# Patient Record
Sex: Female | Born: 1995 | Race: Black or African American | Hispanic: No | Marital: Single | Smoking: Never smoker
Health system: Southern US, Community
[De-identification: ages and names within clinical notes are randomized; demographics above are authoritative.]

## PROBLEM LIST (undated history)

## (undated) ENCOUNTER — Inpatient Hospital Stay (HOSPITAL_COMMUNITY): Payer: Self-pay

## (undated) DIAGNOSIS — A749 Chlamydial infection, unspecified: Secondary | ICD-10-CM

## (undated) DIAGNOSIS — Z789 Other specified health status: Secondary | ICD-10-CM

## (undated) HISTORY — DX: Chlamydial infection, unspecified: A74.9

## (undated) HISTORY — DX: Other specified health status: Z78.9

## (undated) HISTORY — PX: NO PAST SURGERIES: SHX2092

## (undated) HISTORY — PX: INDUCED ABORTION: SHX677

---
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
## 2015-01-01 ENCOUNTER — Telehealth: Payer: Self-pay | Admitting: *Deleted

## 2015-01-01 NOTE — Telephone Encounter (Signed)
Patient has question about nasal pump. 4:58 Call to patient- she has a cold and wants to use nasal spray- active ingredient- Oxymetazoline. Per Dr Jodi Mourning- he called the pharmacy and same as Afrin. Ok to use for 72 hours or less. Patient informed.

## 2015-01-12 ENCOUNTER — Encounter (HOSPITAL_COMMUNITY): Payer: Self-pay | Admitting: *Deleted

## 2015-01-12 ENCOUNTER — Inpatient Hospital Stay (HOSPITAL_COMMUNITY)
Admission: AD | Admit: 2015-01-12 | Discharge: 2015-01-12 | Disposition: A | Discharge status: Home / Self Care | Payer: Medicaid Other | Source: Ambulatory Visit | Attending: Obstetrics | Admitting: Obstetrics

## 2015-01-12 ENCOUNTER — Inpatient Hospital Stay (HOSPITAL_COMMUNITY): Payer: Medicaid Other

## 2015-01-12 DIAGNOSIS — Z3A26 26 weeks gestation of pregnancy: Secondary | ICD-10-CM | POA: Insufficient documentation

## 2015-01-12 DIAGNOSIS — W010XXA Fall on same level from slipping, tripping and stumbling without subsequent striking against object, initial encounter: Secondary | ICD-10-CM | POA: Insufficient documentation

## 2015-01-12 DIAGNOSIS — O9A212 Injury, poisoning and certain other consequences of external causes complicating pregnancy, second trimester: Secondary | ICD-10-CM | POA: Diagnosis not present

## 2015-01-12 DIAGNOSIS — W1809XA Striking against other object with subsequent fall, initial encounter: Secondary | ICD-10-CM | POA: Diagnosis not present

## 2015-01-12 DIAGNOSIS — Y929 Unspecified place or not applicable: Secondary | ICD-10-CM | POA: Insufficient documentation

## 2015-01-12 DIAGNOSIS — S3991XA Unspecified injury of abdomen, initial encounter: Secondary | ICD-10-CM | POA: Diagnosis not present

## 2015-01-12 DIAGNOSIS — O26892 Other specified pregnancy related conditions, second trimester: Secondary | ICD-10-CM | POA: Diagnosis present

## 2015-01-12 NOTE — Progress Notes (Signed)
Marlou Porch CNM in to see pt and discuss d/c plan. EFM removed per CNM

## 2015-01-12 NOTE — MAU Provider Note (Signed)
Chief Complaint:  Fall   First Provider Initiated Contact with Patient 01/12/15 0157     HPI: Erin French is a 19 y.o. G2P0010 at [redacted]w[redacted]d who presents to maternity admissions reporting tripping and falling on her abdomen and 12:30 AM while unpacking her car. States she landed directly on her abdomen on the grass. Was concerned because she didn't feel the baby move for the next hour. Positive fetal movement since arriving in MAU. No associated vaginal bleeding or contractions. Reports tenderness of right knee and fingers of right hand since fall. Normal range of motion. Able to ambulate normally.  O+  Past Medical History: Past Medical History  Diagnosis Date  . Chlamydia 4/12  . Medical history non-contributory     Past obstetric history: OB History  Gravida Para Term Preterm AB SAB TAB Ectopic Multiple Living  2    1     0    # Outcome Date GA Lbr Len/2nd Weight Sex Delivery Anes PTL Lv  2 Current           1 AB               Past Surgical History: Past Surgical History  Procedure Laterality Date  . No past surgeries    . Induced abortion       Family History: Family History  Problem Relation Age of Onset  . Hypertension Father   . Hypertension Maternal Grandmother   . Schizophrenia Mother     Social History: Social History  Substance Use Topics  . Smoking status: Never Smoker   . Smokeless tobacco: Never Used  . Alcohol Use: No    Allergies: No Known Allergies  Meds:  Prescriptions prior to admission  Medication Sig Dispense Refill Last Dose  . butalbital-acetaminophen-caffeine (FIORICET) 50-325-40 MG per tablet Take 1-2 tablets by mouth every 6 (six) hours as needed for headache. 45 tablet 5 Past Month at Unknown time  . Prenat w/o A-FeCbn-DSS-FA-DHA (CITRANATAL HARMONY) 30-1-260 MG CAPS Take 1 tablet by mouth at bedtime. 30 capsule 12 01/11/2015 at Unknown time  . fluconazole (DIFLUCAN) 100 MG tablet Take 1 tablet (100 mg total) by mouth once. Repeat dose  in 48-72 hour. 3 tablet 0   . metroNIDAZOLE (FLAGYL) 500 MG tablet Take 1 tablet (500 mg total) by mouth 2 (two) times daily. 14 tablet 0   . ondansetron (ZOFRAN) 4 MG tablet Take 1 tablet (4 mg total) by mouth daily as needed. 30 tablet 1   . Prenatal Vit-Fe Fumarate-FA (PRENATAL VITAMINS PLUS PO) Take by mouth.   Taking  . terconazole (TERAZOL 7) 0.4 % vaginal cream Place 1 applicator vaginally at bedtime. 45 g 0     I have reviewed patient's Past Medical Hx, Surgical Hx, Family Hx, Social Hx, medications and allergies.   ROS:  Review of Systems  Gastrointestinal: Negative for abdominal pain.  Genitourinary: Negative for vaginal discharge.       Negative for leaking of fluid. Positive fetal movement  Musculoskeletal:       Right knee and right hand pain  Neurological: Negative for dizziness.    Physical Exam   Patient Vitals for the past 24 hrs:  BP Temp Pulse Resp Height Weight  01/12/15 0123 137/79 mmHg 98.3 F (36.8 C) 106 18 5\' 5"  (1.651 m) 142 lb 12.8 oz (64.774 kg)   Constitutional: Well-developed, well-nourished female in no acute distress.  Cardiovascular: normal rate Respiratory: normal effort GI: Abd soft, non-tender, gravid appropriate for gestational age. No  bruising or abrasions. MS: Right knee and fingers of right hand mildly tender. No edema, normal ROM Neurologic: Alert and oriented x 4.  GU: Deferred    FHT:  Baseline 150 , moderate variability, accelerations present, no decelerations Contractions: None   Labs: No results found for this or any previous visit (from the past 24 hour(s)).  Imaging:  No evidence of abruption. AFI normal.   MAU Course: OB Limited to evaluate placenta previa.  Discussed fall on abd, [place], FHR tracing w/ Dr. Ruthann Cancer. D/C pt w/ abruption precautions.   MDM: 19 year-old female at 26.[redacted] weeks gestation S/P fall w/ out evidence of abruption. FHR reassuring for gestational age.   Assessment: 1. Traumatic injury during  pregnancy in second trimester     Plan: Discharge home in stable condition.  Abruption precautions. Preterm labor precautions and fetal kick counts Comfort measures.      Follow-up Information    Follow up with [facility] On 01/24/2015.   Why:  Routine prenatal visit   Contact information:   [address]  [place] [phone]      Follow up with [facility].   Why:  As needed in emergencies (vaginal bleeding, contractions)   Contact information:   [id number] [place] [phone]        Medication List    STOP taking these medications        fluconazole 100 MG tablet  Commonly known as:  DIFLUCAN     metroNIDAZOLE 500 MG tablet  Commonly known as:  FLAGYL     ondansetron 4 MG tablet  Commonly known as:  ZOFRAN     terconazole 0.4 % vaginal cream  Commonly known as:  TERAZOL 7      TAKE these medications        butalbital-acetaminophen-caffeine 50-325-40 MG tablet  Commonly known as:  FIORICET  Take 1-2 tablets by mouth every 6 (six) hours as needed for headache.     CITRANATAL HARMONY 30-1-260 MG Caps  Take 1 tablet by mouth at bedtime.     PRENATAL VITAMINS PLUS PO  Take by mouth.        [place], CNM 01/12/2015 3:12 AM

## 2015-01-12 NOTE — Discharge Instructions (Signed)
What Do I Need to Know About Injuries During Pregnancy? °Injuries can happen during pregnancy. Minor falls and accidents usually do not harm you or your baby. However, any injury should be reported to your doctor. °WHAT CAN I DO TO PROTECT MYSELF FROM INJURIES? °· Remove rugs and loose objects on the floor. °· Wear comfortable shoes that have a good grip. Do not wear high-heeled shoes. °· Always wear your seat belt. The lap belt should be below your belly. Always practice safe driving. °· Do not ride on a motorcycle. °· Do not participate in high-impact activities or sports. °· Avoid: °¨ Walking on wet or slippery floors. °¨ Fires. °¨ Starting fires. °¨ Lifting heavy pots of boiling or hot liquids. °¨ Fixing electrical problems. °· Only take medicine as told by your doctor. °· Know your blood type and the blood type of the baby's father. °· Call your local emergency services (911 in the [place] if you are a victim of domestic violence or assault. For help and support, contact the National Domestic Violence Hotline. °WHEN SHOULD I GET HELP RIGHT AWAY? °· You fall on your belly or have any high-impact accident or injury. °· You have been a victim of domestic violence or any kind of violence. °· You have been in a car accident. °· You have bleeding from your vagina. °· Fluid is leaking from your vagina. °· You start to have belly cramping (contractions) or pain. °· You feel weak or pass out (faint). °· You start to throw up (vomit) after an injury. °· You have been burned. °· You have a stiff neck or neck pain. °· You get a headache or have vision problems after an injury. °· You do not feel the baby move or the baby is not moving as much as normal. °  °This information is not intended to replace advice given to you by your health care provider. Make sure you discuss any questions you have with your health care provider. °  °Document Released: 03/14/2010 Document Revised: 03/02/2014 Document Reviewed:  11/16/2012 °Elsevier Interactive Patient Education ©2016 [employer] ° °

## 2015-01-12 NOTE — MAU Note (Signed)
About 0030 I tripped and fell on my stomach. I fell in the grass but onto my stomach. HAve not felt FM since the fall. Denies LOF or bleeding

## 2015-01-12 NOTE — Progress Notes (Signed)
V. Smith CNM in to discuss test results and d/c plan. Written and verbal d/c instructions given and understanding voiced. 

## 2015-01-12 NOTE — Progress Notes (Signed)
V.SMith CNM in to see pt

## 2015-01-22 ENCOUNTER — Other Ambulatory Visit: Payer: Self-pay | Admitting: Certified Nurse Midwife

## 2015-01-24 ENCOUNTER — Ambulatory Visit (INDEPENDENT_AMBULATORY_CARE_PROVIDER_SITE_OTHER): Payer: Medicaid Other | Admitting: Certified Nurse Midwife

## 2015-01-24 ENCOUNTER — Other Ambulatory Visit: Payer: Medicaid Other

## 2015-01-24 VITALS — BP 122/76 | HR 94 | Temp 98.1°F | Wt 147.0 lb

## 2015-01-24 DIAGNOSIS — Z3403 Encounter for supervision of normal first pregnancy, third trimester: Secondary | ICD-10-CM

## 2015-01-24 LAB — POCT URINALYSIS DIPSTICK
Bilirubin, UA: NEGATIVE
GLUCOSE UA: NEGATIVE
Ketones, UA: NEGATIVE
Leukocytes, UA: NEGATIVE
NITRITE UA: NEGATIVE
Protein, UA: NEGATIVE
RBC UA: 50
SPEC GRAV UA: 1.01
UROBILINOGEN UA: NEGATIVE
pH, UA: 7

## 2015-01-24 LAB — CBC
HCT: 29.2 % — ABNORMAL LOW (ref 36.0–46.0)
HEMOGLOBIN: 9.6 g/dL — AB (ref 12.0–15.0)
MCH: 28.3 pg (ref 26.0–34.0)
MCHC: 32.9 g/dL (ref 30.0–36.0)
MCV: 86.1 fL (ref 78.0–100.0)
MPV: 8.8 fL (ref 8.6–12.4)
Platelets: 295 10*3/uL (ref 150–400)
RBC: 3.39 MIL/uL — AB (ref 3.87–5.11)
RDW: 13.7 % (ref 11.5–15.5)
WBC: 5.1 10*3/uL (ref 4.0–10.5)

## 2015-01-24 LAB — RPR

## 2015-01-24 NOTE — Progress Notes (Signed)
Subjective:    Erin French is a 19 y.o. female being seen today for her obstetrical visit. She is at [redacted]w[redacted]d gestation. Patient reports no complaints. Fetal movement: normal.  Problem List Items Addressed This Visit    None    Visit Diagnoses    Encounter for supervision of normal first pregnancy in third trimester    -  Primary    Relevant Orders    POCT urinalysis dipstick (Completed)    Glucose Tolerance, 2 Hours w/1 Hour    CBC    HIV antibody    RPR      There are no active problems to display for this patient.  Objective:    BP 122/76 mmHg  Pulse 94  Temp(Src) 98.1 F (36.7 C)  Wt 147 lb (66.679 kg)  LMP 07/04/2014 FHT:  150 BPM  Uterine Size: size equals dates  Presentation: cephalic     Assessment:    Pregnancy @ [redacted]w[redacted]d weeks   Doing well.  Plan:     labs reviewed, problem list updated Consent signed. GBS planning TDAP offered  Rhogam given for RH negative Pediatrician: discussed. Infant feeding: plans to breastfeed. Maternity leave: discussed. Cigarette smoking: never smoked. Orders Placed This Encounter  Procedures  . Glucose Tolerance, 2 Hours w/1 Hour  . CBC  . HIV antibody  . RPR  . POCT urinalysis dipstick   No orders of the defined types were placed in this encounter.   Follow up in 2 Weeks.

## 2015-01-25 ENCOUNTER — Other Ambulatory Visit: Payer: Self-pay | Admitting: Certified Nurse Midwife

## 2015-01-25 DIAGNOSIS — O99013 Anemia complicating pregnancy, third trimester: Secondary | ICD-10-CM

## 2015-01-25 LAB — GLUCOSE TOLERANCE, 2 HOURS W/ 1HR
GLUCOSE, 2 HOUR: 82 mg/dL (ref 70–139)
Glucose, 1 hour: 93 mg/dL (ref 70–170)
Glucose, Fasting: 72 mg/dL (ref 65–99)

## 2015-01-25 LAB — HIV ANTIBODY (ROUTINE TESTING W REFLEX): HIV: NONREACTIVE

## 2015-01-25 MED ORDER — FERRALET 90 90-1 MG PO TABS: 1.0000 | ORAL_TABLET | Freq: Every day | ORAL | Status: DC

## 2015-01-27 ENCOUNTER — Emergency Department (HOSPITAL_COMMUNITY)
Admission: EM | Admit: 2015-01-27 | Discharge: 2015-01-27 | Disposition: A | Discharge status: Home / Self Care | Payer: No Typology Code available for payment source | Attending: Emergency Medicine | Admitting: Emergency Medicine

## 2015-01-27 ENCOUNTER — Encounter (HOSPITAL_COMMUNITY): Payer: Self-pay | Admitting: Emergency Medicine

## 2015-01-27 DIAGNOSIS — Y9389 Activity, other specified: Secondary | ICD-10-CM | POA: Diagnosis not present

## 2015-01-27 DIAGNOSIS — Y92410 Unspecified street and highway as the place of occurrence of the external cause: Secondary | ICD-10-CM | POA: Diagnosis not present

## 2015-01-27 DIAGNOSIS — S3991XA Unspecified injury of abdomen, initial encounter: Secondary | ICD-10-CM | POA: Diagnosis not present

## 2015-01-27 DIAGNOSIS — S3992XA Unspecified injury of lower back, initial encounter: Secondary | ICD-10-CM | POA: Insufficient documentation

## 2015-01-27 DIAGNOSIS — Y998 Other external cause status: Secondary | ICD-10-CM | POA: Diagnosis not present

## 2015-01-27 DIAGNOSIS — R1031 Right lower quadrant pain: Secondary | ICD-10-CM

## 2015-01-27 DIAGNOSIS — Z79899 Other long term (current) drug therapy: Secondary | ICD-10-CM | POA: Diagnosis not present

## 2015-01-27 DIAGNOSIS — M545 Low back pain, unspecified: Secondary | ICD-10-CM

## 2015-01-27 DIAGNOSIS — Z8619 Personal history of other infectious and parasitic diseases: Secondary | ICD-10-CM | POA: Diagnosis not present

## 2015-01-27 LAB — CBC WITH DIFFERENTIAL/PLATELET
BASOS ABS: 0 10*3/uL (ref 0.0–0.1)
Basophils Relative: 0 %
EOS ABS: 0 10*3/uL (ref 0.0–0.7)
EOS PCT: 0 %
HCT: 29.3 % — ABNORMAL LOW (ref 36.0–46.0)
HEMOGLOBIN: 9.5 g/dL — AB (ref 12.0–15.0)
LYMPHS PCT: 32 %
Lymphs Abs: 1.7 10*3/uL (ref 0.7–4.0)
MCH: 27.8 pg (ref 26.0–34.0)
MCHC: 32.4 g/dL (ref 30.0–36.0)
MCV: 85.7 fL (ref 78.0–100.0)
Monocytes Absolute: 0.3 10*3/uL (ref 0.1–1.0)
Monocytes Relative: 6 %
NEUTROS PCT: 62 %
Neutro Abs: 3.2 10*3/uL (ref 1.7–7.7)
PLATELETS: 298 10*3/uL (ref 150–400)
RBC: 3.42 MIL/uL — AB (ref 3.87–5.11)
RDW: 12.4 % (ref 11.5–15.5)
WBC: 5.2 10*3/uL (ref 4.0–10.5)

## 2015-01-27 LAB — COMPREHENSIVE METABOLIC PANEL
ALK PHOS: 94 U/L (ref 38–126)
ALT: 16 U/L (ref 14–54)
AST: 25 U/L (ref 15–41)
Albumin: 3.1 g/dL — ABNORMAL LOW (ref 3.5–5.0)
Anion gap: 7 (ref 5–15)
BUN: 7 mg/dL (ref 6–20)
CHLORIDE: 105 mmol/L (ref 101–111)
CO2: 23 mmol/L (ref 22–32)
CREATININE: 0.72 mg/dL (ref 0.44–1.00)
Calcium: 9.5 mg/dL (ref 8.9–10.3)
GFR calc Af Amer: >60 mL/min (ref >60)
GFR calc non Af Amer: >60 mL/min (ref >60)
GLUCOSE: 92 mg/dL (ref 65–99)
Potassium: 3.6 mmol/L (ref 3.5–5.1)
SODIUM: 135 mmol/L (ref 135–145)
Total Bilirubin: 0.3 mg/dL (ref 0.3–1.2)
Total Protein: 7 g/dL (ref 6.5–8.1)

## 2015-01-27 LAB — ABO/RH: ABO/RH(D): O POS

## 2015-01-27 LAB — LIPASE, BLOOD: Lipase: 25 U/L (ref 11–51)

## 2015-01-27 MED ORDER — LACTATED RINGERS IV BOLUS (SEPSIS)
1000.0000 mL | Freq: Once | INTRAVENOUS | Status: AC
  Administered 2015-01-27: 1000 mL via INTRAVENOUS

## 2015-01-27 MED ORDER — TERBUTALINE SULFATE 1 MG/ML IJ SOLN
0.2500 mg | Freq: Once | INTRAMUSCULAR | Status: AC
Start: 2015-01-27 — End: 2015-01-27
  Administered 2015-01-27: 0.25 mg via SUBCUTANEOUS
  Filled 2015-01-27: qty 1

## 2015-01-27 NOTE — Progress Notes (Signed)
NST reactive with no contractions noted; patient cleared obstetrically; ED MD notified
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
## 2015-01-27 NOTE — ED Notes (Signed)
Sister brought over from peds by [place], Therapist, sports.

## 2015-01-27 NOTE — ED Notes (Signed)
OB RR called on the way

## 2015-01-27 NOTE — ED Notes (Signed)
Rapid OB notified and enroute
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
## 2015-01-28 ENCOUNTER — Encounter (HOSPITAL_COMMUNITY): Payer: Self-pay | Admitting: *Deleted

## 2015-01-28 ENCOUNTER — Inpatient Hospital Stay (HOSPITAL_COMMUNITY): Payer: Medicaid Other

## 2015-01-28 ENCOUNTER — Ambulatory Visit (INDEPENDENT_AMBULATORY_CARE_PROVIDER_SITE_OTHER): Payer: Medicaid Other | Admitting: Certified Nurse Midwife

## 2015-01-28 ENCOUNTER — Inpatient Hospital Stay (HOSPITAL_COMMUNITY)
Admission: AD | Admit: 2015-01-28 | Discharge: 2015-01-28 | Disposition: A | Discharge status: Home / Self Care | Payer: Medicaid Other | Source: Ambulatory Visit | Attending: Obstetrics | Admitting: Obstetrics

## 2015-01-28 VITALS — BP 131/76 | HR 106 | Temp 98.5°F | Wt 149.0 lb

## 2015-01-28 DIAGNOSIS — Z3403 Encounter for supervision of normal first pregnancy, third trimester: Secondary | ICD-10-CM

## 2015-01-28 DIAGNOSIS — O9A213 Injury, poisoning and certain other consequences of external causes complicating pregnancy, third trimester: Secondary | ICD-10-CM | POA: Diagnosis not present

## 2015-01-28 DIAGNOSIS — Z3A28 28 weeks gestation of pregnancy: Secondary | ICD-10-CM | POA: Diagnosis not present

## 2015-01-28 DIAGNOSIS — R109 Unspecified abdominal pain: Secondary | ICD-10-CM | POA: Insufficient documentation

## 2015-01-28 DIAGNOSIS — O26892 Other specified pregnancy related conditions, second trimester: Secondary | ICD-10-CM | POA: Insufficient documentation

## 2015-01-28 DIAGNOSIS — T1490 Injury, unspecified: Secondary | ICD-10-CM

## 2015-01-28 DIAGNOSIS — O26899 Other specified pregnancy related conditions, unspecified trimester: Secondary | ICD-10-CM

## 2015-01-28 DIAGNOSIS — O9989 Other specified diseases and conditions complicating pregnancy, childbirth and the puerperium: Secondary | ICD-10-CM

## 2015-01-28 LAB — KLEIHAUER-BETKE STAIN
# VIALS RHIG: 1
Fetal Cells %: 0 %
Quantitation Fetal Hemoglobin: 0 mL

## 2015-01-28 MED ORDER — VITAFOL FE+ 90-1-200 & 50 MG PO CPPK: 1.0000 | ORAL_CAPSULE | Freq: Every day | ORAL | Status: DC
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
## 2015-01-28 NOTE — MAU Note (Signed)
Urine in lab 
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
## 2015-02-07 ENCOUNTER — Ambulatory Visit (INDEPENDENT_AMBULATORY_CARE_PROVIDER_SITE_OTHER): Payer: Medicaid Other | Admitting: Certified Nurse Midwife

## 2015-02-07 VITALS — BP 116/78 | HR 97 | Temp 98.6°F | Wt 148.0 lb

## 2015-02-07 DIAGNOSIS — J0110 Acute frontal sinusitis, unspecified: Secondary | ICD-10-CM

## 2015-02-07 DIAGNOSIS — J069 Acute upper respiratory infection, unspecified: Secondary | ICD-10-CM

## 2015-02-07 DIAGNOSIS — Z3403 Encounter for supervision of normal first pregnancy, third trimester: Secondary | ICD-10-CM

## 2015-02-07 LAB — POCT URINALYSIS DIPSTICK
Bilirubin, UA: NEGATIVE
GLUCOSE UA: NEGATIVE
Ketones, UA: NEGATIVE
Leukocytes, UA: NEGATIVE
NITRITE UA: NEGATIVE
PH UA: 7
Protein, UA: NEGATIVE
RBC UA: NEGATIVE
UROBILINOGEN UA: NEGATIVE

## 2015-02-07 MED ORDER — HYDROCOD POLST-CPM POLST ER 10-8 MG/5ML PO SUER: 5.0000 mL | Freq: Two times a day (BID) | ORAL | Status: DC

## 2015-02-07 MED ORDER — PSEUDOEPHED-APAP-GUAIFENESIN 30-325-200 MG PO TABS: 2.0000 | ORAL_TABLET | Freq: Two times a day (BID) | ORAL | Status: DC

## 2015-02-07 MED ORDER — AZITHROMYCIN 250 MG PO TABS: ORAL_TABLET | ORAL | Status: DC

## 2015-02-07 NOTE — Progress Notes (Signed)
Subjective:    Erin French is a 19 y.o. female being seen today for her obstetrical visit. She is at [redacted]w[redacted]d gestation. Patient reports no bleeding, no contractions, no cramping, no leaking and URI symptoms: +chills, denies fever, cough and congestion for 3 weeks, + sinus pressure, + coughing with yellow sputum production.  Seems that the URI is getting worse, increased coughing at night, + post nasal drip. Fetal movement: normal.  Problem List Items Addressed This Visit    None    Visit Diagnoses    Encounter for supervision of normal first pregnancy in third trimester    -  Primary    Relevant Orders    POCT urinalysis dipstick (Completed)    URI (upper respiratory infection)        Relevant Medications    Pseudoephed-APAP-Guaifenesin 30-325-200 MG TABS    azithromycin (ZITHROMAX Z-PAK) 250 MG tablet    chlorpheniramine-HYDROcodone (TUSSIONEX PENNKINETIC ER) 10-8 MG/5ML SUER    Acute frontal sinusitis, recurrence not specified        Relevant Medications    Pseudoephed-APAP-Guaifenesin 30-325-200 MG TABS    azithromycin (ZITHROMAX Z-PAK) 250 MG tablet    chlorpheniramine-HYDROcodone (TUSSIONEX PENNKINETIC ER) 10-8 MG/5ML SUER      There are no active problems to display for this patient.  Objective:    BP 116/78 mmHg  Pulse 97  Temp(Src) 98.6 F (37 C)  Wt 148 lb (67.132 kg)  LMP 07/04/2014 FHT:  145-150 BPM  Uterine Size: size equals dates  Presentation: cephalic     Assessment:    Pregnancy @ [redacted]w[redacted]d weeks   URI  Acute Sinusitis  Plan:     labs reviewed, problem list updated Consent signed. GBS planning TDAP offered  Rhogam given for RH negative Pediatrician: discussed. Infant feeding: plans to breastfeed. Maternity leave: discussed. Cigarette smoking: never smoked. Orders Placed This Encounter  Procedures  . POCT urinalysis dipstick   Meds ordered this encounter  Medications  . Pseudoephed-APAP-Guaifenesin 30-325-200 MG TABS    Sig: Take 2 tablets by  mouth every 12 (twelve) hours.    Dispense:  56 each    Refill:  0  . azithromycin (ZITHROMAX Z-PAK) 250 MG tablet    Sig: Take 2 tablets once a day for five days.    Dispense:  10 each    Refill:  0  . chlorpheniramine-HYDROcodone (TUSSIONEX PENNKINETIC ER) 10-8 MG/5ML SUER    Sig: Take 5 mLs by mouth 2 (two) times daily.    Dispense:  140 mL    Refill:  0   Follow up in 2 Weeks.

## 2015-02-07 NOTE — Progress Notes (Signed)
Pt is doing well.

## 2015-02-21 ENCOUNTER — Ambulatory Visit (INDEPENDENT_AMBULATORY_CARE_PROVIDER_SITE_OTHER): Payer: Medicaid Other | Admitting: Certified Nurse Midwife

## 2015-02-21 VITALS — BP 120/79 | HR 107 | Temp 98.4°F | Wt 152.0 lb

## 2015-02-21 DIAGNOSIS — Z3403 Encounter for supervision of normal first pregnancy, third trimester: Secondary | ICD-10-CM

## 2015-02-21 LAB — POCT URINALYSIS DIPSTICK
Bilirubin, UA: NEGATIVE
Blood, UA: 250
Glucose, UA: NEGATIVE
KETONES UA: NEGATIVE
NITRITE UA: NEGATIVE
PH UA: 7
PROTEIN UA: NEGATIVE
Spec Grav, UA: 1.01
Urobilinogen, UA: NEGATIVE

## 2015-02-21 NOTE — Progress Notes (Signed)
Subjective:    Erin French is a 19 y.o. female being seen today for her obstetrical visit. She is at [redacted]w[redacted]d gestation. Patient reports no complaints. Fetal movement: normal.  Problem List Items Addressed This Visit    None    Visit Diagnoses    Encounter for supervision of normal first pregnancy in third trimester    -  Primary    Relevant Orders    POCT urinalysis dipstick      There are no active problems to display for this patient.  Objective:    BP 120/79 mmHg  Pulse 107  Temp(Src) 98.4 F (36.9 C)  Wt 152 lb (68.947 kg)  LMP 07/04/2014 FHT:  135 BPM  Uterine Size: size equals dates  Presentation: cephalic     Assessment:    Pregnancy @ [redacted]w[redacted]d weeks   doing well  Plan:     labs reviewed, problem list updated Consent signed. GBS planning TDAP offered  Rhogam given for RH negative Pediatrician: discussed. Infant feeding: plans to breastfeed. Maternity leave: discussed. Cigarette smoking: never smoked. Orders Placed This Encounter  Procedures  . POCT urinalysis dipstick   No orders of the defined types were placed in this encounter.   Follow up in 2 Weeks.

## 2015-02-24 NOTE — L&D Delivery Note (Signed)
Delivery Note At 8:45 PM a viable female was delivered via Vaginal, Spontaneous Delivery (Presentation: ; Left Occiput Anterior).  APGAR: 8-9, ; weight: 3110 grams .   Placenta status: Intact , .  Cord: 3 vessels with the following complications: None.  Cord pH: none  Anesthesia: None  Episiotomy: None Lacerations: None Suture Repair: none Est. Blood Loss (mL):  350  Mom to postpartum.  Baby to Couplet care / Skin to Skin.  HARPER,CHARLES A 04/12/2015, 9:24 PM

## 2015-03-07 ENCOUNTER — Ambulatory Visit (INDEPENDENT_AMBULATORY_CARE_PROVIDER_SITE_OTHER): Payer: Medicaid Other | Admitting: Certified Nurse Midwife

## 2015-03-07 VITALS — BP 112/77 | HR 99 | Temp 98.6°F | Wt 155.0 lb

## 2015-03-07 DIAGNOSIS — Z3403 Encounter for supervision of normal first pregnancy, third trimester: Secondary | ICD-10-CM

## 2015-03-07 LAB — POCT URINALYSIS DIPSTICK
Bilirubin, UA: NEGATIVE
GLUCOSE UA: NEGATIVE
KETONES UA: NEGATIVE
Nitrite, UA: NEGATIVE
PROTEIN UA: NEGATIVE
SPEC GRAV UA: 1.015
Urobilinogen, UA: NEGATIVE
pH, UA: 7

## 2015-03-07 NOTE — Progress Notes (Signed)
Subjective:    Erin French is a 20 y.o. female being seen today for her obstetrical visit. She is at [redacted]w[redacted]d gestation. Patient reports no complaints. Fetal movement: normal.  Problem List Items Addressed This Visit    None    Visit Diagnoses    Encounter for supervision of normal first pregnancy in third trimester    -  Primary    Relevant Orders    POCT urinalysis dipstick (Completed)      There are no active problems to display for this patient.  Objective:    BP 112/77 mmHg  Pulse 99  Temp(Src) 98.6 F (37 C)  Wt 155 lb (70.308 kg)  LMP 07/04/2014 FHT:  145 BPM  Uterine Size: size equals dates  Presentation: cephalic     Assessment:    Pregnancy @ [redacted]w[redacted]d weeks   Doing well.   Plan:     labs reviewed, problem list updated Consent signed. GBS planning TDAP offered  Rhogam given for RH negative Pediatrician: discussed. Infant feeding: plans to breastfeed. Maternity leave: discussed. Cigarette smoking: never smoked. Orders Placed This Encounter  Procedures  . POCT urinalysis dipstick   No orders of the defined types were placed in this encounter.   Follow up in 2 Weeks.

## 2015-03-22 ENCOUNTER — Ambulatory Visit (INDEPENDENT_AMBULATORY_CARE_PROVIDER_SITE_OTHER): Payer: Medicaid Other | Admitting: Certified Nurse Midwife

## 2015-03-22 VITALS — BP 131/82 | HR 89 | Temp 98.4°F | Wt 159.0 lb

## 2015-03-22 DIAGNOSIS — Z3403 Encounter for supervision of normal first pregnancy, third trimester: Secondary | ICD-10-CM

## 2015-03-22 LAB — CBC WITH DIFFERENTIAL/PLATELET
Basophils Absolute: 0 10*3/uL (ref 0.0–0.1)
Basophils Relative: 0 % (ref 0–1)
EOS ABS: 0 10*3/uL (ref 0.0–0.7)
Eosinophils Relative: 0 % (ref 0–5)
HEMATOCRIT: 31.7 % — AB (ref 36.0–46.0)
HEMOGLOBIN: 10 g/dL — AB (ref 12.0–15.0)
LYMPHS ABS: 1.3 10*3/uL (ref 0.7–4.0)
LYMPHS PCT: 28 % (ref 12–46)
MCH: 27.4 pg (ref 26.0–34.0)
MCHC: 31.5 g/dL (ref 30.0–36.0)
MCV: 86.8 fL (ref 78.0–100.0)
MONOS PCT: 11 % (ref 3–12)
MPV: 9.3 fL (ref 8.6–12.4)
Monocytes Absolute: 0.5 10*3/uL (ref 0.1–1.0)
NEUTROS PCT: 61 % (ref 43–77)
Neutro Abs: 2.9 10*3/uL (ref 1.7–7.7)
Platelets: 324 10*3/uL (ref 150–400)
RBC: 3.65 MIL/uL — ABNORMAL LOW (ref 3.87–5.11)
RDW: 13.6 % (ref 11.5–15.5)
WBC: 4.8 10*3/uL (ref 4.0–10.5)

## 2015-03-22 LAB — POCT URINALYSIS DIPSTICK
BILIRUBIN UA: NEGATIVE
GLUCOSE UA: NEGATIVE
Ketones, UA: NEGATIVE
Nitrite, UA: NEGATIVE
Urobilinogen, UA: NEGATIVE
pH, UA: 7

## 2015-03-22 NOTE — Progress Notes (Signed)
Patient is doing well

## 2015-03-23 LAB — CULTURE, OB URINE
Colony Count: NO GROWTH
Organism ID, Bacteria: NO GROWTH

## 2015-03-24 NOTE — Progress Notes (Signed)
Subjective:    Erin French is a 20 y.o. female being seen today for her obstetrical visit. She is at [redacted]w[redacted]d gestation. Patient reports no complaints. Fetal movement: normal.  Problem List Items Addressed This Visit    None    Visit Diagnoses    Encounter for supervision of normal first pregnancy in third trimester    -  Primary    Relevant Orders    POCT urinalysis dipstick (Completed)    CBC with Differential/Platelet (Completed)    Culture, OB Urine (Completed)      There are no active problems to display for this patient.  Objective:    BP 131/82 mmHg  Pulse 89  Temp(Src) 98.4 F (36.9 C)  Wt 159 lb (72.122 kg)  LMP 07/04/2014 FHT:  150 BPM  Uterine Size: size equals dates  Presentation: cephalic     Assessment:    Pregnancy @ [redacted]w[redacted]d weeks   hematuria  H/O anemia   Plan:     labs reviewed, problem list updated Consent signed. GBS planning TDAP offered  Rhogam given for RH negative Pediatrician: discussed. Infant feeding: plans to breastfeed. Maternity leave: discussed. Cigarette smoking: never smoked. Orders Placed This Encounter  Procedures  . Culture, OB Urine  . CBC with Differential/Platelet  . POCT urinalysis dipstick   No orders of the defined types were placed in this encounter.   Follow up in 1 Week.

## 2015-03-29 ENCOUNTER — Encounter: Payer: Medicaid Other | Admitting: Certified Nurse Midwife

## 2015-04-03 ENCOUNTER — Ambulatory Visit (INDEPENDENT_AMBULATORY_CARE_PROVIDER_SITE_OTHER): Payer: Medicaid Other | Admitting: Certified Nurse Midwife

## 2015-04-03 VITALS — BP 139/87 | HR 103 | Wt 161.0 lb

## 2015-04-03 DIAGNOSIS — Z3403 Encounter for supervision of normal first pregnancy, third trimester: Secondary | ICD-10-CM

## 2015-04-03 LAB — CBC
HEMATOCRIT: 32.1 % — AB (ref 36.0–46.0)
HEMOGLOBIN: 10.2 g/dL — AB (ref 12.0–15.0)
MCH: 27.9 pg (ref 26.0–34.0)
MCHC: 31.8 g/dL (ref 30.0–36.0)
MCV: 87.7 fL (ref 78.0–100.0)
MPV: 9.3 fL (ref 8.6–12.4)
Platelets: 328 10*3/uL (ref 150–400)
RBC: 3.66 MIL/uL — ABNORMAL LOW (ref 3.87–5.11)
RDW: 13.9 % (ref 11.5–15.5)
WBC: 4.8 10*3/uL (ref 4.0–10.5)

## 2015-04-03 LAB — POCT URINALYSIS DIPSTICK
BILIRUBIN UA: NEGATIVE
Blood, UA: 250
Glucose, UA: NEGATIVE
KETONES UA: NEGATIVE
Nitrite, UA: NEGATIVE
Urobilinogen, UA: NEGATIVE
pH, UA: 7

## 2015-04-03 LAB — LACTATE DEHYDROGENASE: LDH: 119 U/L (ref 94–250)

## 2015-04-03 LAB — AST: AST: 21 U/L (ref 12–32)

## 2015-04-03 LAB — ALT: ALT: 11 U/L (ref 5–32)

## 2015-04-03 LAB — CREATININE, SERUM: Creat: 0.83 mg/dL (ref 0.50–1.00)

## 2015-04-03 NOTE — Progress Notes (Signed)
Pt is having some lower pelvic cramping.

## 2015-04-03 NOTE — Addendum Note (Signed)
Addended by: Lewie Loron D on: 04/03/2015 11:34 AM   Modules accepted: Orders

## 2015-04-03 NOTE — Progress Notes (Signed)
Subjective:    Erin French is a 20 y.o. female being seen today for her obstetrical visit. She is at [redacted]w[redacted]d gestation. Patient reports backache, no bleeding, no cramping, no leaking and occasional contractions.  Denies HA or upper gastric pain. Fetal movement: normal.  Problem List Items Addressed This Visit    None    Visit Diagnoses    Encounter for supervision of normal first pregnancy in third trimester    -  Primary    Relevant Orders    Strep B DNA probe    POCT urinalysis dipstick (Completed)    Lactate dehydrogenase    Creatinine, serum    CBC    ALT    AST    Pathologist smear review    Protein / creatinine ratio, urine      There are no active problems to display for this patient.   Objective:    BP 139/87 mmHg  Pulse 103  Wt 161 lb (73.029 kg)  LMP 07/04/2014 FHT: 155 BPM  Uterine Size: size equals dates  Presentations: cephalic  Pelvic Exam:              Dilation: 1cm       Effacement: 50%             Station:  -2    Consistency: soft            Position: posterior     Assessment:    Pregnancy @ [redacted]w[redacted]d weeks   Elevated blood pressures at term.   Plan:    [place] labs today.  Plans for delivery: Vaginal anticipated; labs reviewed; problem list updated Counseling: Consent signed. Infant feeding: plans to breastfeed. Cigarette smoking: never smoked. L&D discussion: symptoms of labor, discussed when to call, discussed what number to call, anesthetic/analgesic options reviewed and delivering clinician:  plans no preference. Postpartum supports and preparation: circumcision discussed and contraception plans discussed.  Follow up in 1 Week.

## 2015-04-03 NOTE — Patient Instructions (Signed)
Group B streptococcus (GBS) is a type of bacteria often found in healthy women. GBS is not the same as the bacteria that causes strep throat. You may have GBS in your vagina, rectum, or bladder. GBS does not spread through sexual contact, but it can be passed to a baby during childbirth. This can be dangerous for your baby. It is not dangerous to you and usually does not cause any symptoms. Your health care provider may test you for GBS when your pregnancy is between 35 and 37 weeks. GBS is dangerous only during birth, so there is no need to test for it earlier. It is possible to have GBS during pregnancy and never pass it to your baby. If your test results are positive for GBS, your health care provider may recommend giving you antibiotic medicine during delivery to make sure your baby stays healthy. RISK FACTORS You are more likely to pass GBS to your baby if:   Your water breaks (ruptured membrane) or you go into labor before 37 weeks.  Your water breaks 18 hours before you deliver.  You passed GBS during a previous pregnancy.  You have a urinary tract infection caused by GBS any time during pregnancy.  You have a fever during labor. SYMPTOMS Most women who have GBS do not have any symptoms. If you have a urinary tract infection caused by GBS, you might have frequent or painful urination and fever. Babies who get GBS usually show symptoms within 7 days of birth. Symptoms may include:   Breathing problems.  Heart and blood pressure problems.  Digestive and kidney problems. DIAGNOSIS Routine screening for GBS is recommended for all pregnant women. A health care provider takes a sample of the fluid in your vagina and rectum with a swab. It is then sent to a lab to be checked for GBS. A sample of your urine may also be checked for the bacteria.  TREATMENT If you test positive for GBS, you may need treatment with an antibiotic medicine during labor. As soon as you go into labor, or as soon as  your membranes rupture, you will get the antibiotic medicine through an IV access. You will continue to get the medicine until after you give birth. You do not need antibiotic medicine if you are having a cesarean delivery.If your baby shows signs or symptoms of GBS after birth, your baby can also be treated with an antibiotic medicine. HOME CARE INSTRUCTIONS   Take all antibiotic medicine as prescribed by your health care provider. Only take medicine as directed.   Continue with prenatal visits and care.   Keep all follow-up appointments.  SEEK MEDICAL CARE IF:   You have pain when you urinate.   You have to urinate frequently.   You have a fever.  SEEK IMMEDIATE MEDICAL CARE IF:   Your membranes rupture.  You go into labor.   This information is not intended to replace advice given to you by your health care provider. Make sure you discuss any questions you have with your health care provider.   Document Released: 05/19/2007 Document Revised: 02/14/2013 Document Reviewed: 12/02/2012 Elsevier Interactive Patient Education 2016 [employer]  

## 2015-04-03 NOTE — Addendum Note (Signed)
Addended by: Lewie Loron D on: 04/03/2015 01:01 PM   Modules accepted: Orders

## 2015-04-04 LAB — PROTEIN / CREATININE RATIO, URINE
Creatinine, Urine: 52 mg/dL (ref 20–320)
Protein Creatinine Ratio: 577 mg/g creat — ABNORMAL HIGH (ref 21–161)
Total Protein, Urine: 30 mg/dL — ABNORMAL HIGH (ref 5–24)

## 2015-04-04 LAB — STREP B DNA PROBE: GBSP: NOT DETECTED

## 2015-04-04 LAB — PATHOLOGIST SMEAR REVIEW

## 2015-04-05 ENCOUNTER — Other Ambulatory Visit: Payer: Self-pay | Admitting: Certified Nurse Midwife

## 2015-04-05 DIAGNOSIS — O1213 Gestational proteinuria, third trimester: Secondary | ICD-10-CM

## 2015-04-05 LAB — CULTURE, OB URINE
Colony Count: NO GROWTH
Organism ID, Bacteria: NO GROWTH

## 2015-04-05 MED ORDER — LABETALOL HCL 200 MG PO TABS: 200.0000 mg | ORAL_TABLET | Freq: Two times a day (BID) | ORAL | Status: DC

## 2015-04-10 ENCOUNTER — Ambulatory Visit (INDEPENDENT_AMBULATORY_CARE_PROVIDER_SITE_OTHER): Payer: Medicaid Other | Admitting: Certified Nurse Midwife

## 2015-04-10 VITALS — BP 127/89 | HR 98 | Temp 99.0°F | Wt 160.0 lb

## 2015-04-10 DIAGNOSIS — Z3403 Encounter for supervision of normal first pregnancy, third trimester: Secondary | ICD-10-CM

## 2015-04-10 LAB — POCT URINALYSIS DIPSTICK
BILIRUBIN UA: NEGATIVE
GLUCOSE UA: NEGATIVE
Ketones, UA: NEGATIVE
NITRITE UA: NEGATIVE
PH UA: 7
Protein, UA: NEGATIVE
Spec Grav, UA: <=1.005
Urobilinogen, UA: NEGATIVE

## 2015-04-10 NOTE — Progress Notes (Signed)
Subjective:    Erin French is a 20 y.o. female being seen today for her obstetrical visit. She is at [redacted]w[redacted]d gestation. Patient reports no bleeding, no leaking and occasional contractions. Fetal movement: normal.  Problem List Items Addressed This Visit    None    Visit Diagnoses    Encounter for supervision of normal first pregnancy in third trimester    -  Primary    Relevant Orders    POCT urinalysis dipstick (Completed)      There are no active problems to display for this patient.   Objective:    BP 127/89 mmHg  Pulse 98  Temp(Src) 99 F (37.2 C)  Wt 160 lb (72.576 kg)  LMP 07/04/2014 FHT: 145 BPM  Uterine Size: size equals dates  Presentations: cephalic  Pelvic Exam:              Dilation: 3cm       Effacement: 50%             Station:  -2    Consistency: soft            Position: middle     Assessment:    Pregnancy @ [redacted]w[redacted]d weeks   hx of proteinuria  Doing well  Plan:   Plans for delivery: Vaginal anticipated; labs reviewed; problem list updated Counseling: Consent signed. Infant feeding: plans to breastfeed. Cigarette smoking: never smoked. L&D discussion: symptoms of labor, discussed when to call, discussed what number to call, anesthetic/analgesic options reviewed and delivering clinician:  plans no preference. Postpartum supports and preparation: circumcision discussed and contraception plans discussed.  Follow up IOL scheduled for 04/16/15.

## 2015-04-11 ENCOUNTER — Telehealth (HOSPITAL_COMMUNITY): Payer: Self-pay | Admitting: *Deleted

## 2015-04-11 NOTE — Telephone Encounter (Signed)
Preadmission screen  

## 2015-04-12 ENCOUNTER — Inpatient Hospital Stay (HOSPITAL_COMMUNITY)
Admission: AD | Admit: 2015-04-12 | Discharge: 2015-04-14 | DRG: 775 | Disposition: A | Discharge status: Home / Self Care | Payer: Medicaid Other | Source: Ambulatory Visit | Attending: Obstetrics | Admitting: Obstetrics

## 2015-04-12 ENCOUNTER — Encounter (HOSPITAL_COMMUNITY): Payer: Self-pay

## 2015-04-12 DIAGNOSIS — D649 Anemia, unspecified: Secondary | ICD-10-CM | POA: Diagnosis not present

## 2015-04-12 DIAGNOSIS — Z3A39 39 weeks gestation of pregnancy: Secondary | ICD-10-CM | POA: Diagnosis not present

## 2015-04-12 DIAGNOSIS — Z8249 Family history of ischemic heart disease and other diseases of the circulatory system: Secondary | ICD-10-CM | POA: Diagnosis not present

## 2015-04-12 DIAGNOSIS — O9081 Anemia of the puerperium: Secondary | ICD-10-CM | POA: Diagnosis not present

## 2015-04-12 LAB — TYPE AND SCREEN
ABO/RH(D): O POS
ANTIBODY SCREEN: NEGATIVE

## 2015-04-12 LAB — CBC
HCT: 34.4 % — ABNORMAL LOW (ref 36.0–46.0)
Hemoglobin: 11.3 g/dL — ABNORMAL LOW (ref 12.0–15.0)
MCH: 28.7 pg (ref 26.0–34.0)
MCHC: 32.8 g/dL (ref 30.0–36.0)
MCV: 87.3 fL (ref 78.0–100.0)
PLATELETS: 378 10*3/uL (ref 150–400)
RBC: 3.94 MIL/uL (ref 3.87–5.11)
RDW: 13.5 % (ref 11.5–15.5)
WBC: 12.7 10*3/uL — AB (ref 4.0–10.5)

## 2015-04-12 LAB — ABO/RH: ABO/RH(D): O POS

## 2015-04-12 MED ORDER — OXYCODONE-ACETAMINOPHEN 5-325 MG PO TABS
1.0000 | ORAL_TABLET | ORAL | Status: DC | PRN
Start: 2015-04-12 — End: 2015-04-14

## 2015-04-12 MED ORDER — FLEET ENEMA 7-19 GM/118ML RE ENEM: 1.0000 | ENEMA | RECTAL | Status: DC | PRN

## 2015-04-12 MED ORDER — LIDOCAINE HCL (PF) 1 % IJ SOLN
30.0000 mL | INTRAMUSCULAR | Status: DC | PRN
  Filled 2015-04-12: qty 30

## 2015-04-12 MED ORDER — ONDANSETRON HCL 4 MG/2ML IJ SOLN
4.0000 mg | INTRAMUSCULAR | Status: DC | PRN
Start: 2015-04-12 — End: 2015-04-14

## 2015-04-12 MED ORDER — LACTATED RINGERS IV SOLN
2.5000 [IU]/h | INTRAVENOUS | Status: DC
  Administered 2015-04-12: 2.5 [IU]/h via INTRAVENOUS

## 2015-04-12 MED ORDER — IBUPROFEN 600 MG PO TABS
600.0000 mg | ORAL_TABLET | Freq: Four times a day (QID) | ORAL | Status: DC
  Administered 2015-04-12 – 2015-04-14 (×7): 600 mg via ORAL
  Filled 2015-04-12 (×7): qty 1

## 2015-04-12 MED ORDER — ONDANSETRON HCL 4 MG PO TABS: 4.0000 mg | ORAL_TABLET | ORAL | Status: DC | PRN

## 2015-04-12 MED ORDER — ACETAMINOPHEN 325 MG PO TABS: 650.0000 mg | ORAL_TABLET | ORAL | Status: DC | PRN

## 2015-04-12 MED ORDER — OXYCODONE-ACETAMINOPHEN 5-325 MG PO TABS: 2.0000 | ORAL_TABLET | ORAL | Status: DC | PRN

## 2015-04-12 MED ORDER — ONDANSETRON HCL 4 MG/2ML IJ SOLN: 4.0000 mg | Freq: Four times a day (QID) | INTRAMUSCULAR | Status: DC | PRN

## 2015-04-12 MED ORDER — OXYTOCIN BOLUS FROM INFUSION
500.0000 mL | INTRAVENOUS | Status: DC
  Administered 2015-04-12: 500 mL via INTRAVENOUS

## 2015-04-12 MED ORDER — OXYTOCIN 10 UNIT/ML IJ SOLN
INTRAVENOUS | Status: AC
  Administered 2015-04-12: 2.5 [IU]/h via INTRAVENOUS
  Filled 2015-04-12: qty 4

## 2015-04-12 MED ORDER — LANOLIN HYDROUS EX OINT: TOPICAL_OINTMENT | CUTANEOUS | Status: DC | PRN

## 2015-04-12 MED ORDER — CITRIC ACID-SODIUM CITRATE 334-500 MG/5ML PO SOLN: 30.0000 mL | ORAL | Status: DC | PRN

## 2015-04-12 MED ORDER — OXYCODONE-ACETAMINOPHEN 5-325 MG PO TABS: 1.0000 | ORAL_TABLET | ORAL | Status: DC | PRN

## 2015-04-12 MED ORDER — LIDOCAINE HCL (PF) 1 % IJ SOLN
INTRAMUSCULAR | Status: AC
  Filled 2015-04-12: qty 30

## 2015-04-12 MED ORDER — INFLUENZA VAC SPLIT QUAD 0.5 ML IM SUSY
0.5000 mL | PREFILLED_SYRINGE | INTRAMUSCULAR | Status: DC
  Filled 2015-04-12: qty 0.5

## 2015-04-12 MED ORDER — SIMETHICONE 80 MG PO CHEW: 80.0000 mg | CHEWABLE_TABLET | ORAL | Status: DC | PRN

## 2015-04-12 MED ORDER — ZOLPIDEM TARTRATE 5 MG PO TABS: 5.0000 mg | ORAL_TABLET | Freq: Every evening | ORAL | Status: DC | PRN

## 2015-04-12 MED ORDER — TETANUS-DIPHTH-ACELL PERTUSSIS 5-2.5-18.5 LF-MCG/0.5 IM SUSP
0.5000 mL | Freq: Once | INTRAMUSCULAR | Status: AC
Start: 2015-04-13 — End: 2015-04-13
  Administered 2015-04-13: 0.5 mL via INTRAMUSCULAR
  Filled 2015-04-12: qty 0.5

## 2015-04-12 MED ORDER — SENNOSIDES-DOCUSATE SODIUM 8.6-50 MG PO TABS
2.0000 | ORAL_TABLET | ORAL | Status: DC
  Administered 2015-04-12 – 2015-04-13 (×2): 2 via ORAL
  Filled 2015-04-12 (×2): qty 2

## 2015-04-12 MED ORDER — PRENATAL MULTIVITAMIN CH
1.0000 | ORAL_TABLET | Freq: Every day | ORAL | Status: DC
  Administered 2015-04-13 – 2015-04-14 (×2): 1 via ORAL
  Filled 2015-04-12 (×2): qty 1

## 2015-04-12 MED ORDER — LACTATED RINGERS IV SOLN
INTRAVENOUS | Status: DC
  Administered 2015-04-12: 20:00:00 via INTRAVENOUS

## 2015-04-12 MED ORDER — WITCH HAZEL-GLYCERIN EX PADS: 1.0000 "application " | MEDICATED_PAD | CUTANEOUS | Status: DC | PRN

## 2015-04-12 MED ORDER — BENZOCAINE-MENTHOL 20-0.5 % EX AERO
1.0000 "application " | INHALATION_SPRAY | CUTANEOUS | Status: DC | PRN
  Administered 2015-04-13 (×2): 1 via TOPICAL
  Filled 2015-04-12 (×2): qty 56

## 2015-04-12 MED ORDER — OXYTOCIN 10 UNIT/ML IJ SOLN: 2.5000 [IU]/h | INTRAMUSCULAR | Status: DC | PRN

## 2015-04-12 MED ORDER — DIBUCAINE 1 % RE OINT: 1.0000 "application " | TOPICAL_OINTMENT | RECTAL | Status: DC | PRN

## 2015-04-12 MED ORDER — LACTATED RINGERS IV SOLN: 500.0000 mL | INTRAVENOUS | Status: DC | PRN

## 2015-04-12 MED ORDER — DIPHENHYDRAMINE HCL 25 MG PO CAPS: 25.0000 mg | ORAL_CAPSULE | Freq: Four times a day (QID) | ORAL | Status: DC | PRN

## 2015-04-12 NOTE — Progress Notes (Signed)
Erin French is a 20 y.o. G2P0010 at [redacted]w[redacted]d by LMP admitted for active labor  Subjective:   Objective: BP 158/87 mmHg  Pulse 102  LMP 07/04/2014      FHT:  FHR: 140 bpm, variability: moderate,  accelerations:  Present,  decelerations:  Absent UC:   regular, every 2-3 minutes SVE:   Dilation: 10 Effacement (%): 100 Station: +1 Exam by:: Lily Lovings, RN  Labs: Lab Results  Component Value Date   WBC 4.8 04/03/2015   HGB 10.2* 04/03/2015   HCT 32.1* 04/03/2015   MCV 87.7 04/03/2015   PLT 328 04/03/2015    Assessment / Plan: Spontaneous labor, progressing normally  Labor: Progressing normally Preeclampsia:  n/a Fetal Wellbeing:  Category I Pain Control:  Labor support without medications I/D:  n/a Anticipated MOD:  NSVD  Juaquin Ludington A 04/12/2015, 8:26 PM

## 2015-04-12 NOTE — H&P (Signed)
Erin French is a 20 y.o. female presenting for UC's. Maternal Medical History:  Reason for admission: Contractions.   Contractions: Frequency: regular.    Fetal activity: Perceived fetal activity is normal.   Last perceived fetal movement was within the past hour.    Prenatal complications: no prenatal complications Prenatal Complications - Diabetes: none.    OB History    Gravida Para Term Preterm AB TAB SAB Ectopic Multiple Living   2    1     0     Past Medical History  Diagnosis Date  . Chlamydia 4/12  . Medical history non-contributory    Past Surgical History  Procedure Laterality Date  . No past surgeries    . Induced abortion     Family History: family history includes Hypertension in her father and maternal grandmother; Schizophrenia in her mother. Social History:  reports that she has never smoked. She has never used smokeless tobacco. She reports that she does not drink alcohol or use illicit drugs.   Prenatal Transfer Tool  Maternal Diabetes: No Genetic Screening: Normal Maternal Ultrasounds/Referrals: Normal Fetal Ultrasounds or other Referrals:  None Maternal Substance Abuse:  No Significant Maternal Medications:  None Significant Maternal Lab Results:  None Other Comments:  None  Review of Systems  All other systems reviewed and are negative.   Dilation: 10 Effacement (%): 100 Station: +1 Exam by:: L. Volanda Napoleon, RN Blood pressure 158/87, pulse 102, last menstrual period 07/04/2014. Maternal Exam:  Uterine Assessment: Contraction frequency is regular.   Abdomen: Patient reports no abdominal tenderness. Fetal presentation: vertex  Cervix: Cervix evaluated by digital exam.     Physical Exam  Nursing note and vitals reviewed. Constitutional: She appears well-developed and well-nourished.  HENT:  Head: Normocephalic and atraumatic.  Eyes: Conjunctivae are normal. Pupils are equal, round, and reactive to light.  Neck: Normal range of motion.  Neck supple.  Cardiovascular: Normal rate and regular rhythm.   Respiratory: Effort normal and breath sounds normal.  GI: Soft.  Skin: Skin is warm and dry.  Psychiatric: She has a normal mood and affect. Her behavior is normal. Judgment and thought content normal.    Prenatal labs: ABO, Rh: --/--/O POS (12/04 2024) Antibody: NEG (09/07 1107) Rubella: 23.40 (09/07 1107) RPR: NON REAC (12/01 1120)  HBsAg: NEGATIVE (09/07 1107)  HIV: NONREACTIVE (12/01 1120)  GBS: NOT DETECTED (02/08 1206)   Assessment/Plan: 39.3 weeks.  Active labor.  Admit,   HARPER,CHARLES A 04/12/2015, 8:23 PM

## 2015-04-13 LAB — CBC
HEMATOCRIT: 29 % — AB (ref 36.0–46.0)
Hemoglobin: 9.4 g/dL — ABNORMAL LOW (ref 12.0–15.0)
MCH: 27.8 pg (ref 26.0–34.0)
MCHC: 32.4 g/dL (ref 30.0–36.0)
MCV: 85.8 fL (ref 78.0–100.0)
PLATELETS: 306 10*3/uL (ref 150–400)
RBC: 3.38 MIL/uL — ABNORMAL LOW (ref 3.87–5.11)
RDW: 13.4 % (ref 11.5–15.5)
WBC: 11.8 10*3/uL — AB (ref 4.0–10.5)

## 2015-04-13 LAB — RPR: RPR: NONREACTIVE

## 2015-04-13 NOTE — Progress Notes (Signed)
Post Partum Day 1 Subjective: no complaints  Objective: Blood pressure 136/87, pulse 89, temperature 98.4 F (36.9 C), temperature source Oral, resp. rate 18, height 5\' 5"  (1.651 m), weight 161 lb (73.029 kg), last menstrual period 07/04/2014, SpO2 98 %, unknown if currently breastfeeding.  Physical Exam:  General: alert and no distress Lochia: appropriate Uterine Fundus: firm Incision: none DVT Evaluation: No evidence of DVT seen on physical exam.   Recent Labs  04/12/15 2010 04/13/15 0556  HGB 11.3* 9.4*  HCT 34.4* 29.0*    Assessment/Plan: Plan for discharge tomorrow   LOS: 1 day   Erin French A 04/13/2015, 7:41 AM

## 2015-04-13 NOTE — Lactation Note (Signed)
This note was copied from a baby's chart. Lactation Consultation Note  Patient Name: Erin French [id number] Date: 04/13/2015 Reason for consult: Initial assessment Infant is 20 hours old seen by [facility] for initial assessment. Baby was born at [redacted]w[redacted]d & weighed 6+13.2# at birth. This is moms first baby. Mom was holding baby skin-to-skin when [facility] entered. Mom stated that BF was difficult in the beginning but that it went well over night. Mom stated baby had not fed since 5:45am; showed mom how to do hand expression (drops were seen right away) & encouraged mom to try latching baby but he would not wake up to BF. Provided mom with spoons & discussed how she can hand express into spoon & offer to baby if he is having problems latching. Discussed feeding cues and encouraged mom to keep him skin-to-skin and try waking him to continue to try if it goes long than 3.5hrs. Provided mom with hand pump- showed her how to use it & discussed cleaning. Provided LC booklet, BF resources, & feeding log; discussed O/P services, LC number, & support groups. Mom has [facility] & plans to return to work/ school later so encouraged mom to talk with [facility] about DEBP for later. Discussed BF pages in Baby & Me booklet; reviewed BF positions, latch tips, sore nipple & engorgement prevention and care, and milk storage guidelines. Mom reports no other questions or concerns at this time. Encouraged mom to ask for LC at a future feeding to assess/ help with latch as needed.  Maternal Data Has patient been taught Hand Expression?: Yes (drops seen right away) Does the patient have breastfeeding experience prior to this delivery?: No  Feeding Feeding Type: Breast Fed  LATCH Score/Interventions Latch: Too sleepy or reluctant, no latch achieved, no sucking elicited. Intervention(s): Skin to skin;Teach feeding cues  Audible Swallowing: None Intervention(s): Skin to skin;Hand expression  Type of Nipple: Everted at rest and after  stimulation  Comfort (Breast/Nipple): Soft / non-tender     Hold (Positioning): Assistance needed to correctly position infant at breast and maintain latch. Intervention(s): Breastfeeding basics reviewed;Position options;Skin to skin  LATCH Score: 5  Lactation Tools Discussed/Used WIC Program: Yes Pump Review: Setup, frequency, and cleaning;Milk Storage   Consult Status Consult Status: Follow-up Date: 04/14/15 Follow-up type: In-patient    Yvonna Alanis 04/13/2015, 12:08 PM

## 2015-04-14 MED ORDER — IBUPROFEN 600 MG PO TABS: 600.0000 mg | ORAL_TABLET | Freq: Four times a day (QID) | ORAL | Status: DC | PRN

## 2015-04-14 MED ORDER — FUSION PLUS PO CAPS: 1.0000 | ORAL_CAPSULE | Freq: Every day | ORAL | Status: DC

## 2015-04-14 MED ORDER — OXYCODONE-ACETAMINOPHEN 5-325 MG PO TABS: 1.0000 | ORAL_TABLET | ORAL | Status: DC | PRN

## 2015-04-14 NOTE — Discharge Summary (Signed)
Obstetric Discharge Summary Reason for Admission: onset of labor Prenatal Procedures: ultrasound Intrapartum Procedures: spontaneous vaginal delivery Postpartum Procedures: none Complications-Operative and Postpartum: none HEMOGLOBIN  Date Value Ref Range Status  04/13/2015 9.4* 12.0 - 15.0 g/dL Final   HCT  Date Value Ref Range Status  04/13/2015 29.0* 36.0 - 46.0 % Final    Physical Exam:  General: alert and no distress Lochia: appropriate Uterine Fundus: firm Incision: none DVT Evaluation: No evidence of DVT seen on physical exam.  Discharge Diagnoses: Term Pregnancy-delivered  Discharge Information: Date: 04/14/2015 Activity: pelvic rest Diet: routine Medications: PNV, Ibuprofen, Colace, Iron and Percocet Condition: stable Instructions: refer to practice specific booklet Discharge to: home Follow-up Information    Follow up with [name], MD In 2 weeks.   Specialty:  Obstetrics and Gynecology   Contact information:   [address] [place] [address] [phone]       Newborn Data: Live born female  Birth Weight: 6 lb 13.7 oz (3110 g) APGAR: 8, 9  Home with mother.  Erin French A 04/14/2015, 5:39 AM

## 2015-04-14 NOTE — Lactation Note (Signed)
This note was copied from a baby's chart. Lactation Consultation Note  Mother's breasts are filling and tender.  Nipples very sore.  L side more than right.  L has crack and is bleeding. Latched baby on R nipple.  Sucks and swallows observed but mother very sore and baby needs more depth. Applied #24NS and baby relatched and mother felt less pinching during feeding.  Baby breastfed for approx 30 min on R side. Mother feels L side is too sore to latch and too sore to pump at this time. Helped mother hand express good flow of colostrum from R side.  Spoon feed approx 5 ml of colostrum to baby with teach back. Provided mother with 2nd hand pump and suggest she post pump 4-5 times a day after latching and give volume back to baby or hand express. Reviewed milk storage and suggest slow flow nipple once volume  Recommend mother wear gels and then alternate with shells for soreness. Reviewed engorgement care and monitoring voids/stools.   Patient Name: Erin French [id number] Date: 04/14/2015 Reason for consult: Follow-up assessment   Maternal Data    Feeding Feeding Type: Breast Fed Length of feed: 30 min  LATCH Score/Interventions Latch: Grasps breast easily, tongue down, lips flanged, rhythmical sucking.  Audible Swallowing: Spontaneous and intermittent  Type of Nipple: Everted at rest and after stimulation  Comfort (Breast/Nipple): Engorged, cracked, bleeding, large blisters, severe discomfort  Problem noted: Filling;Cracked, bleeding, blisters, bruises Interventions (Filling): Frequent nursing;Hand pump Interventions  (Cracked/bleeding/bruising/blister): Expressed breast milk to nipple;Hand pump  Hold (Positioning): Assistance needed to correctly position infant at breast and maintain latch.  LATCH Score: 7  Lactation Tools Discussed/Used     Consult Status Consult Status: Complete    Carlye Grippe 04/14/2015, 12:42 PM

## 2015-04-14 NOTE — Progress Notes (Signed)
Post Partum Day 2 Subjective: no complaints  Objective: Blood pressure 138/87, pulse 92, temperature 97.6 F (36.4 C), temperature source Axillary, resp. rate 18, height 5\' 5"  (1.651 m), weight 161 lb (73.029 kg), last menstrual period 07/04/2014, SpO2 100 %, unknown if currently breastfeeding.  Physical Exam:  General: alert and no distress Lochia: appropriate Uterine Fundus: firm Incision: none DVT Evaluation: No evidence of DVT seen on physical exam.   Recent Labs  04/12/15 2010 04/13/15 0556  HGB 11.3* 9.4*  HCT 34.4* 29.0*    Assessment/Plan: Doing well.  Anemia.  Clinically stable.  Iron Rx. Discharge home   LOS: 2 days   HARPER,CHARLES A 04/14/2015, 5:31 AM

## 2015-04-16 ENCOUNTER — Other Ambulatory Visit: Payer: Self-pay | Admitting: Certified Nurse Midwife

## 2015-04-16 ENCOUNTER — Inpatient Hospital Stay (HOSPITAL_COMMUNITY): Admission: RE | Admit: 2015-04-16 | Payer: Medicaid Other | Source: Ambulatory Visit

## 2015-05-01 ENCOUNTER — Ambulatory Visit (INDEPENDENT_AMBULATORY_CARE_PROVIDER_SITE_OTHER): Payer: Medicaid Other | Admitting: Certified Nurse Midwife

## 2015-05-01 ENCOUNTER — Encounter: Payer: Self-pay | Admitting: Certified Nurse Midwife

## 2015-05-02 ENCOUNTER — Encounter: Payer: Self-pay | Admitting: Certified Nurse Midwife

## 2015-05-02 NOTE — Progress Notes (Signed)
Patient ID: Erin French, female   DOB: October 21, 1995, 20 y.o.   MRN: [id number]  Subjective:     Erin French is a 20 y.o. female who presents for a postpartum visit. She is 2 weeks postpartum following a spontaneous vaginal delivery. I have fully reviewed the prenatal and intrapartum course. The delivery was at 81 gestational weeks. Outcome: spontaneous vaginal delivery. Anesthesia: none. Postpartum course has been normal. Baby's course has been normal. Baby is feeding by breast. Bleeding thin lochia, brown and red. Bowel function is normal. Bladder function is normal. Patient is not sexually active. Contraception method is abstinence. Postpartum depression screening: negative.  Tobacco, alcohol and substance abuse history reviewed.  Adult immunizations reviewed including TDAP, rubella and varicella.  The following portions of the patient's history were reviewed and updated as appropriate: allergies, current medications, past family history, past medical history, past social history, past surgical history and problem list.  Review of Systems Pertinent items noted in HPI and remainder of comprehensive ROS otherwise negative.   Objective:    BP 141/82 mmHg  Pulse 103  Temp(Src) 98.2 F (36.8 C)  Wt 138 lb (62.596 kg)  Breastfeeding? Yes  General:  alert, cooperative and no distress   Breasts:  inspection negative, no nipple discharge or bleeding, no masses or nodularity palpable  Lungs: clear to auscultation bilaterally  Heart:  regular rate and rhythm, S1, S2 normal, no murmur, click, rub or gallop  Abdomen: soft, non-tender; bowel sounds normal; no masses,  no organomegaly   Vulva:  not evaluated  Vagina: not evaluated  Cervix:  not evaluated  Corpus: not examined  Adnexa:  not evaluated  Rectal Exam: Not performed.          50% of 15 min visit spent on counseling and coordination of care.  Assessment:     normal 2 week postpartum exam. Pap smear not done at today's visit.    Contraception counseling  Plan:    1. Contraception: abstinence 2. Discussed contraception options: ?LARK vs OCPs.  3. Follow up in: 4 weeks or as needed.  2hr GTT for h/o GDM/screening for DM q 3 yrs per ADA recommendations Preconception counseling provided Healthy lifestyle practices reviewed

## 2015-05-07 ENCOUNTER — Telehealth: Payer: Self-pay | Admitting: *Deleted

## 2015-05-07 NOTE — Telephone Encounter (Signed)
Patient is interested in the Nexplanon for contraception. Patient needs to come into office to sign paperwork so we can order her nexplanon. Attempted to contact the patient and left message for patient to call the office.

## 2015-05-10 NOTE — Telephone Encounter (Signed)
Patient advised of procedure for Nexplanon ordering per her insurance. Patient to come by office to fill out paperwork. Paperwork to be faxed as soon as patient fills it out. Patient to be scheduled for appointment as soon as the device is received. Patient verbalized understanding of plan.

## 2016-02-18 ENCOUNTER — Ambulatory Visit: Payer: Medicaid Other | Admitting: Certified Nurse Midwife

## 2016-02-18 NOTE — Progress Notes (Signed)
Exam not done.

## 2016-04-04 ENCOUNTER — Encounter (HOSPITAL_COMMUNITY): Payer: Self-pay | Admitting: *Deleted

## 2016-04-04 ENCOUNTER — Inpatient Hospital Stay (HOSPITAL_COMMUNITY)
Admission: AD | Admit: 2016-04-04 | Discharge: 2016-04-04 | Disposition: A | Discharge status: Home / Self Care | Payer: Medicaid Other | Source: Ambulatory Visit | Attending: Obstetrics and Gynecology | Admitting: Obstetrics and Gynecology

## 2016-04-04 DIAGNOSIS — M5431 Sciatica, right side: Secondary | ICD-10-CM | POA: Insufficient documentation

## 2016-04-04 DIAGNOSIS — Z79899 Other long term (current) drug therapy: Secondary | ICD-10-CM | POA: Insufficient documentation

## 2016-04-04 LAB — URINALYSIS, ROUTINE W REFLEX MICROSCOPIC
Bilirubin Urine: NEGATIVE
GLUCOSE, UA: NEGATIVE mg/dL
KETONES UR: NEGATIVE mg/dL
Nitrite: NEGATIVE
PROTEIN: 30 mg/dL — AB
Specific Gravity, Urine: 1.017 (ref 1.005–1.030)
pH: 5 (ref 5.0–8.0)

## 2016-04-04 LAB — POCT PREGNANCY, URINE: Preg Test, Ur: NEGATIVE

## 2016-04-04 MED ORDER — IBUPROFEN 600 MG PO TABS
600.0000 mg | ORAL_TABLET | Freq: Once | ORAL | Status: AC
  Administered 2016-04-04: 600 mg via ORAL
  Filled 2016-04-04: qty 1

## 2016-04-04 MED ORDER — CYCLOBENZAPRINE HCL 5 MG PO TABS: 5.0000 mg | ORAL_TABLET | Freq: Three times a day (TID) | ORAL | 0 refills | Status: DC | PRN

## 2016-04-04 NOTE — MAU Note (Signed)
Having pain R buttocks that goes down front of thigh down to top of R foot. 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

## 2016-04-04 NOTE — Progress Notes (Signed)
Marie Williams CNM in earlier to discuss d/c plan. Written and verbal d/c instructions given and understanding voiced. 

## 2016-04-04 NOTE — Discharge Instructions (Signed)
Sciatica Introduction Sciatica is pain, numbness, weakness, or tingling along your sciatic nerve. The sciatic nerve starts in the lower back and goes down the back of each leg. Sciatica happens when this nerve is pinched or has pressure put on it. Sciatica usually goes away on its own or with treatment. Sometimes, sciatica may keep coming back (recur). Follow these instructions at home: Medicines  Take over-the-counter and prescription medicines only as told by your doctor.  Do not drive or use heavy machinery while taking prescription pain medicine. Managing pain  If directed, put ice on the affected area.  Put ice in a plastic bag.  Place a towel between your skin and the bag.  Leave the ice on for 20 minutes, 2-3 times a day.  After icing, apply heat to the affected area before you exercise or as often as told by your doctor. Use the heat source that your doctor tells you to use, such as a moist heat pack or a heating pad.  Place a towel between your skin and the heat source.  Leave the heat on for 20-30 minutes.  Remove the heat if your skin turns bright red. This is especially important if you are unable to feel pain, heat, or cold. You may have a greater risk of getting burned. Activity  Return to your normal activities as told by your doctor. Ask your doctor what activities are safe for you.  Avoid activities that make your sciatica worse.  Take short rests during the day. Rest in a lying or standing position. This is usually better than sitting to rest.  When you rest for a long time, do some physical activity or stretching between periods of rest.  Avoid sitting for a long time without moving. Get up and move around at least one time each hour.  Exercise and stretch regularly, as told by your doctor.  Do not lift anything that is heavier than 10 lb (4.5 kg) while you have symptoms of sciatica.  Avoid lifting heavy things even when you do not have symptoms.  Avoid  lifting heavy things over and over.  When you lift objects, always lift in a way that is safe for your body. To do this, you should:  Bend your knees.  Keep the object close to your body.  Avoid twisting. General instructions  Use good posture.  Avoid leaning forward when you are sitting.  Avoid hunching over when you are standing.  Stay at a healthy weight.  Wear comfortable shoes that support your feet. Avoid wearing high heels.  Avoid sleeping on a mattress that is too soft or too hard. You might have less pain if you sleep on a mattress that is firm enough to support your back.  Keep all follow-up visits as told by your doctor. This is important. Contact a doctor if:  You have pain that:  Wakes you up when you are sleeping.  Gets worse when you lie down.  Is worse than the pain you have had in the past.  Lasts longer than 4 weeks.  You lose weight for without trying. Get help right away if:  You cannot control when you pee (urinate) or poop (have a bowel movement).  You have weakness in any of these areas and it gets worse.  Lower back.  Lower belly (pelvis).  Butt (buttocks).  Legs.  You have redness or swelling of your back.  You have a burning feeling when you pee. This information is not intended to  replace advice given to you by your health care provider. Make sure you discuss any questions you have with your health care provider. Document Released: 11/19/2007 Document Revised: 07/18/2015 Document Reviewed: 10/19/2014  2017 Elsevier

## 2016-04-04 NOTE — MAU Provider Note (Signed)
Chief Complaint:  Back Pain   First Provider Initiated Contact with Patient 04/04/16 0242      HPI: Erin French is a 21 y.o. [id number] who presents to maternity admissions reporting pain which starts at right sciatic joint and runs down her leg.  Worse while standing at work.  Has never had it before.  No Gynecological complaints. She reports vaginal bleeding, vaginal itching/burning, urinary symptoms, h/a, dizziness, n/v, or fever/chills.    Back Pain  This is a new problem. The current episode started today. The problem occurs intermittently. The problem has been waxing and waning since onset. The pain is present in the sacro-iliac. The quality of the pain is described as shooting. The pain radiates to the right thigh. The pain is severe. The pain is the same all the time. The symptoms are aggravated by standing and twisting. Stiffness is present all day. Associated symptoms include leg pain. Pertinent negatives include no chest pain, dysuria, fever, headaches, numbness, paresis, pelvic pain, perianal numbness, tingling or weakness. She has tried nothing for the symptoms.   RN note: Having pain R buttocks that goes down front of thigh down to top of R foot. 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   Past Medical History: Past Medical History:  Diagnosis Date  . Chlamydia 4/12  . Medical history non-contributory     Past obstetric history: OB History  Gravida Para Term Preterm AB Living  2 1 1   1 1   SAB TAB Ectopic Multiple Live Births        0 1    # Outcome Date GA Lbr Len/2nd Weight Sex Delivery Anes PTL Lv  2 Term 04/12/15 [redacted]w[redacted]d / 00:41 6 lb 13.7 oz (3.11 kg) M Vag-Spont None  LIV  1 AB               Past Surgical History: Past Surgical History:  Procedure Laterality Date  . INDUCED ABORTION    . NO PAST SURGERIES      Family History: Family History  Problem Relation Age of Onset  . Hypertension Father   . Schizophrenia Mother   . Hypertension  Maternal Grandmother     Social History: Social History  Substance Use Topics  . Smoking status: Never Smoker  . Smokeless tobacco: Never Used  . Alcohol use No    Allergies: No Known Allergies  Meds:  Prescriptions Prior to Admission  Medication Sig Dispense Refill Last Dose  . Iron-FA-B Cmp-C-Biot-Probiotic (FUSION PLUS) CAPS Take 1 capsule by mouth daily before breakfast. 30 capsule 5 04/03/2016 at Unknown time  . Prenat-FePoly-Metf-FA-DHA-DSS (VITAFOL FE+) 90-1-200 & 50 MG CPPK Take 1 tablet by mouth daily. 60 each 6 04/03/2016 at Unknown time  . ibuprofen (ADVIL,MOTRIN) 600 MG tablet Take 1 tablet (600 mg total) by mouth every 6 (six) hours as needed for mild pain or moderate pain. 30 tablet 5 Taking    I have reviewed patient's Past Medical Hx, Surgical Hx, Family Hx, Social Hx, medications and allergies.  ROS:  Review of Systems  Constitutional: Negative for fever.  Cardiovascular: Negative for chest pain.  Genitourinary: Negative for dysuria and pelvic pain.  Musculoskeletal: Positive for back pain.  Neurological: Negative for tingling, weakness, numbness and headaches.   Other systems negative     Physical Exam  Patient Vitals for the past 24 hrs:  BP Temp Pulse Resp Height Weight  04/04/16 0213 143/87 98.8 F (37.1 C) 84 18 5\' 4"  (1.626 m) 126 lb (57.2  kg)   Constitutional: Well-developed, well-nourished female in no acute distress.  Cardiovascular: normal rate and rhythm, no ectopy audible, S1 & S2 heard, no murmur Respiratory: normal effort, no distress. Lungs CTAB with no wheezes or crackles GI: Abd soft, non-tender.  Nondistended.  No rebound, No guarding.  Bowel Sounds audible  MS: Extremities nontender, no edema, normal ROM   Somewhat tender over Right SI joint        Moves legs equally and with good strength.  No decrease in sensation Neurologic: Alert and oriented x 4.   Grossly nonfocal. GU: Neg CVAT. Skin:  Warm and Dry Psych:  Affect  appropriate.  PELVIC EXAM: Not indicated   Labs: --/--/O POS, O POS (02/17 2010) Results for orders placed or performed during the hospital encounter of 04/04/16 (from the past 24 hour(s))  Urinalysis, Routine w reflex microscopic     Status: Abnormal   Collection Time: 04/04/16  2:25 AM  Result Value Ref Range   Color, Urine YELLOW YELLOW   APPearance HAZY (A) CLEAR   Specific Gravity, Urine 1.017 1.005 - 1.030   pH 5.0 5.0 - 8.0   Glucose, UA NEGATIVE NEGATIVE mg/dL   Hgb urine dipstick SMALL (A) NEGATIVE   Bilirubin Urine NEGATIVE NEGATIVE   Ketones, ur NEGATIVE NEGATIVE mg/dL   Protein, ur 30 (A) NEGATIVE mg/dL   Nitrite NEGATIVE NEGATIVE   Leukocytes, UA SMALL (A) NEGATIVE   RBC / HPF 6-30 0 - 5 RBC/hpf   WBC, UA TOO NUMEROUS TO COUNT 0 - 5 WBC/hpf   Bacteria, UA RARE (A) NONE SEEN   Squamous Epithelial / LPF 0-5 (A) NONE SEEN   Mucous PRESENT    Hyaline Casts, UA PRESENT   Pregnancy, urine POC     Status: None   Collection Time: 04/04/16  3:07 AM  Result Value Ref Range   Preg Test, Ur NEGATIVE NEGATIVE    Imaging:  No results found.  MAU Course/MDM: I have ordered labs as follows: UA Imaging ordered: none Results reviewed.  Discussed sciatica. Since there is some spasm, will utilize small dose of Flexeril along with ibuprofen.  May need Physical Therapy if persists.  Pt stable at time of discharge.  Assessment: Sciatica of right side - Plan: Discharge patient   Plan: Discharge home Recommend keep legs and back flexible. Use ibuprofen and add flexeril only for spasm. Driving precautions reviewed Rx sent for Flexeril  for spasm   Encouraged to return here or to other Urgent Care/ED if she develops worsening of symptoms, increase in pain, fever, or other concerning symptoms.   Hansel Feinstein CNM, MSN Certified Nurse-Midwife 04/04/2016 2:42 AM

## 2016-08-27 ENCOUNTER — Ambulatory Visit: Payer: Self-pay | Admitting: Obstetrics

## 2017-05-04 ENCOUNTER — Ambulatory Visit: Payer: Self-pay | Admitting: Certified Nurse Midwife

## 2018-04-11 ENCOUNTER — Ambulatory Visit (INDEPENDENT_AMBULATORY_CARE_PROVIDER_SITE_OTHER): Payer: Self-pay

## 2018-04-11 ENCOUNTER — Encounter: Payer: Self-pay | Admitting: Obstetrics

## 2018-04-11 VITALS — BP 119/73 | HR 93 | Wt 126.8 lb

## 2018-04-11 DIAGNOSIS — Z3201 Encounter for pregnancy test, result positive: Secondary | ICD-10-CM

## 2018-04-11 DIAGNOSIS — N912 Amenorrhea, unspecified: Secondary | ICD-10-CM

## 2018-04-11 LAB — POCT URINE PREGNANCY: Preg Test, Ur: POSITIVE — AB

## 2018-04-11 NOTE — Progress Notes (Signed)
Pt is here for UPT to confirm pregnancy. UPT positive, pt states LMP was around Christmas. Pt instructed to make appointment for on or around 05/04/18 for New OB visit. Pt verbalized understanding. Prenatal vitamin and safe OTC med list given.

## 2018-05-11 ENCOUNTER — Encounter: Payer: Self-pay | Admitting: Family Medicine

## 2018-06-07 ENCOUNTER — Other Ambulatory Visit: Payer: Self-pay

## 2018-06-07 ENCOUNTER — Ambulatory Visit (INDEPENDENT_AMBULATORY_CARE_PROVIDER_SITE_OTHER): Payer: Medicaid Other | Admitting: Advanced Practice Midwife

## 2018-06-07 ENCOUNTER — Other Ambulatory Visit (HOSPITAL_COMMUNITY)
Admission: RE | Admit: 2018-06-07 | Discharge: 2018-06-07 | Disposition: A | Discharge status: Home / Self Care | Payer: Medicaid Other | Source: Ambulatory Visit | Attending: Advanced Practice Midwife | Admitting: Advanced Practice Midwife

## 2018-06-07 ENCOUNTER — Encounter: Payer: Self-pay | Admitting: Advanced Practice Midwife

## 2018-06-07 VITALS — BP 115/76 | HR 96 | Wt 136.5 lb

## 2018-06-07 DIAGNOSIS — Z3482 Encounter for supervision of other normal pregnancy, second trimester: Secondary | ICD-10-CM

## 2018-06-07 DIAGNOSIS — Z3492 Encounter for supervision of normal pregnancy, unspecified, second trimester: Secondary | ICD-10-CM

## 2018-06-07 DIAGNOSIS — Z3490 Encounter for supervision of normal pregnancy, unspecified, unspecified trimester: Secondary | ICD-10-CM

## 2018-06-07 DIAGNOSIS — Z3A15 15 weeks gestation of pregnancy: Secondary | ICD-10-CM

## 2018-06-07 DIAGNOSIS — Z3A19 19 weeks gestation of pregnancy: Secondary | ICD-10-CM

## 2018-06-07 NOTE — Progress Notes (Signed)
Subjective:   Erin French is a 23 y.o. G4P1021 at [redacted]w[redacted]d by LMP being seen today for her first obstetrical visit.  Her obstetrical history is significant for NSVD at term and has Encounter for supervision of normal pregnancy, unspecified, unspecified trimester on their problem list.. Patient does intend to breast feed. Pregnancy history fully reviewed.  Patient reports no complaints.  HISTORY: OB History  Gravida Para Term Preterm AB Living  4 1 1  0 2 1  SAB TAB Ectopic Multiple Live Births  0 0 0 0 1    # Outcome Date GA Lbr Len/2nd Weight Sex Delivery Anes PTL Lv  4 Current           3 Term 04/12/15 [redacted]w[redacted]d / 00:41 3110 g M Vag-Spont None  LIV     Name: Pita,BOY Debi     Apgar1: 8  Apgar5: 9  2 AB           1 AB            Past Medical History:  Diagnosis Date  . Chlamydia 4/12  . Medical history non-contributory    Past Surgical History:  Procedure Laterality Date  . INDUCED ABORTION    . NO PAST SURGERIES     Family History  Problem Relation Age of Onset  . Hypertension Father   . Schizophrenia Mother   . Hypertension Maternal Grandmother    Social History   Tobacco Use  . Smoking status: Never Smoker  . Smokeless tobacco: Never Used  Substance Use Topics  . Alcohol use: Not Currently    Alcohol/week: 0.0 standard drinks    Comment: Last drink December 2019  . Drug use: No   No Known Allergies Current Outpatient Medications on File Prior to Visit  Medication Sig Dispense Refill  . Prenatal MV-Min-FA-Omega-3 (PRENATAL GUMMIES/DHA & FA) 0.4-32.5 MG CHEW Chew by mouth.     No current facility-administered medications on file prior to visit.      Exam   Vitals:   06/07/18 1427  BP: 115/76  Pulse: 96  Weight: 61.9 kg   Fetal Heart Rate (bpm): 160  Uterus:     Pelvic Exam: Perineum: no hemorrhoids, normal perineum   Vulva: normal external genitalia, no lesions   Vagina:  normal mucosa, normal discharge   Cervix: no lesions and normal,  pap smear done.    Adnexa: normal adnexa and no mass, fullness, tenderness   Bony Pelvis: average  System: General: well-developed, well-nourished female in no acute distress   Breast:  normal appearance, no masses or tenderness   Skin: normal coloration and turgor, no rashes   Neurologic: oriented, normal, negative, normal mood   Extremities: normal strength, tone, and muscle mass, ROM of all joints is normal   HEENT PERRLA, extraocular movement intact and sclera clear, anicteric   Mouth/Teeth mucous membranes moist, pharynx normal without lesions and dental hygiene good   Neck supple and no masses   Cardiovascular: regular rate and rhythm   Respiratory:  no respiratory distress, normal breath sounds   Abdomen: soft, non-tender; bowel sounds normal; no masses,  no organomegaly     Assessment:   Pregnancy: [id number] Patient Active Problem List   Diagnosis Date Noted  . Encounter for supervision of normal pregnancy, unspecified, unspecified trimester 06/07/2018     Plan:  1. Encounter for supervision of normal pregnancy, antepartum, unspecified gravidity --Anticipatory guidance about next visits/weeks of pregnancy given. --Reviewed safety, visitor policy, reassurance about COVID-19 for  pregnancy at this time. Discussed possible changes to visits, including televisits, that may occur due to COVID-19.  The office remains open if pt needs to be seen and MAU is open 24 hours/day for OB emergencies.   Initial labs drawn. Continue prenatal vitamins. Discussed and offered genetic screening options, including Quad screen/AFP, NIPS testing, and option to decline testing. Benefits/risks/alternatives reviewed. Pt aware that anatomy US is form of genetic screening with lower accuracy in detecting trisomies than blood work.  Pt chooses genetic screening today. NIPS: ordered. Ultrasound discussed; fetal anatomic survey: ordered. Problem list reviewed and updated. The nature of [place] with multiple MDs and other Advanced Practice Providers was explained to patient; also emphasized that residents, students are part of our team. Routine obstetric precautions reviewed. No follow-ups on file.   Fatima Blank, CNM 06/07/18 3:48 PM

## 2018-06-07 NOTE — Progress Notes (Signed)
Pt presents for New OB. Pt has no concerns today.

## 2018-06-09 LAB — AFP TETRA
DIA Mom Value: 0.61
DIA Value (EIA): 110.48 pg/mL
DSR (By Age)    1 IN: 1103
DSR (Second Trimester) 1 IN: 10000
Gestational Age: 15.9 WEEKS
MSAFP Mom: 1.05
MSAFP: 40.3 ng/mL
MSHCG Mom: 2.19
MSHCG: 102783 m[IU]/mL
Maternal Age At EDD: 23 yr
Osb Risk: 10000
T18 (By Age): 1:4296 {titer}
Test Results:: NEGATIVE
Weight: 136 [lb_av]
uE3 Mom: 1.67
uE3 Value: 1.5 ng/mL

## 2018-06-09 LAB — OBSTETRIC PANEL, INCLUDING HIV
Antibody Screen: NEGATIVE
Basophils Absolute: 0 10*3/uL (ref 0.0–0.2)
Basos: 0 %
EOS (ABSOLUTE): 0 10*3/uL (ref 0.0–0.4)
Eos: 0 %
HIV Screen 4th Generation wRfx: NONREACTIVE
Hematocrit: 33.9 % — ABNORMAL LOW (ref 34.0–46.6)
Hemoglobin: 11 g/dL — ABNORMAL LOW (ref 11.1–15.9)
Hepatitis B Surface Ag: NEGATIVE
Immature Grans (Abs): 0 10*3/uL (ref 0.0–0.1)
Immature Granulocytes: 0 %
Lymphocytes Absolute: 1.2 10*3/uL (ref 0.7–3.1)
Lymphs: 27 %
MCH: 27.5 pg (ref 26.6–33.0)
MCHC: 32.4 g/dL (ref 31.5–35.7)
MCV: 85 fL (ref 79–97)
Monocytes Absolute: 0.4 10*3/uL (ref 0.1–0.9)
Monocytes: 9 %
Neutrophils Absolute: 2.9 10*3/uL (ref 1.4–7.0)
Neutrophils: 64 %
Platelets: 351 10*3/uL (ref 150–450)
RBC: 4 x10E6/uL (ref 3.77–5.28)
RDW: 12.7 % (ref 11.7–15.4)
RPR Ser Ql: NONREACTIVE
Rh Factor: POSITIVE
Rubella Antibodies, IGG: 16.5 index (ref >0.99)
WBC: 4.6 10*3/uL (ref 3.4–10.8)

## 2018-06-09 LAB — URINE CULTURE, OB REFLEX

## 2018-06-09 LAB — CULTURE, OB URINE

## 2018-06-11 LAB — CERVICOVAGINAL ANCILLARY ONLY
Bacterial vaginitis: POSITIVE — AB
Candida vaginitis: POSITIVE — AB
Chlamydia: NEGATIVE
Neisseria Gonorrhea: NEGATIVE
Trichomonas: NEGATIVE

## 2018-06-14 ENCOUNTER — Encounter: Payer: Self-pay | Admitting: Advanced Practice Midwife

## 2018-06-15 LAB — CYTOLOGY - PAP
Diagnosis: UNDETERMINED — AB
HPV: DETECTED — AB

## 2018-06-20 ENCOUNTER — Telehealth: Payer: Self-pay | Admitting: *Deleted

## 2018-06-20 NOTE — Telephone Encounter (Signed)
Pt called to office for genetic results. Results given including gender.

## 2018-06-21 ENCOUNTER — Encounter: Payer: Self-pay | Admitting: Advanced Practice Midwife

## 2018-06-21 ENCOUNTER — Other Ambulatory Visit: Payer: Self-pay | Admitting: *Deleted

## 2018-06-21 DIAGNOSIS — Z3480 Encounter for supervision of other normal pregnancy, unspecified trimester: Secondary | ICD-10-CM

## 2018-06-21 DIAGNOSIS — N760 Acute vaginitis: Secondary | ICD-10-CM

## 2018-06-21 DIAGNOSIS — B9689 Other specified bacterial agents as the cause of diseases classified elsewhere: Secondary | ICD-10-CM

## 2018-06-21 DIAGNOSIS — B379 Candidiasis, unspecified: Secondary | ICD-10-CM

## 2018-06-21 MED ORDER — TERCONAZOLE 0.4 % VA CREA: 1.0000 | TOPICAL_CREAM | Freq: Every day | VAGINAL | 0 refills | Status: DC

## 2018-06-21 MED ORDER — METRONIDAZOLE 500 MG PO TABS: 500.0000 mg | ORAL_TABLET | Freq: Two times a day (BID) | ORAL | 0 refills | Status: DC

## 2018-06-21 NOTE — Progress Notes (Signed)
Yeast and BV tx sent per order on lab results.

## 2018-06-25 ENCOUNTER — Encounter: Payer: Self-pay | Admitting: Advanced Practice Midwife

## 2018-06-25 DIAGNOSIS — R87610 Atypical squamous cells of undetermined significance on cytologic smear of cervix (ASC-US): Secondary | ICD-10-CM | POA: Insufficient documentation

## 2018-06-25 DIAGNOSIS — R87810 Cervical high risk human papillomavirus (HPV) DNA test positive: Secondary | ICD-10-CM

## 2018-06-27 ENCOUNTER — Telehealth: Payer: Self-pay

## 2018-06-27 DIAGNOSIS — Z3480 Encounter for supervision of other normal pregnancy, unspecified trimester: Secondary | ICD-10-CM

## 2018-06-27 NOTE — Telephone Encounter (Signed)
Called patient to verify access to BP cuff. Patient states she received pack from [facility] but no email. Resent link to patient now. Reviewed with patient how to properly take BP. Reviewed next appointments including location of MFM and virtual visit. Instructed patient to download WebEx for next ROB visit.   Wende Mott, CNM 06/27/18 9:32 AM

## 2018-06-28 ENCOUNTER — Telehealth: Payer: Self-pay | Admitting: Advanced Practice Midwife

## 2018-06-28 NOTE — Telephone Encounter (Signed)
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 [facility] has been unable to reach pt to offer genetic counseling.  Called pt and left message for pt to return call to the office.  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  But, [facility] does offer counseling and partner testing if she wants to call them.

## 2018-06-30 ENCOUNTER — Other Ambulatory Visit: Payer: Self-pay | Admitting: Advanced Practice Midwife

## 2018-06-30 ENCOUNTER — Ambulatory Visit (HOSPITAL_COMMUNITY)
Admission: RE | Admit: 2018-06-30 | Discharge: 2018-06-30 | Disposition: A | Discharge status: Home / Self Care | Payer: Medicaid Other | Source: Ambulatory Visit | Attending: Obstetrics and Gynecology | Admitting: Obstetrics and Gynecology

## 2018-06-30 ENCOUNTER — Other Ambulatory Visit: Payer: Self-pay

## 2018-06-30 DIAGNOSIS — Z363 Encounter for antenatal screening for malformations: Secondary | ICD-10-CM | POA: Diagnosis not present

## 2018-06-30 DIAGNOSIS — Z3490 Encounter for supervision of normal pregnancy, unspecified, unspecified trimester: Secondary | ICD-10-CM | POA: Diagnosis not present

## 2018-06-30 DIAGNOSIS — Z3A19 19 weeks gestation of pregnancy: Secondary | ICD-10-CM | POA: Diagnosis not present

## 2018-06-30 DIAGNOSIS — Z148 Genetic carrier of other disease: Secondary | ICD-10-CM

## 2018-07-05 ENCOUNTER — Ambulatory Visit (INDEPENDENT_AMBULATORY_CARE_PROVIDER_SITE_OTHER): Payer: Medicaid Other | Admitting: Obstetrics

## 2018-07-05 ENCOUNTER — Other Ambulatory Visit: Payer: Self-pay

## 2018-07-05 ENCOUNTER — Encounter: Payer: Self-pay | Admitting: Obstetrics

## 2018-07-05 DIAGNOSIS — Z3A19 19 weeks gestation of pregnancy: Secondary | ICD-10-CM

## 2018-07-05 DIAGNOSIS — Z3480 Encounter for supervision of other normal pregnancy, unspecified trimester: Secondary | ICD-10-CM

## 2018-07-05 DIAGNOSIS — Z3482 Encounter for supervision of other normal pregnancy, second trimester: Secondary | ICD-10-CM

## 2018-07-05 NOTE — Progress Notes (Signed)
Pt presents for webex visit. Pt is [redacted]w[redacted]d. Pt will check bp today when she gets home. Pt has no concerns.

## 2018-07-05 NOTE — Progress Notes (Signed)
Pl[] \  TELEHEALTH VIRTUAL OBSTETRICS PRENATAL VISIT ENCOUNTER NOTE  I connected with Erin French on 07/05/18 at  2:00 PM EDT by WebEx at home and verified that I am speaking with the correct person using two identifiers.   I discussed the limitations, risks, security and privacy concerns of performing an evaluation and management service by telephone and the availability of in person appointments. I also discussed with the patient that there may be a patient responsible charge related to this service. The patient expressed understanding and agreed to proceed. Subjective:  Erin French is a 23 y.o. G4P1021 at [redacted]w[redacted]d being seen today for ongoing prenatal care.  She is currently monitored for the following issues for this low-risk pregnancy and has Encounter for supervision of normal pregnancy, unspecified, unspecified trimester and Atypical squamous cell changes of undetermined significance (ASCUS) on cervical cytology with positive high risk human papilloma virus (HPV) on their problem list.  Patient reports no complaints.  Reports fetal movement. Contractions: Not present. Vag. Bleeding: None.  Movement: Present. Denies any contractions, bleeding or leaking of fluid.   The following portions of the patient's history were reviewed and updated as appropriate: allergies, current medications, past family history, past medical history, past social history, past surgical history and problem list.   Objective:  There were no vitals filed for this visit.  Fetal Status:     Movement: Present     General:  Alert, oriented and cooperative. Patient is in no acute distress.  Respiratory: Normal respiratory effort, no problems with respiration noted  Mental Status: Normal mood and affect. Normal behavior. Normal judgment and thought content.  Rest of physical exam deferred due to type of encounter  Assessment and Plan:  Pregnancy: G4P1021 at [redacted]w[redacted]d 1. Supervision of other normal pregnancy, antepartum  Rx: - Babyscripts Schedule Optimization  Preterm labor symptoms and general obstetric precautions including but not limited to vaginal bleeding, contractions, leaking of fluid and fetal movement were reviewed in detail with the patient. I discussed the assessment and treatment plan with the patient. The patient was provided an opportunity to ask questions and all were answered. The patient agreed with the plan and demonstrated an understanding of the instructions. The patient was advised to call back or seek an in-person office evaluation/go to MAU at [facility] for any urgent or concerning symptoms. Please refer to After Visit Summary for other counseling recommendations.   I provided 10 minutes of face-to-face via WebEx time during this encounter.  Return in about 4 weeks (around 08/02/2018) for [facility]  No future appointments.  Baltazar Najjar, MD [facility] [place] [facility] 07-05-2018

## 2018-08-03 ENCOUNTER — Encounter: Payer: Medicaid Other | Admitting: Obstetrics

## 2018-08-05 ENCOUNTER — Ambulatory Visit (INDEPENDENT_AMBULATORY_CARE_PROVIDER_SITE_OTHER): Payer: Medicaid Other | Admitting: Obstetrics

## 2018-08-05 ENCOUNTER — Encounter: Payer: Self-pay | Admitting: Obstetrics

## 2018-08-05 DIAGNOSIS — Z3492 Encounter for supervision of normal pregnancy, unspecified, second trimester: Secondary | ICD-10-CM

## 2018-08-05 DIAGNOSIS — Z3A24 24 weeks gestation of pregnancy: Secondary | ICD-10-CM

## 2018-08-05 DIAGNOSIS — Z3490 Encounter for supervision of normal pregnancy, unspecified, unspecified trimester: Secondary | ICD-10-CM

## 2018-08-05 NOTE — Progress Notes (Signed)
   TELEHEALTH VIRTUAL OBSTETRICS VISIT ENCOUNTER NOTE  I connected with Erin French on 08/05/18 at 10:00 AM EDT by telephone at home and verified that I am speaking with the correct person using two identifiers.   I discussed the limitations, risks, security and privacy concerns of performing an evaluation and management service by telephone and the availability of in person appointments. I also discussed with the patient that there may be a patient responsible charge related to this service. The patient expressed understanding and agreed to proceed.  Subjective:  Erin French is a 23 y.o. G4P1021 at [redacted]w[redacted]d being followed for ongoing prenatal care.  She is currently monitored for the following issues for this low-risk pregnancy and has Encounter for supervision of normal pregnancy, unspecified, unspecified trimester and Atypical squamous cell changes of undetermined significance (ASCUS) on cervical cytology with positive high risk human papilloma virus (HPV) on their problem list.  Patient reports no complaints. Reports fetal movement. Denies any contractions, bleeding or leaking of fluid.   The following portions of the patient's history were reviewed and updated as appropriate: allergies, current medications, past family history, past medical history, past social history, past surgical history and problem list.   Objective:   General:  Alert, oriented and cooperative.   Mental Status: Normal mood and affect perceived. Normal judgment and thought content.  Rest of physical exam deferred due to type of encounter  Assessment and Plan:  Pregnancy: G4P1021 at [redacted]w[redacted]d There are no diagnoses linked to this encounter. Preterm labor symptoms and general obstetric precautions including but not limited to vaginal bleeding, contractions, leaking of fluid and fetal movement were reviewed in detail with the patient.  I discussed the assessment and treatment plan with the patient. The patient was  provided an opportunity to ask questions and all were answered. The patient agreed with the plan and demonstrated an understanding of the instructions. The patient was advised to call back or seek an in-person office evaluation/go to MAU at [facility] for any urgent or concerning symptoms. Please refer to After Visit Summary for other counseling recommendations.   I provided 10 minutes of non-face-to-face time during this encounter.  Return in about 4 weeks (around 09/02/2018) for ROB, 2 hour OGTT.  Future Appointments  Date Time Provider [place]  09/02/2018  8:15 AM [facility] [place] None  09/02/2018  8:30 AM Lajean Manes, CNM [facility] None    Baltazar Najjar, [place] [facility] [place] [facility] 08-05-2018

## 2018-08-29 ENCOUNTER — Telehealth: Payer: Self-pay

## 2018-08-29 NOTE — Telephone Encounter (Signed)
Return call to pt regarding Fatigue and medication for  HA's Pt advised to check B/P while on phone B/P: 127/74 P: 93  Pt made aware increase water intake, eat more frequent small meals w/ protein Increase rest time when she can.   Tylenol for HA's Rx request would be sent to provider. Apply warm compress to area and lay down in dark room.   Pt voiced understanding of precautions to go to MAU ad will await Rx approval/denial for HA's.

## 2018-08-30 ENCOUNTER — Telehealth: Payer: Self-pay

## 2018-08-30 NOTE — Telephone Encounter (Signed)
TC to pt to make aware medication will not be sent for HA's today per Darrol Poke, CNM Pt urged to discuss management at appt this week on Friday.  Pt was not ava and vm full.

## 2018-09-02 ENCOUNTER — Other Ambulatory Visit: Payer: Medicaid Other

## 2018-09-02 ENCOUNTER — Encounter: Payer: Medicaid Other | Admitting: Certified Nurse Midwife

## 2018-09-06 ENCOUNTER — Other Ambulatory Visit: Payer: Self-pay

## 2018-09-06 ENCOUNTER — Ambulatory Visit (INDEPENDENT_AMBULATORY_CARE_PROVIDER_SITE_OTHER): Payer: Medicaid Other | Admitting: Certified Nurse Midwife

## 2018-09-06 ENCOUNTER — Other Ambulatory Visit: Payer: Medicaid Other

## 2018-09-06 VITALS — BP 134/78 | HR 98 | Wt 148.6 lb

## 2018-09-06 DIAGNOSIS — Z3490 Encounter for supervision of normal pregnancy, unspecified, unspecified trimester: Secondary | ICD-10-CM

## 2018-09-06 DIAGNOSIS — Z3493 Encounter for supervision of normal pregnancy, unspecified, third trimester: Secondary | ICD-10-CM

## 2018-09-06 DIAGNOSIS — Z3A28 28 weeks gestation of pregnancy: Secondary | ICD-10-CM

## 2018-09-06 NOTE — Progress Notes (Signed)
Pt is here for ROB and 2 hr GTT. [redacted]w[redacted]d.

## 2018-09-06 NOTE — Progress Notes (Signed)
Subjective:  Erin French is a 23 y.o. G4P1021 at [redacted]w[redacted]d being seen today for ongoing prenatal care.  She is currently monitored for the following issues for this low-risk pregnancy and has Encounter for supervision of normal pregnancy, unspecified, unspecified trimester and Atypical squamous cell changes of undetermined significance (ASCUS) on cervical cytology with positive high risk human papilloma virus (HPV) on their problem list.  Patient reports no complaints.  Contractions: Not present. Vag. Bleeding: None.  Movement: Present. Denies leaking of fluid.   The following portions of the patient's history were reviewed and updated as appropriate: allergies, current medications, past family history, past medical history, past social history, past surgical history and problem list. Problem list updated.  Objective:   Vitals:   09/06/18 0841  BP: 134/78  Pulse: 98  Weight: 67.4 kg    Fetal Status: Fetal Heart Rate (bpm): 148 Fundal Height: 27 cm Movement: Present     General:  Alert, oriented and cooperative. Patient is in no acute distress.  Skin: Skin is warm and dry. No rash noted.   Cardiovascular: Normal heart rate noted  Respiratory: Normal respiratory effort, no problems with respiration noted  Abdomen: Soft, gravid, appropriate for gestational age. Pain/Pressure: Absent     Pelvic: Vag. Bleeding: None     Cervical exam deferred        Extremities: Normal range of motion.  Edema: Trace  Mental Status: Normal mood and affect. Normal behavior. Normal judgment and thought content.   Urinalysis:      Assessment and Plan:  Pregnancy: G4P1021 at [redacted]w[redacted]d  1. Encounter for supervision of normal pregnancy, antepartum, unspecified gravidity - Glucose Tolerance, 2 Hours w/1 Hour - CBC - RPR - HIV Antibody (routine testing w rflx)  Preterm labor symptoms and general obstetric precautions including but not limited to vaginal bleeding, contractions, leaking of fluid and fetal movement  were reviewed in detail with the patient. Please refer to After Visit Summary for other counseling recommendations.  Return in about 4 weeks (around 10/04/2018).- virtual visit   Julianne Handler, CNM

## 2018-09-07 LAB — CBC
Hematocrit: 31.6 % — ABNORMAL LOW (ref 34.0–46.6)
Hemoglobin: 10.2 g/dL — ABNORMAL LOW (ref 11.1–15.9)
MCH: 28 pg (ref 26.6–33.0)
MCHC: 32.3 g/dL (ref 31.5–35.7)
MCV: 87 fL (ref 79–97)
Platelets: 347 10*3/uL (ref 150–450)
RBC: 3.64 x10E6/uL — ABNORMAL LOW (ref 3.77–5.28)
RDW: 12.3 % (ref 11.7–15.4)
WBC: 4.6 10*3/uL (ref 3.4–10.8)

## 2018-09-07 LAB — RPR: RPR Ser Ql: NONREACTIVE

## 2018-09-07 LAB — GLUCOSE TOLERANCE, 2 HOURS W/ 1HR
Glucose, 1 hour: 104 mg/dL (ref 65–179)
Glucose, 2 hour: 68 mg/dL (ref 65–152)
Glucose, Fasting: 81 mg/dL (ref 65–91)

## 2018-09-07 LAB — HIV ANTIBODY (ROUTINE TESTING W REFLEX): HIV Screen 4th Generation wRfx: NONREACTIVE

## 2018-09-27 ENCOUNTER — Encounter (HOSPITAL_COMMUNITY): Payer: Self-pay

## 2018-09-27 ENCOUNTER — Telehealth: Payer: Self-pay

## 2018-09-27 ENCOUNTER — Inpatient Hospital Stay (HOSPITAL_COMMUNITY)
Admission: AD | Admit: 2018-09-27 | Discharge: 2018-09-27 | Disposition: A | Discharge status: Home / Self Care | Payer: Medicaid Other | Attending: Obstetrics and Gynecology | Admitting: Obstetrics and Gynecology

## 2018-09-27 ENCOUNTER — Other Ambulatory Visit: Payer: Self-pay

## 2018-09-27 DIAGNOSIS — O479 False labor, unspecified: Secondary | ICD-10-CM | POA: Diagnosis not present

## 2018-09-27 DIAGNOSIS — O4703 False labor before 37 completed weeks of gestation, third trimester: Secondary | ICD-10-CM | POA: Diagnosis not present

## 2018-09-27 DIAGNOSIS — Z3A31 31 weeks gestation of pregnancy: Secondary | ICD-10-CM | POA: Insufficient documentation

## 2018-09-27 LAB — URINALYSIS, ROUTINE W REFLEX MICROSCOPIC
Bilirubin Urine: NEGATIVE
Glucose, UA: NEGATIVE mg/dL
Ketones, ur: NEGATIVE mg/dL
Nitrite: NEGATIVE
Protein, ur: NEGATIVE mg/dL
Specific Gravity, Urine: 1.01 (ref 1.005–1.030)
pH: 7 (ref 5.0–8.0)

## 2018-09-27 NOTE — MAU Note (Signed)
Pt states that she thinks she is having ctx's. Pt reports they started at 0900. Pt reports ctx being 20 minutes apart.  Reports +FM  Denies vaginal bleeding or LOF.

## 2018-09-27 NOTE — Discharge Instructions (Signed)
Fetal Movement Counts Patient Name: ________________________________________________ Patient Due Date: ____________________ What is a fetal movement count?  A fetal movement count is the number of times that you feel your baby move during a certain amount of time. This may also be called a fetal kick count. A fetal movement count is recommended for every pregnant woman. You may be asked to start counting fetal movements as early as week 28 of your pregnancy. Pay attention to when your baby is most active. You may notice your baby's sleep and wake cycles. You may also notice things that make your baby move more. You should do a fetal movement count:  When your baby is normally most active.  At the same time each day. A good time to count movements is while you are resting, after having something to eat and drink. How do I count fetal movements? 1. Find a quiet, comfortable area. Sit, or lie down on your side. 2. Write down the date, the start time and stop time, and the number of movements that you felt between those two times. Take this information with you to your health care visits. 3. For 2 hours, count kicks, flutters, swishes, rolls, and jabs. You should feel at least 10 movements during 2 hours. 4. You may stop counting after you have felt 10 movements. 5. If you do not feel 10 movements in 2 hours, have something to eat and drink. Then, keep resting and counting for 1 hour. If you feel at least 4 movements during that hour, you may stop counting. Contact a health care provider if:  You feel fewer than 4 movements in 2 hours.  Your baby is not moving like he or she usually does. Date: ____________ Start time: ____________ Stop time: ____________ Movements: ____________ Date: ____________ Start time: ____________ Stop time: ____________ Movements: ____________ Date: ____________ Start time: ____________ Stop time: ____________ Movements: ____________ Date: ____________ Start time:  ____________ Stop time: ____________ Movements: ____________ Date: ____________ Start time: ____________ Stop time: ____________ Movements: ____________ Date: ____________ Start time: ____________ Stop time: ____________ Movements: ____________ Date: ____________ Start time: ____________ Stop time: ____________ Movements: ____________ Date: ____________ Start time: ____________ Stop time: ____________ Movements: ____________ Date: ____________ Start time: ____________ Stop time: ____________ Movements: ____________ This information is not intended to replace advice given to you by your health care provider. Make sure you discuss any questions you have with your health care provider. Document Released: 03/11/2006 Document Revised: 03/01/2018 Document Reviewed: 03/21/2015 Elsevier Patient Education  2020 [employer] Braxton Hicks Contractions Contractions of the uterus can occur throughout pregnancy, but they are not always a sign that you are in labor. You may have practice contractions called Braxton Hicks contractions. These false labor contractions are sometimes confused with true labor. What are Braxton Hicks contractions? Braxton Hicks contractions are tightening movements that occur in the muscles of the uterus before labor. Unlike true labor contractions, these contractions do not result in opening (dilation) and thinning of the cervix. Toward the end of pregnancy (32-34 weeks), Braxton Hicks contractions can happen more often and may become stronger. These contractions are sometimes difficult to tell apart from true labor because they can be very uncomfortable. You should not feel embarrassed if you go to the hospital with false labor. Sometimes, the only way to tell if you are in true labor is for your health care provider to look for changes in the cervix. The health care provider will do a physical exam and may monitor your contractions. If you   are not in true labor, the exam should show  that your cervix is not dilating and your water has not broken. If there are no other health problems associated with your pregnancy, it is completely safe for you to be sent home with false labor. You may continue to have Braxton Hicks contractions until you go into true labor. How to tell the difference between true labor and false labor True labor  Contractions last 30-70 seconds.  Contractions become very regular.  Discomfort is usually felt in the top of the uterus, and it spreads to the lower abdomen and low back.  Contractions do not go away with walking.  Contractions usually become more intense and increase in frequency.  The cervix dilates and gets thinner. False labor  Contractions are usually shorter and not as strong as true labor contractions.  Contractions are usually irregular.  Contractions are often felt in the front of the lower abdomen and in the groin.  Contractions may go away when you walk around or change positions while lying down.  Contractions get weaker and are shorter-lasting as time goes on.  The cervix usually does not dilate or become thin. Follow these instructions at home:   Take over-the-counter and prescription medicines only as told by your health care provider.  Keep up with your usual exercises and follow other instructions from your health care provider.  Eat and drink lightly if you think you are going into labor.  If Braxton Hicks contractions are making you uncomfortable: ? Change your position from lying down or resting to walking, or change from walking to resting. ? Sit and rest in a tub of warm water. ? Drink enough fluid to keep your urine pale yellow. Dehydration may cause these contractions. ? Do slow and deep breathing several times an hour.  Keep all follow-up prenatal visits as told by your health care provider. This is important. Contact a health care provider if:  You have a fever.  You have continuous pain in  your abdomen. Get help right away if:  Your contractions become stronger, more regular, and closer together.  You have fluid leaking or gushing from your vagina.  You pass blood-tinged mucus (bloody show).  You have bleeding from your vagina.  You have low back pain that you never had before.  You feel your baby's head pushing down and causing pelvic pressure.  Your baby is not moving inside you as much as it used to. Summary  Contractions that occur before labor are called Braxton Hicks contractions, false labor, or practice contractions.  Braxton Hicks contractions are usually shorter, weaker, farther apart, and less regular than true labor contractions. True labor contractions usually become progressively stronger and regular, and they become more frequent.  Manage discomfort from Braxton Hicks contractions by changing position, resting in a warm bath, drinking plenty of water, or practicing deep breathing. This information is not intended to replace advice given to you by your health care provider. Make sure you discuss any questions you have with your health care provider. Document Released: 06/25/2016 Document Revised: 01/22/2017 Document Reviewed: 06/25/2016 Elsevier Patient Education  2020 Elsevier Inc[employer]  

## 2018-09-27 NOTE — MAU Provider Note (Signed)
Chief Complaint:  Contractions   First Provider Initiated Contact with Patient 09/27/18 1541     HPI: Erin French is a 23 y.o. G4P1021 at [redacted]w[redacted]d who presents to maternity admissions reporting contractions. Symptoms started this morning. Reports feeling painful tightening every 20 minutes.  Denies leakage of fluid or vaginal bleeding. Good fetal movement.  Location: abdomen Quality: cramping Severity: 5/10 in pain scale Duration: <1 day Timing: every 20 minutes Modifying factors: none Associated signs and symptoms: none   Past Medical History:  Diagnosis Date  . Chlamydia 4/12   OB History  Gravida Para Term Preterm AB Living  4 1 1   2 1   SAB TAB Ectopic Multiple Live Births        0 1    # Outcome Date GA Lbr Len/2nd Weight Sex Delivery Anes PTL Lv  4 Current           3 Term 04/12/15 [redacted]w[redacted]d / 00:41 3110 g M Vag-Spont None  LIV  2 AB           1 AB            Past Surgical History:  Procedure Laterality Date  . INDUCED ABORTION    . NO PAST SURGERIES     Family History  Problem Relation Age of Onset  . Hypertension Father   . Schizophrenia Mother   . Hypertension Maternal Grandmother    Social History   Tobacco Use  . Smoking status: Never Smoker  . Smokeless tobacco: Never Used  Substance Use Topics  . Alcohol use: Not Currently    Alcohol/week: 0.0 standard drinks    Comment: Last drink December 2019  . Drug use: No   No Known Allergies No medications prior to admission.    I have reviewed patient's Past Medical Hx, Surgical Hx, Family Hx, Social Hx, medications and allergies.   ROS:  Review of Systems  Constitutional: Negative.   Gastrointestinal: Positive for abdominal pain.  Genitourinary: Negative.     Physical Exam   Patient Vitals for the past 24 hrs:  BP Temp Pulse Resp SpO2 Weight  09/27/18 1632 129/66 - 97 - - -  09/27/18 1517 120/65 - 97 - - -  09/27/18 1505 129/71 - (!) 101 - - -  09/27/18 1504 129/71 99.3 F (37.4 C) 99 20 99  % -  09/27/18 1502 - - - - - 69.4 kg    Constitutional: Well-developed, well-nourished female in no acute distress.  Cardiovascular: normal rate & rhythm, no murmur Respiratory: normal effort, lung sounds clear throughout GI: Abd soft, non-tender, gravid appropriate for gestational age. Pos BS x 4 MS: Extremities nontender, no edema, normal ROM Neurologic: Alert and oriented x 4.  GU:    Dilation: Closed Effacement (%): Thick Cervical Position: Posterior Exam by:: Jorje Guild NP  NST:  Baseline: 145 bpm, Variability: Good {> 6 bpm), Accelerations: Reactive and Decelerations: Absent   Labs: Results for orders placed or performed during the hospital encounter of 09/27/18 (from the past 24 hour(s))  Urinalysis, Routine w reflex microscopic     Status: Abnormal   Collection Time: 09/27/18  2:54 PM  Result Value Ref Range   Color, Urine YELLOW YELLOW   APPearance HAZY (A) CLEAR   Specific Gravity, Urine 1.010 1.005 - 1.030   pH 7.0 5.0 - 8.0   Glucose, UA NEGATIVE NEGATIVE mg/dL   Hgb urine dipstick MODERATE (A) NEGATIVE   Bilirubin Urine NEGATIVE NEGATIVE   Ketones, ur NEGATIVE  NEGATIVE mg/dL   Protein, ur NEGATIVE NEGATIVE mg/dL   Nitrite NEGATIVE NEGATIVE   Leukocytes,Ua TRACE (A) NEGATIVE   RBC / HPF 11-20 0 - 5 RBC/hpf   WBC, UA 6-10 0 - 5 WBC/hpf   Bacteria, UA MANY (A) NONE SEEN   Squamous Epithelial / LPF 6-10 0 - 5    Imaging:  No results found.  MAU Course: Orders Placed This Encounter  Procedures  . Culture, OB Urine  . Urinalysis, Routine w reflex microscopic  . Discharge patient   No orders of the defined types were placed in this encounter.   MDM: Reactive NST No regular ctx on monitor Cervix closed/thick U/a with some leuks. No urinary complaints. Will send for urine culture.  Assessment: 1. Braxton Hicks contractions   2. [redacted] weeks gestation of pregnancy     Plan: Discharge home in stable condition.  Preterm Labor precautions and fetal kick  counts Follow-up Information    [facility] Follow up.   Specialty: Obstetrics and Gynecology Why: return for worsening symptoms Contact information: [address] [id number] [place] [phone]          Allergies as of 09/27/2018   No Known Allergies     Medication List    TAKE these medications   Prenatal Gummies/DHA & FA 0.4-32.5 MG Chew Chew by mouth.       Jorje Guild, NP 09/27/2018 10:05 PM

## 2018-09-27 NOTE — Telephone Encounter (Signed)
Pt called and reports she has been having contractions since this morning that are painful. She reports good fetal movement, denies bleeding or LOF. Pt reports the contractions have no slowed down. I advise patient to go to MAU for evaluation of preterm labor, pt verbalized understanding and says she will go now.

## 2018-09-28 LAB — CULTURE, OB URINE: Culture: <10000 — AB

## 2018-10-04 ENCOUNTER — Encounter: Payer: Self-pay | Admitting: Nurse Practitioner

## 2018-10-04 ENCOUNTER — Ambulatory Visit (INDEPENDENT_AMBULATORY_CARE_PROVIDER_SITE_OTHER): Payer: Medicaid Other | Admitting: Nurse Practitioner

## 2018-10-04 DIAGNOSIS — Z3490 Encounter for supervision of normal pregnancy, unspecified, unspecified trimester: Secondary | ICD-10-CM

## 2018-10-04 DIAGNOSIS — Z3493 Encounter for supervision of normal pregnancy, unspecified, third trimester: Secondary | ICD-10-CM

## 2018-10-04 DIAGNOSIS — Z3A32 32 weeks gestation of pregnancy: Secondary | ICD-10-CM

## 2018-10-04 NOTE — Progress Notes (Signed)
   [place] VIRTUAL VIDEO VISIT ENCOUNTER NOTE  Provider location: [facility] [place]   I connected with [name] on 10/04/18 at 10:35 AM EDT by WebEx Video Encounter at home and verified that I am speaking with the correct person using two identifiers.   I discussed the limitations, risks, security and privacy concerns of performing an evaluation and management service virtually and the availability of in person appointments. I also discussed with the patient that there may be a patient responsible charge related to this service. The patient expressed understanding and agreed to proceed. Subjective:  Erin French is a 23 y.o. G4P1021 at [redacted]w[redacted]d being seen today for ongoing prenatal care.  She is currently monitored for the following issues for this low-risk pregnancy and has Encounter for supervision of normal pregnancy, unspecified, unspecified trimester and Atypical squamous cell changes of undetermined significance (ASCUS) on cervical cytology with positive high risk human papilloma virus (HPV) on their problem list.  Patient reports no complaints.  Contractions: Irritability. Vag. Bleeding: None.  Movement: Present. Denies any leaking of fluid.   The following portions of the patient's history were reviewed and updated as appropriate: allergies, current medications, past family history, past medical history, past social history, past surgical history and problem list.   Objective:   Vitals:   10/04/18 0916  BP: 121/74  Pulse: 99  Weight: 153 lb (69.4 kg)    Fetal Status:     Movement: Present     General:  Alert, oriented and cooperative. Patient is in no acute distress.  Respiratory: Normal respiratory effort, no problems with respiration noted  Mental Status: Normal mood and affect. Normal behavior. Normal judgment and thought content.  Rest of physical exam deferred due to type of encounter  Imaging: No results found.   Assessment and Plan:  Pregnancy: [id number] at [redacted]w[redacted]d 1. Encounter for supervision of normal pregnancy, antepartum, unspecified gravidity Doing well - reports no swelling and baby is moving appropriately.  Having occasional contractions.  Preterm labor symptoms and general obstetric precautions including but not limited to vaginal bleeding, contractions, leaking of fluid and fetal movement were reviewed in detail with the patient. I discussed the assessment and treatment plan with the patient. The patient was provided an opportunity to ask questions and all were answered. The patient agreed with the plan and demonstrated an understanding of the instructions. The patient was advised to call back or seek an in-person office evaluation/go to MAU at [facility] for any urgent or concerning symptoms. Please refer to After Visit Summary for other counseling recommendations.   I provided 7 minutes of face-to-face time during this encounter.  Return in about 2 weeks (around 10/18/2018) for vidwo visit.  Future Appointments  Date Time Provider [place]  10/04/2018 10:35 AM [place], Rona Ravens, NP [place] None    [place]ia [place], NP [facility] [place]

## 2018-10-04 NOTE — Progress Notes (Signed)
I connected with  [name] on 10/04/18 by a video enabled telemedicine application and verified that I am speaking with the correct person using two identifiers.   WebEx OB.  Reports no problems today.

## 2018-10-04 NOTE — Patient Instructions (Signed)
Braxton Hicks Contractions Contractions of the uterus can occur throughout pregnancy, but they are not always a sign that you are in labor. You may have practice contractions called Braxton Hicks contractions. These false labor contractions are sometimes confused with true labor. What are Braxton Hicks contractions? Braxton Hicks contractions are tightening movements that occur in the muscles of the uterus before labor. Unlike true labor contractions, these contractions do not result in opening (dilation) and thinning of the cervix. Toward the end of pregnancy (32-34 weeks), Braxton Hicks contractions can happen more often and may become stronger. These contractions are sometimes difficult to tell apart from true labor because they can be very uncomfortable. You should not feel embarrassed if you go to the hospital with false labor. Sometimes, the only way to tell if you are in true labor is for your health care provider to look for changes in the cervix. The health care provider will do a physical exam and may monitor your contractions. If you are not in true labor, the exam should show that your cervix is not dilating and your water has not broken. If there are no other health problems associated with your pregnancy, it is completely safe for you to be sent home with false labor. You may continue to have Braxton Hicks contractions until you go into true labor. How to tell the difference between true labor and false labor True labor  Contractions last 30-70 seconds.  Contractions become very regular.  Discomfort is usually felt in the top of the uterus, and it spreads to the lower abdomen and low back.  Contractions do not go away with walking.  Contractions usually become more intense and increase in frequency.  The cervix dilates and gets thinner. False labor  Contractions are usually shorter and not as strong as true labor contractions.  Contractions are usually irregular.  Contractions  are often felt in the front of the lower abdomen and in the groin.  Contractions may go away when you walk around or change positions while lying down.  Contractions get weaker and are shorter-lasting as time goes on.  The cervix usually does not dilate or become thin. Follow these instructions at home:   Take over-the-counter and prescription medicines only as told by your health care provider.  Keep up with your usual exercises and follow other instructions from your health care provider.  Eat and drink lightly if you think you are going into labor.  If Braxton Hicks contractions are making you uncomfortable: ? Change your position from lying down or resting to walking, or change from walking to resting. ? Sit and rest in a tub of warm water. ? Drink enough fluid to keep your urine pale yellow. Dehydration may cause these contractions. ? Do slow and deep breathing several times an hour.  Keep all follow-up prenatal visits as told by your health care provider. This is important. Contact a health care provider if:  You have a fever.  You have continuous pain in your abdomen. Get help right away if:  Your contractions become stronger, more regular, and closer together.  You have fluid leaking or gushing from your vagina.  You pass blood-tinged mucus (bloody show).  You have bleeding from your vagina.  You have low back pain that you never had before.  You feel your baby's head pushing down and causing pelvic pressure.  Your baby is not moving inside you as much as it used to. Summary  Contractions that occur before labor are   called Braxton Hicks contractions, false labor, or practice contractions.  Braxton Hicks contractions are usually shorter, weaker, farther apart, and less regular than true labor contractions. True labor contractions usually become progressively stronger and regular, and they become more frequent.  Manage discomfort from [facility] contractions  by changing position, resting in a warm bath, drinking plenty of water, or practicing deep breathing. This information is not intended to replace advice given to you by your health care provider. Make sure you discuss any questions you have with your health care provider. Document Released: 06/25/2016 Document Revised: 01/22/2017 Document Reviewed: 06/25/2016 Elsevier Patient Education  2020 [employer].

## 2018-10-18 ENCOUNTER — Encounter: Payer: Self-pay | Admitting: Obstetrics

## 2018-10-18 ENCOUNTER — Ambulatory Visit (INDEPENDENT_AMBULATORY_CARE_PROVIDER_SITE_OTHER): Payer: Medicaid Other | Admitting: Obstetrics

## 2018-10-18 DIAGNOSIS — Z3A34 34 weeks gestation of pregnancy: Secondary | ICD-10-CM

## 2018-10-18 DIAGNOSIS — Z3490 Encounter for supervision of normal pregnancy, unspecified, unspecified trimester: Secondary | ICD-10-CM

## 2018-10-18 DIAGNOSIS — Z3493 Encounter for supervision of normal pregnancy, unspecified, third trimester: Secondary | ICD-10-CM

## 2018-10-18 NOTE — Progress Notes (Signed)
   TELEHEALTH OBSTETRICS PRENATAL VIRTUAL VIDEO VISIT ENCOUNTER NOTE  Provider location: [facility] [facility] [place]   I connected with Erin French on 10/18/18 at 10:00 AM EDT by WebEx OB MyChart Video Encounter at home and verified that I am speaking with the correct person using two identifiers.   I discussed the limitations, risks, security and privacy concerns of performing an evaluation and management service virtually and the availability of in person appointments. I also discussed with the patient that there may be a patient responsible charge related to this service. The patient expressed understanding and agreed to proceed. Subjective:  Erin French is a 23 y.o. G4P1021 at [redacted]w[redacted]d being seen today for ongoing prenatal care.  She is currently monitored for the following issues for this low-risk pregnancy and has Encounter for supervision of normal pregnancy, unspecified, unspecified trimester and Atypical squamous cell changes of undetermined significance (ASCUS) on cervical cytology with positive high risk human papilloma virus (HPV) on their problem list.  Patient reports headache.  Contractions: Irritability. Vag. Bleeding: None.  Movement: Present. Denies any leaking of fluid.   The following portions of the patient's history were reviewed and updated as appropriate: allergies, current medications, past family history, past medical history, past social history, past surgical history and problem list.   Objective:  There were no vitals filed for this visit.  Fetal Status:     Movement: Present     General:  Alert, oriented and cooperative. Patient is in no acute distress.  Respiratory: Normal respiratory effort, no problems with respiration noted  Mental Status: Normal mood and affect. Normal behavior. Normal judgment and thought content.  Rest of physical exam deferred due to type of encounter  Imaging: No results found.  Assessment and Plan:  Pregnancy:  G4P1021 at [redacted]w[redacted]d 1. Encounter for supervision of normal pregnancy, antepartum, unspecified gravidity   Preterm labor symptoms and general obstetric precautions including but not limited to vaginal bleeding, contractions, leaking of fluid and fetal movement were reviewed in detail with the patient. I discussed the assessment and treatment plan with the patient. The patient was provided an opportunity to ask questions and all were answered. The patient agreed with the plan and demonstrated an understanding of the instructions. The patient was advised to call back or seek an in-person office evaluation/go to MAU at [facility] for any urgent or concerning symptoms. Please refer to After Visit Summary for other counseling recommendations.   I provided 10 minutes of face-to-face time during this encounter.  Return in about 8 days (around 10/26/2018) for ROB in person.  GBS.  No future appointments.  Baltazar Najjar, MD [facility] [place], [place]

## 2018-10-18 NOTE — Progress Notes (Signed)
I connected with [name] on 10/18/18 at 10:00 AM EDT by telephone and verified that I am speaking with the correct person using two identifiers.   Webex ROB: No complaints today per pt

## 2018-10-24 ENCOUNTER — Telehealth: Payer: Self-pay

## 2018-10-24 ENCOUNTER — Ambulatory Visit: Payer: Medicaid Other

## 2018-10-24 NOTE — Telephone Encounter (Signed)
Returned call, pt states that she is having some bloody urine. Pt denies vaginal bleeding, denies pain or increased cramping/contractions. Pt reports fetal movement. Advised pt to come today for nurse visit, pt advised that she has her son so she cannot. Pt has appt on Wednesday, states that she will wait until then. Advised if symptoms change, get worse, call or be seen at the hospital, pt agreed.

## 2018-10-26 ENCOUNTER — Other Ambulatory Visit (HOSPITAL_COMMUNITY)
Admission: RE | Admit: 2018-10-26 | Discharge: 2018-10-26 | Disposition: A | Discharge status: Home / Self Care | Payer: Medicaid Other | Source: Ambulatory Visit | Attending: Certified Nurse Midwife | Admitting: Certified Nurse Midwife

## 2018-10-26 ENCOUNTER — Other Ambulatory Visit: Payer: Self-pay

## 2018-10-26 ENCOUNTER — Encounter: Payer: Medicaid Other | Admitting: Certified Nurse Midwife

## 2018-10-26 ENCOUNTER — Encounter: Payer: Self-pay | Admitting: Certified Nurse Midwife

## 2018-10-26 ENCOUNTER — Ambulatory Visit (INDEPENDENT_AMBULATORY_CARE_PROVIDER_SITE_OTHER): Payer: Medicaid Other | Admitting: Certified Nurse Midwife

## 2018-10-26 VITALS — BP 123/78 | HR 97 | Wt 157.8 lb

## 2018-10-26 DIAGNOSIS — O320XX0 Maternal care for unstable lie, not applicable or unspecified: Secondary | ICD-10-CM

## 2018-10-26 DIAGNOSIS — Z3490 Encounter for supervision of normal pregnancy, unspecified, unspecified trimester: Secondary | ICD-10-CM | POA: Insufficient documentation

## 2018-10-26 DIAGNOSIS — Z3A36 36 weeks gestation of pregnancy: Secondary | ICD-10-CM

## 2018-10-26 NOTE — Progress Notes (Signed)
   PRENATAL VISIT NOTE  Subjective:  Erin French is a 23 y.o. G4P1021 at [redacted]w[redacted]d being seen today for ongoing prenatal care.  She is currently monitored for the following issues for this low-risk pregnancy and has Encounter for supervision of normal pregnancy, unspecified, unspecified trimester and Atypical squamous cell changes of undetermined significance (ASCUS) on cervical cytology with positive high risk human papilloma virus (HPV) on their problem list.  Patient reports occasional contractions.  Contractions: Irritability. Vag. Bleeding: None.  Movement: Present. Denies leaking of fluid.   The following portions of the patient's history were reviewed and updated as appropriate: allergies, current medications, past family history, past medical history, past social history, past surgical history and problem list.   Objective:   Vitals:   10/26/18 1516  BP: 123/78  Pulse: 97  Weight: 157 lb 12.8 oz (71.6 kg)    Fetal Status: Fetal Heart Rate (bpm): 158 Fundal Height: 31 cm Movement: Present  Presentation: Complete Breech  General:  Alert, oriented and cooperative. Patient is in no acute distress.  Skin: Skin is warm and dry. No rash noted.   Cardiovascular: Normal heart rate noted  Respiratory: Normal respiratory effort, no problems with respiration noted  Abdomen: Soft, gravid, appropriate for gestational age.  Pain/Pressure: Absent     Pelvic: Cervical exam performed Dilation: Closed Effacement (%): 50 Station: -3  Extremities: Normal range of motion.  Edema: Trace  Mental Status: Normal mood and affect. Normal behavior. Normal judgment and thought content.   Assessment and Plan:  Pregnancy: G4P1021 at [redacted]w[redacted]d 1. Encounter for supervision of normal pregnancy, antepartum, unspecified gravidity - patient doing well, no complaints - Cervical examination noted possible fetal malpresentation, bedside US performed and noted breech position  - Consult with Dr Rip Harbour about position,  recommends formal US to assess position, AFI, and placenta location to know if version is possible  - Educated and discussed plan with patient. [place] scheduled for tomorrow and will call patient to discuss possibility of version once [place] is resulted  - Strep Gp B NAA - Cervicovaginal ancillary only( [place])  2. Unstable fetal lie, single or unspecified fetus - [place] scheduled for tomorrow to determine if version is possible  - [place] MFM OB LIMITED; Future  Preterm labor symptoms and general obstetric precautions including but not limited to vaginal bleeding, contractions, leaking of fluid and fetal movement were reviewed in detail with the patient. Please refer to After Visit Summary for other counseling recommendations.    Future Appointments  Date Time Provider [place]  10/27/2018 10:30 AM [place] 1 WH-MFCUS MFC-US  11/09/2018  3:00 PM Shelly Bombard, MD [place] None    Lajean Manes, CNM

## 2018-10-26 NOTE — Progress Notes (Signed)
Pt states she called triage this week regarding spotting pt notes bleeding was very light.   Pt request cervix check today.

## 2018-10-26 NOTE — Patient Instructions (Signed)
Reasons to go to MAU:  1.  Contractions are  5 minutes apart or less, each last 1 minute, these have been going on for 1-2 hours, and you cannot walk or talk during them 2.  You have a large gush of fluid, or a trickle of fluid that will not stop and you have to wear a pad 3.  You have bleeding that is bright red, heavier than spotting--like menstrual bleeding (spotting can be normal in early labor or after a check of your cervix) 4.  You do not feel the baby moving like he/she normally does  

## 2018-10-27 ENCOUNTER — Inpatient Hospital Stay (HOSPITAL_COMMUNITY)
Admission: AD | Admit: 2018-10-27 | Discharge: 2018-10-27 | Disposition: A | Discharge status: Home / Self Care | Payer: Medicaid Other | Attending: Family Medicine | Admitting: Family Medicine

## 2018-10-27 ENCOUNTER — Ambulatory Visit (HOSPITAL_COMMUNITY)
Admission: RE | Admit: 2018-10-27 | Discharge: 2018-10-27 | Disposition: A | Discharge status: Home / Self Care | Payer: Medicaid Other | Source: Ambulatory Visit | Attending: Obstetrics and Gynecology | Admitting: Obstetrics and Gynecology

## 2018-10-27 ENCOUNTER — Other Ambulatory Visit: Payer: Self-pay | Admitting: Certified Nurse Midwife

## 2018-10-27 ENCOUNTER — Encounter (HOSPITAL_COMMUNITY): Payer: Self-pay | Admitting: *Deleted

## 2018-10-27 ENCOUNTER — Telehealth (HOSPITAL_COMMUNITY): Payer: Self-pay | Admitting: *Deleted

## 2018-10-27 ENCOUNTER — Other Ambulatory Visit: Payer: Self-pay

## 2018-10-27 DIAGNOSIS — O321XX0 Maternal care for breech presentation, not applicable or unspecified: Secondary | ICD-10-CM | POA: Insufficient documentation

## 2018-10-27 DIAGNOSIS — Z0371 Encounter for suspected problem with amniotic cavity and membrane ruled out: Secondary | ICD-10-CM | POA: Diagnosis not present

## 2018-10-27 DIAGNOSIS — O4103X0 Oligohydramnios, third trimester, not applicable or unspecified: Secondary | ICD-10-CM

## 2018-10-27 DIAGNOSIS — O320XX0 Maternal care for unstable lie, not applicable or unspecified: Secondary | ICD-10-CM | POA: Diagnosis not present

## 2018-10-27 DIAGNOSIS — O4100X0 Oligohydramnios, unspecified trimester, not applicable or unspecified: Secondary | ICD-10-CM | POA: Diagnosis not present

## 2018-10-27 DIAGNOSIS — Z3A36 36 weeks gestation of pregnancy: Secondary | ICD-10-CM | POA: Diagnosis not present

## 2018-10-27 DIAGNOSIS — Z3689 Encounter for other specified antenatal screening: Secondary | ICD-10-CM

## 2018-10-27 DIAGNOSIS — N898 Other specified noninflammatory disorders of vagina: Secondary | ICD-10-CM | POA: Diagnosis present

## 2018-10-27 LAB — URINALYSIS, ROUTINE W REFLEX MICROSCOPIC
Bilirubin Urine: NEGATIVE
Glucose, UA: NEGATIVE mg/dL
Hgb urine dipstick: NEGATIVE
Ketones, ur: NEGATIVE mg/dL
Nitrite: NEGATIVE
Protein, ur: NEGATIVE mg/dL
Specific Gravity, Urine: 1.003 — ABNORMAL LOW (ref 1.005–1.030)
pH: 7 (ref 5.0–8.0)

## 2018-10-27 LAB — WET PREP, GENITAL
Clue Cells Wet Prep HPF POC: NONE SEEN
Sperm: NONE SEEN
Trich, Wet Prep: NONE SEEN
Yeast Wet Prep HPF POC: NONE SEEN

## 2018-10-27 LAB — POCT FERN TEST: POCT Fern Test: NEGATIVE

## 2018-10-27 LAB — AMNISURE RUPTURE OF MEMBRANE (ROM) NOT AT ARMC: Amnisure ROM: NEGATIVE

## 2018-10-27 MED ORDER — TERCONAZOLE 0.8 % VA CREA: 1.0000 | TOPICAL_CREAM | Freq: Every day | VAGINAL | 0 refills | Status: DC

## 2018-10-27 NOTE — Telephone Encounter (Signed)
Preadmission screen  

## 2018-10-27 NOTE — Discharge Instructions (Signed)

## 2018-10-27 NOTE — MAU Note (Signed)
Presents with c/o LOF since Monday, clear fluid. Denies current VB, reports VB earlier this week.  Endorses +FM.

## 2018-10-27 NOTE — MAU Provider Note (Signed)
First Provider Initiated Contact with Patient 10/27/18 1410     S: Ms. Erin French is a 23 y.o. G4P1021 at [redacted]w[redacted]d  who presents to MAU today complaining of leaking of fluid since Monday 10/24/18. She denies vaginal bleeding. She denies contractions. She reports normal fetal movement.    O: BP 129/68   Pulse (!) 102   Temp 98.6 F (37 C) (Oral)   Resp 20   Ht 5\' 4"  (1.626 m)   Wt 72.2 kg   LMP 02/16/2018   SpO2 100%   BMI 27.31 kg/m    GENERAL: Well-developed, well-nourished female in no acute distress.  HEAD: Normocephalic, atraumatic.  CHEST: Normal effort of breathing, regular heart rate ABDOMEN: Soft, nontender, gravid PELVIC: Normal external female genitalia. Vagina is pink and rugated. Cervix with normal contour, no lesions. Thick cottage cheese discharge throughout vaginal vault.  No pooling.   Cervical exam: Closed/thick/posterior. Frank breech position confirmed in MFM earlier today   Fetal Monitoring: Baseline: 140 Variability: Mod Accelerations: 15 x 15 Decelerations: None Contractions: Rare, not felt by patient  Results for orders placed or performed during the hospital encounter of 10/27/18 (from the past 24 hour(s))  Amnisure rupture of membrane (rom)not at [facility]     Status: None   Collection Time: 10/27/18  2:23 PM  Result Value Ref Range   Amnisure ROM NEGATIVE   Wet prep, genital     Status: Abnormal   Collection Time: 10/27/18  2:23 PM   Specimen: Cervical/Vaginal swab  Result Value Ref Range   Yeast Wet Prep HPF POC NONE SEEN NONE SEEN   Trich, Wet Prep NONE SEEN NONE SEEN   Clue Cells Wet Prep HPF POC NONE SEEN NONE SEEN   WBC, Wet Prep HPF POC MANY (A) NONE SEEN   Sperm NONE SEEN   Fern Test     Status: None   Collection Time: 10/27/18  2:55 PM  Result Value Ref Range   POCT Fern Test Negative = intact amniotic membranes     A: SIUP at [redacted]w[redacted]d  Membranes intact (negative pooling, negative gush with Valsalva, negative fern, negative  Amnisure) BPP 8/8 in MFM this afternoon Rx: Terconazole cream based on physical exam  P: Discharge home in stable condition with labor precautions   F/U: Pt has an appt for ECV on Tuesday 11/01/18  Mallie Snooks, MSN, CNM Certified Nurse Midwife, [facility] for [employer], [employer] 10/27/18 3:33 PM

## 2018-10-28 ENCOUNTER — Other Ambulatory Visit (HOSPITAL_COMMUNITY): Payer: Self-pay | Admitting: *Deleted

## 2018-10-28 ENCOUNTER — Telehealth (HOSPITAL_COMMUNITY): Payer: Self-pay | Admitting: *Deleted

## 2018-10-28 DIAGNOSIS — O4100X0 Oligohydramnios, unspecified trimester, not applicable or unspecified: Secondary | ICD-10-CM

## 2018-10-28 LAB — CERVICOVAGINAL ANCILLARY ONLY
Bacterial vaginitis: NEGATIVE
Candida vaginitis: POSITIVE — AB
Chlamydia: NEGATIVE
Neisseria Gonorrhea: NEGATIVE
Trichomonas: NEGATIVE

## 2018-10-28 LAB — STREP GP B NAA: Strep Gp B NAA: POSITIVE — AB

## 2018-10-28 NOTE — Telephone Encounter (Signed)
Preadmission screen  

## 2018-10-30 ENCOUNTER — Other Ambulatory Visit: Payer: Self-pay

## 2018-10-30 ENCOUNTER — Other Ambulatory Visit (HOSPITAL_COMMUNITY)
Admission: RE | Admit: 2018-10-30 | Discharge: 2018-10-30 | Disposition: A | Discharge status: Home / Self Care | Payer: Medicaid Other | Source: Ambulatory Visit | Attending: Obstetrics & Gynecology | Admitting: Obstetrics & Gynecology

## 2018-10-30 DIAGNOSIS — Z20828 Contact with and (suspected) exposure to other viral communicable diseases: Secondary | ICD-10-CM | POA: Insufficient documentation

## 2018-10-30 DIAGNOSIS — Z01812 Encounter for preprocedural laboratory examination: Secondary | ICD-10-CM | POA: Diagnosis not present

## 2018-10-30 LAB — SARS CORONAVIRUS 2 (TAT 6-24 HRS): SARS Coronavirus 2: NEGATIVE

## 2018-10-30 LAB — CBC WITH DIFFERENTIAL/PLATELET
Abs Immature Granulocytes: 0.01 10*3/uL (ref 0.00–0.07)
Basophils Absolute: 0 10*3/uL (ref 0.0–0.1)
Basophils Relative: 0 %
Eosinophils Absolute: 0 10*3/uL (ref 0.0–0.5)
Eosinophils Relative: 0 %
HCT: 30.5 % — ABNORMAL LOW (ref 36.0–46.0)
Hemoglobin: 9.8 g/dL — ABNORMAL LOW (ref 12.0–15.0)
Immature Granulocytes: 0 %
Lymphocytes Relative: 23 %
Lymphs Abs: 1.1 10*3/uL (ref 0.7–4.0)
MCH: 27.6 pg (ref 26.0–34.0)
MCHC: 32.1 g/dL (ref 30.0–36.0)
MCV: 85.9 fL (ref 80.0–100.0)
Monocytes Absolute: 0.5 10*3/uL (ref 0.1–1.0)
Monocytes Relative: 10 %
Neutro Abs: 3.2 10*3/uL (ref 1.7–7.7)
Neutrophils Relative %: 67 %
Platelets: 413 10*3/uL — ABNORMAL HIGH (ref 150–400)
RBC: 3.55 MIL/uL — ABNORMAL LOW (ref 3.87–5.11)
RDW: 12.7 % (ref 11.5–15.5)
WBC: 4.8 10*3/uL (ref 4.0–10.5)
nRBC: 0 % (ref 0.0–0.2)

## 2018-10-30 LAB — RPR: RPR Ser Ql: NONREACTIVE

## 2018-10-30 NOTE — MAU Note (Signed)
Pt here for PAT covid swab and lab draw. Pt denies symptoms. Swab collected. Informed pt to leave on blood bank bracelet, pt verbalizes understanding.

## 2018-10-31 ENCOUNTER — Other Ambulatory Visit: Payer: Self-pay | Admitting: Advanced Practice Midwife

## 2018-10-31 MED ORDER — TERBUTALINE SULFATE 1 MG/ML IJ SOLN: 0.2500 mg | Freq: Once | INTRAMUSCULAR | Status: DC

## 2018-10-31 MED ORDER — LACTATED RINGERS IV SOLN: INTRAVENOUS | Status: DC

## 2018-11-01 ENCOUNTER — Encounter (HOSPITAL_COMMUNITY): Payer: Self-pay | Admitting: *Deleted

## 2018-11-01 ENCOUNTER — Other Ambulatory Visit: Payer: Self-pay

## 2018-11-01 ENCOUNTER — Ambulatory Visit (HOSPITAL_COMMUNITY)
Admission: RE | Admit: 2018-11-01 | Discharge: 2018-11-01 | Disposition: A | Discharge status: Home / Self Care | Payer: Medicaid Other | Attending: Family Medicine | Admitting: Family Medicine

## 2018-11-01 ENCOUNTER — Inpatient Hospital Stay (HOSPITAL_COMMUNITY): Admission: AD | Admit: 2018-11-01 | Payer: Medicaid Other | Source: Home / Self Care | Admitting: Family Medicine

## 2018-11-01 ENCOUNTER — Ambulatory Visit (HOSPITAL_COMMUNITY): Payer: Medicaid Other | Admitting: *Deleted

## 2018-11-01 ENCOUNTER — Ambulatory Visit (HOSPITAL_COMMUNITY)
Admit: 2018-11-01 | Discharge: 2018-11-01 | Disposition: A | Discharge status: Home / Self Care | Payer: Medicaid Other | Attending: Family Medicine | Admitting: Family Medicine

## 2018-11-01 ENCOUNTER — Ambulatory Visit (HOSPITAL_BASED_OUTPATIENT_CLINIC_OR_DEPARTMENT_OTHER)
Admission: RE | Admit: 2018-11-01 | Discharge: 2018-11-01 | Disposition: A | Discharge status: Home / Self Care | Payer: Medicaid Other | Source: Ambulatory Visit | Attending: Obstetrics and Gynecology | Admitting: Obstetrics and Gynecology

## 2018-11-01 ENCOUNTER — Encounter (HOSPITAL_COMMUNITY): Payer: Self-pay

## 2018-11-01 VITALS — BP 127/76 | HR 99 | Temp 98.8°F

## 2018-11-01 DIAGNOSIS — D563 Thalassemia minor: Secondary | ICD-10-CM | POA: Insufficient documentation

## 2018-11-01 DIAGNOSIS — O4100X0 Oligohydramnios, unspecified trimester, not applicable or unspecified: Secondary | ICD-10-CM | POA: Insufficient documentation

## 2018-11-01 DIAGNOSIS — O329XX0 Maternal care for malpresentation of fetus, unspecified, not applicable or unspecified: Secondary | ICD-10-CM

## 2018-11-01 DIAGNOSIS — Z3A36 36 weeks gestation of pregnancy: Secondary | ICD-10-CM | POA: Insufficient documentation

## 2018-11-01 DIAGNOSIS — O321XX0 Maternal care for breech presentation, not applicable or unspecified: Secondary | ICD-10-CM | POA: Diagnosis not present

## 2018-11-01 DIAGNOSIS — O4103X0 Oligohydramnios, third trimester, not applicable or unspecified: Secondary | ICD-10-CM | POA: Diagnosis not present

## 2018-11-01 DIAGNOSIS — Z362 Encounter for other antenatal screening follow-up: Secondary | ICD-10-CM

## 2018-11-01 DIAGNOSIS — Z98891 History of uterine scar from previous surgery: Secondary | ICD-10-CM

## 2018-11-01 LAB — TYPE AND SCREEN
ABO/RH(D): O POS
Antibody Screen: NEGATIVE

## 2018-11-01 MED ORDER — TERBUTALINE SULFATE 1 MG/ML IJ SOLN
INTRAMUSCULAR | Status: AC
  Filled 2018-11-01: qty 1

## 2018-11-01 MED ORDER — LACTATED RINGERS IV SOLN
INTRAVENOUS | Status: DC
  Administered 2018-11-01: 08:00:00 via INTRAVENOUS

## 2018-11-01 MED ORDER — TERBUTALINE SULFATE 1 MG/ML IJ SOLN
0.2500 mg | Freq: Once | INTRAMUSCULAR | Status: AC
  Administered 2018-11-01: 0.25 mg via SUBCUTANEOUS

## 2018-11-01 NOTE — Procedures (Signed)
After informed verbal consent, Terbutaline 0.25 mg SQ given, ECV was attempted under Ultrasound guidance.  4 attempts made, both counterclockwise and clockwise, but unsuccessful.   FHR was reactive before and after the procedure.   Pt. Tolerated the procedure well.  Truett Mainland, DO 11/01/2018, 9:02 AM

## 2018-11-01 NOTE — H&P (Signed)
Faculty Practice H&P  Erin French is a 23 y.o. female [phone] with IUP at [redacted]w[redacted]d presenting for ECV for [place] position. Pregnancy was been complicated by h/o HPV, ASCUS PAP.    Pt states she has been having no contractions, no vaginal bleeding, intact membranes, with normal fetal movement.     Prenatal Course Source of Care: [facility] with onset of care at 16 weeks  Pregnancy complications or risks: Patient Active Problem List   Diagnosis Date Noted  . Atypical squamous cell changes of undetermined significance (ASCUS) on cervical cytology with positive high risk human papilloma virus (HPV) 06/25/2018  . Encounter for supervision of normal pregnancy, unspecified, unspecified trimester 06/07/2018    Prenatal labs and studies: ABO, Rh: --/--/O POS (09/06 1004) Antibody: NEG (09/06 1004) Rubella: 16.50 (04/14 1611) RPR: NON REACTIVE (09/06 1004)  HBsAg: Negative (04/14 1611)  HIV: Non Reactive (07/14 0855)  GBS: Positive (09/02 0355)  2hr Glucola: negative Genetic screening: normal Anatomy US: normal  Past Medical History:  Past Medical History:  Diagnosis Date  . Chlamydia 4/12  . Medical history non-contributory     Past Surgical History:  Past Surgical History:  Procedure Laterality Date  . INDUCED ABORTION    . NO PAST SURGERIES      Obstetrical History:  OB History    Gravida  4   Para  1   Term  1   Preterm      AB  2   Living  1     SAB      TAB      Ectopic      Multiple  0   Live Births  1           Gynecological History:  OB History    Gravida  4   Para  1   Term  1   Preterm      AB  2   Living  1     SAB      TAB      Ectopic      Multiple  0   Live Births  1           Social History:  Social History   Socioeconomic History  . Marital status: Single    Spouse name: Not on file  . Number of children: Not on file  . Years of education: Not on file  . Highest education level: Not on file   Occupational History  . Not on file  Social Needs  . Financial resource strain: Not on file  . Food insecurity    Worry: Not on file    Inability: Not on file  . Transportation needs    Medical: Not on file    Non-medical: Not on file  Tobacco Use  . Smoking status: Never Smoker  . Smokeless tobacco: Never Used  Substance and Sexual Activity  . Alcohol use: Not Currently    Alcohol/week: 0.0 standard drinks    Comment: Last drink December 2019  . Drug use: No  . Sexual activity: Yes    Partners: Male    Birth control/protection: None    Comment: INTERCOURSE AGE 54, SEXUAL PARTNERS LESS THAN 5  Lifestyle  . Physical activity    Days per week: Not on file    Minutes per session: Not on file  . Stress: Not on file  Relationships  . Social connections    Talks on phone: Not on file  Gets together: Not on file    Attends religious service: Not on file    Active member of club or organization: Not on file    Attends meetings of clubs or organizations: Not on file    Relationship status: Not on file  Other Topics Concern  . Not on file  Social History Narrative  . Not on file    Family History:  Family History  Problem Relation Age of Onset  . Hypertension Father   . Schizophrenia Mother   . Hypertension Maternal Grandmother     Medications:  Prenatal vitamins,  Current Facility-Administered Medications  Medication Dose Route Frequency Provider Last Rate Last Dose  . lactated ringers infusion   Intravenous Continuous Myrtis Ser, CNM 125 mL/hr at 11/01/18 0805     Facility-Administered Medications Ordered in Other Encounters  Medication Dose Route Frequency Provider Last Rate Last Dose  . lactated ringers infusion   Intravenous Continuous Myrtis Ser, CNM      . terbutaline (BRETHINE) injection 0.25 mg  0.25 mg Subcutaneous Once Myrtis Ser, CNM        Allergies: No Known Allergies  Review of Systems: - negative  Physical Exam: Blood  pressure 127/83, pulse 92, temperature 98.3 F (36.8 C), temperature source Oral, resp. rate 18, height 5\' 4"  (1.626 m), weight 71.2 kg, last menstrual period 02/16/2018, SpO2 99 %, currently breastfeeding. GENERAL: Well-developed, well-nourished female in no acute distress.  LUNGS: Clear to auscultation bilaterally.  HEART: Regular rate and rhythm. ABDOMEN: Soft, nontender, nondistended, gravid.  EXTREMITIES: Nontender, no edema, 2+ distal pulses.    Pertinent Labs/Studies:   Lab Results  Component Value Date   WBC 4.8 10/30/2018   HGB 9.8 (L) 10/30/2018   HCT 30.5 (L) 10/30/2018   MCV 85.9 10/30/2018   PLT 413 (H) 10/30/2018    Assessment : Erin French is a 23 y.o. G4P1021 at [redacted]w[redacted]d here for ECV.  Plan: Discussed risks of ECV including Fetal Intolerance, fetal bradycardia, placental abruption, ROM.  The risks of cesarean section discussed with the patient included but were not limited to: bleeding which may require transfusion or reoperation; infection which may require antibiotics; injury to bowel, bladder, ureters or other surrounding organs; injury to the fetus; need for additional procedures including hysterectomy in the event of a life-threatening hemorrhage; placental abnormalities wth subsequent pregnancies, incisional problems, thromboembolic phenomenon and other postoperative/anesthesia complications. The patient concurred with the proposed plan, giving informed written consent for the procedure.   Patient has been NPO since last night and will remain NPO for procedure.    Truett Mainland, DO 11/01/2018, 8:34 AM

## 2018-11-01 NOTE — Discharge Summary (Signed)
Patient here for outpatient procedure. Patient was not admitted.

## 2018-11-02 NOTE — Patient Instructions (Signed)
Erin French  11/02/2018   Your procedure is scheduled on:  11/17/2018  Arrive at 79 at Entrance C on [address] at [facility]  and [address]. You are invited to use the FREE valet parking or use the Visitor's parking deck.  Pick up the phone at the desk and dial [phone].  Call this number if you have problems the morning of surgery: [phone]  Remember:   Do not eat food:(After Midnight) Desps de medianoche.  Do not drink clear liquids: (After Midnight) Desps de medianoche.  Take these medicines the morning of surgery with A SIP OF WATER:  none   Do not wear jewelry, make-up or nail polish.  Do not wear lotions, powders, or perfumes. Do not wear deodorant.  Do not shave 48 hours prior to surgery.  Do not bring valuables to the hospital.  [facility] is not   responsible for any belongings or valuables brought to the hospital.  Contacts, dentures or bridgework may not be worn into surgery.  Leave suitcase in the car. After surgery it may be brought to your room.  For patients admitted to the hospital, checkout time is 11:00 AM the day of              discharge.      Please read over the following fact sheets that you were given:     Preparing for Surgery

## 2018-11-09 ENCOUNTER — Telehealth: Payer: Medicaid Other | Admitting: Obstetrics

## 2018-11-09 ENCOUNTER — Encounter: Payer: Self-pay | Admitting: Obstetrics

## 2018-11-09 NOTE — Progress Notes (Signed)
S/w patient for mychart visit, pt reports fetal movement, denies pain. Pt states she does not have BP cuff with her.   Pt never connected to mychart for visit, called back, no answer.

## 2018-11-09 NOTE — Progress Notes (Signed)
Patient did not connect to My[facility], and was not accepting phone calls.  Shelly Bombard, MD 11/09/2018 4:28 PM

## 2018-11-15 ENCOUNTER — Other Ambulatory Visit (HOSPITAL_COMMUNITY)
Admission: RE | Admit: 2018-11-15 | Discharge: 2018-11-15 | Disposition: A | Discharge status: Home / Self Care | Payer: Medicaid Other | Source: Ambulatory Visit | Attending: Obstetrics and Gynecology | Admitting: Obstetrics and Gynecology

## 2018-11-15 ENCOUNTER — Other Ambulatory Visit: Payer: Self-pay

## 2018-11-15 DIAGNOSIS — Z20828 Contact with and (suspected) exposure to other viral communicable diseases: Secondary | ICD-10-CM | POA: Diagnosis not present

## 2018-11-15 DIAGNOSIS — O321XX0 Maternal care for breech presentation, not applicable or unspecified: Secondary | ICD-10-CM | POA: Diagnosis not present

## 2018-11-15 DIAGNOSIS — Z01812 Encounter for preprocedural laboratory examination: Secondary | ICD-10-CM | POA: Diagnosis not present

## 2018-11-15 LAB — CBC
HCT: 30.6 % — ABNORMAL LOW (ref 36.0–46.0)
Hemoglobin: 9.8 g/dL — ABNORMAL LOW (ref 12.0–15.0)
MCH: 27.5 pg (ref 26.0–34.0)
MCHC: 32 g/dL (ref 30.0–36.0)
MCV: 85.7 fL (ref 80.0–100.0)
Platelets: 385 10*3/uL (ref 150–400)
RBC: 3.57 MIL/uL — ABNORMAL LOW (ref 3.87–5.11)
RDW: 13 % (ref 11.5–15.5)
WBC: 5.1 10*3/uL (ref 4.0–10.5)
nRBC: 0 % (ref 0.0–0.2)

## 2018-11-15 LAB — RPR: RPR Ser Ql: NONREACTIVE

## 2018-11-15 LAB — TYPE AND SCREEN
ABO/RH(D): O POS
Antibody Screen: NEGATIVE

## 2018-11-15 LAB — RAPID HIV SCREEN (HIV 1/2 AB+AG)
HIV 1/2 Antibodies: NONREACTIVE
HIV-1 P24 Antigen - HIV24: NONREACTIVE

## 2018-11-15 LAB — SARS CORONAVIRUS 2 (TAT 6-24 HRS): SARS Coronavirus 2: NEGATIVE

## 2018-11-15 NOTE — MAU Note (Signed)
Asymptomatic, swab collected. 

## 2018-11-17 ENCOUNTER — Inpatient Hospital Stay (HOSPITAL_COMMUNITY)
Admission: RE | Admit: 2018-11-17 | Discharge: 2018-11-19 | DRG: 788 | Disposition: A | Discharge status: Home / Self Care | Payer: Medicaid Other | Attending: Family Medicine | Admitting: Family Medicine

## 2018-11-17 ENCOUNTER — Other Ambulatory Visit: Payer: Self-pay

## 2018-11-17 ENCOUNTER — Encounter (HOSPITAL_COMMUNITY)
Admission: RE | Disposition: A | Discharge status: Home / Self Care | Payer: Self-pay | Source: Home / Self Care | Attending: Family Medicine

## 2018-11-17 ENCOUNTER — Inpatient Hospital Stay (HOSPITAL_COMMUNITY): Payer: Medicaid Other | Admitting: Anesthesiology

## 2018-11-17 ENCOUNTER — Encounter (HOSPITAL_COMMUNITY): Payer: Self-pay | Admitting: *Deleted

## 2018-11-17 DIAGNOSIS — O3413 Maternal care for benign tumor of corpus uteri, third trimester: Secondary | ICD-10-CM | POA: Diagnosis not present

## 2018-11-17 DIAGNOSIS — Z20828 Contact with and (suspected) exposure to other viral communicable diseases: Secondary | ICD-10-CM | POA: Diagnosis present

## 2018-11-17 DIAGNOSIS — D259 Leiomyoma of uterus, unspecified: Secondary | ICD-10-CM

## 2018-11-17 DIAGNOSIS — Z3A39 39 weeks gestation of pregnancy: Secondary | ICD-10-CM

## 2018-11-17 DIAGNOSIS — O329XX0 Maternal care for malpresentation of fetus, unspecified, not applicable or unspecified: Secondary | ICD-10-CM

## 2018-11-17 DIAGNOSIS — D252 Subserosal leiomyoma of uterus: Secondary | ICD-10-CM | POA: Diagnosis present

## 2018-11-17 DIAGNOSIS — Z30017 Encounter for initial prescription of implantable subdermal contraceptive: Secondary | ICD-10-CM | POA: Diagnosis not present

## 2018-11-17 DIAGNOSIS — O321XX0 Maternal care for breech presentation, not applicable or unspecified: Principal | ICD-10-CM | POA: Diagnosis present

## 2018-11-17 DIAGNOSIS — L299 Pruritus, unspecified: Secondary | ICD-10-CM | POA: Diagnosis present

## 2018-11-17 DIAGNOSIS — O99824 Streptococcus B carrier state complicating childbirth: Secondary | ICD-10-CM | POA: Diagnosis present

## 2018-11-17 DIAGNOSIS — D649 Anemia, unspecified: Secondary | ICD-10-CM

## 2018-11-17 DIAGNOSIS — O26893 Other specified pregnancy related conditions, third trimester: Secondary | ICD-10-CM | POA: Diagnosis not present

## 2018-11-17 DIAGNOSIS — Z98891 History of uterine scar from previous surgery: Secondary | ICD-10-CM

## 2018-11-17 SURGERY — Surgical Case
Anesthesia: Spinal

## 2018-11-17 MED ORDER — CEFAZOLIN SODIUM-DEXTROSE 2-4 GM/100ML-% IV SOLN: 2.0000 g | INTRAVENOUS | Status: DC

## 2018-11-17 MED ORDER — COCONUT OIL OIL: 1.0000 "application " | TOPICAL_OIL | Status: DC | PRN

## 2018-11-17 MED ORDER — SENNOSIDES-DOCUSATE SODIUM 8.6-50 MG PO TABS
2.0000 | ORAL_TABLET | ORAL | Status: DC
  Administered 2018-11-17 – 2018-11-19 (×2): 2 via ORAL
  Filled 2018-11-17 (×2): qty 2

## 2018-11-17 MED ORDER — NALBUPHINE SYRINGE 5 MG/0.5 ML
5.0000 mg | INJECTION | INTRAMUSCULAR | Status: DC | PRN
  Filled 2018-11-17: qty 0.5

## 2018-11-17 MED ORDER — DIBUCAINE (PERIANAL) 1 % EX OINT: 1.0000 "application " | TOPICAL_OINTMENT | CUTANEOUS | Status: DC | PRN

## 2018-11-17 MED ORDER — MENTHOL 3 MG MT LOZG: 1.0000 | LOZENGE | OROMUCOSAL | Status: DC | PRN

## 2018-11-17 MED ORDER — DIPHENHYDRAMINE HCL 25 MG PO CAPS
25.0000 mg | ORAL_CAPSULE | Freq: Four times a day (QID) | ORAL | Status: DC | PRN
  Administered 2018-11-17: 25 mg via ORAL

## 2018-11-17 MED ORDER — OXYTOCIN 10 UNIT/ML IJ SOLN
INTRAMUSCULAR | Status: DC | PRN
  Administered 2018-11-17: 40 [IU]

## 2018-11-17 MED ORDER — NALBUPHINE SYRINGE 5 MG/0.5 ML
5.0000 mg | INJECTION | Freq: Once | INTRAMUSCULAR | Status: AC | PRN
  Filled 2018-11-17: qty 0.5

## 2018-11-17 MED ORDER — DEXAMETHASONE SODIUM PHOSPHATE 4 MG/ML IJ SOLN
INTRAMUSCULAR | Status: AC
  Filled 2018-11-17: qty 6

## 2018-11-17 MED ORDER — OXYCODONE HCL 5 MG/5ML PO SOLN: 5.0000 mg | Freq: Once | ORAL | Status: DC | PRN

## 2018-11-17 MED ORDER — OXYCODONE HCL 5 MG PO TABS
5.0000 mg | ORAL_TABLET | ORAL | Status: DC | PRN
  Administered 2018-11-18: 10 mg via ORAL
  Administered 2018-11-18: 5 mg via ORAL
  Administered 2018-11-18 – 2018-11-19 (×2): 10 mg via ORAL
  Administered 2018-11-19: 5 mg via ORAL
  Administered 2018-11-19: 10 mg via ORAL
  Filled 2018-11-17 (×4): qty 2
  Filled 2018-11-17 (×2): qty 1

## 2018-11-17 MED ORDER — KETOROLAC TROMETHAMINE 30 MG/ML IJ SOLN
30.0000 mg | Freq: Once | INTRAMUSCULAR | Status: AC | PRN
  Administered 2018-11-17: 30 mg via INTRAVENOUS

## 2018-11-17 MED ORDER — GABAPENTIN 300 MG PO CAPS
300.0000 mg | ORAL_CAPSULE | Freq: Three times a day (TID) | ORAL | Status: DC | PRN
  Administered 2018-11-18 (×2): 300 mg via ORAL
  Filled 2018-11-17 (×2): qty 1

## 2018-11-17 MED ORDER — LACTATED RINGERS IV SOLN
INTRAVENOUS | Status: DC
  Administered 2018-11-18: 04:00:00 via INTRAVENOUS

## 2018-11-17 MED ORDER — LACTATED RINGERS IV SOLN
INTRAVENOUS | Status: DC
  Administered 2018-11-17 (×3): via INTRAVENOUS

## 2018-11-17 MED ORDER — ONDANSETRON HCL 4 MG/2ML IJ SOLN
INTRAMUSCULAR | Status: AC
  Filled 2018-11-17: qty 2

## 2018-11-17 MED ORDER — DEXAMETHASONE SODIUM PHOSPHATE 4 MG/ML IJ SOLN
INTRAMUSCULAR | Status: DC | PRN
  Administered 2018-11-17: 4 mg via INTRAVENOUS

## 2018-11-17 MED ORDER — SCOPOLAMINE 1 MG/3DAYS TD PT72
MEDICATED_PATCH | TRANSDERMAL | Status: AC
  Filled 2018-11-17: qty 1

## 2018-11-17 MED ORDER — ONDANSETRON HCL 4 MG/2ML IJ SOLN: 4.0000 mg | Freq: Three times a day (TID) | INTRAMUSCULAR | Status: DC | PRN

## 2018-11-17 MED ORDER — CEFAZOLIN SODIUM-DEXTROSE 2-4 GM/100ML-% IV SOLN
INTRAVENOUS | Status: AC
  Filled 2018-11-17: qty 100

## 2018-11-17 MED ORDER — SODIUM CHLORIDE 0.9% FLUSH: 3.0000 mL | INTRAVENOUS | Status: DC | PRN

## 2018-11-17 MED ORDER — KETOROLAC TROMETHAMINE 30 MG/ML IJ SOLN
INTRAMUSCULAR | Status: AC
  Filled 2018-11-17: qty 1

## 2018-11-17 MED ORDER — NALOXONE HCL 0.4 MG/ML IJ SOLN: 0.4000 mg | INTRAMUSCULAR | Status: DC | PRN

## 2018-11-17 MED ORDER — FENTANYL CITRATE (PF) 100 MCG/2ML IJ SOLN
INTRAMUSCULAR | Status: DC | PRN
  Administered 2018-11-17: 15 ug via INTRATHECAL

## 2018-11-17 MED ORDER — TETANUS-DIPHTH-ACELL PERTUSSIS 5-2.5-18.5 LF-MCG/0.5 IM SUSP: 0.5000 mL | Freq: Once | INTRAMUSCULAR | Status: DC

## 2018-11-17 MED ORDER — OXYTOCIN 40 UNITS IN NORMAL SALINE INFUSION - SIMPLE MED
2.5000 [IU]/h | INTRAVENOUS | Status: AC
  Administered 2018-11-17: 2.5 [IU]/h via INTRAVENOUS

## 2018-11-17 MED ORDER — DIPHENHYDRAMINE HCL 50 MG/ML IJ SOLN
12.5000 mg | INTRAMUSCULAR | Status: DC | PRN
  Administered 2018-11-18: 12.5 mg via INTRAVENOUS
  Filled 2018-11-17: qty 1

## 2018-11-17 MED ORDER — MORPHINE SULFATE (PF) 0.5 MG/ML IJ SOLN
INTRAMUSCULAR | Status: DC | PRN
  Administered 2018-11-17: 150 ug via INTRATHECAL

## 2018-11-17 MED ORDER — SIMETHICONE 80 MG PO CHEW
80.0000 mg | CHEWABLE_TABLET | ORAL | Status: DC
  Administered 2018-11-17 – 2018-11-19 (×2): 80 mg via ORAL
  Filled 2018-11-17 (×3): qty 1

## 2018-11-17 MED ORDER — PRENATAL MULTIVITAMIN CH
1.0000 | ORAL_TABLET | Freq: Every day | ORAL | Status: DC
  Administered 2018-11-18 – 2018-11-19 (×2): 1 via ORAL
  Filled 2018-11-17 (×2): qty 1

## 2018-11-17 MED ORDER — PHENYLEPHRINE HCL-NACL 20-0.9 MG/250ML-% IV SOLN
INTRAVENOUS | Status: AC
  Filled 2018-11-17: qty 250

## 2018-11-17 MED ORDER — SCOPOLAMINE 1 MG/3DAYS TD PT72: 1.0000 | MEDICATED_PATCH | Freq: Once | TRANSDERMAL | Status: DC

## 2018-11-17 MED ORDER — NALBUPHINE SYRINGE 5 MG/0.5 ML
5.0000 mg | INJECTION | Freq: Once | INTRAMUSCULAR | Status: AC | PRN
  Administered 2018-11-18: 5 mg via INTRAVENOUS
  Filled 2018-11-17 (×4): qty 0.5

## 2018-11-17 MED ORDER — SODIUM CHLORIDE 0.9 % IV SOLN
INTRAVENOUS | Status: DC | PRN
  Administered 2018-11-17: 18:00:00 via INTRAVENOUS

## 2018-11-17 MED ORDER — CEFAZOLIN SODIUM-DEXTROSE 2-3 GM-%(50ML) IV SOLR
INTRAVENOUS | Status: DC | PRN
  Administered 2018-11-17: 2 g via INTRAVENOUS

## 2018-11-17 MED ORDER — METOCLOPRAMIDE HCL 5 MG/ML IJ SOLN
INTRAMUSCULAR | Status: DC | PRN
  Administered 2018-11-17: 10 mg via INTRAVENOUS

## 2018-11-17 MED ORDER — ENOXAPARIN SODIUM 40 MG/0.4ML ~~LOC~~ SOLN
40.0000 mg | SUBCUTANEOUS | Status: DC
  Administered 2018-11-18 – 2018-11-19 (×2): 40 mg via SUBCUTANEOUS
  Filled 2018-11-17 (×2): qty 0.4

## 2018-11-17 MED ORDER — ACETAMINOPHEN 325 MG PO TABS
650.0000 mg | ORAL_TABLET | ORAL | Status: DC | PRN
  Administered 2018-11-18 – 2018-11-19 (×7): 650 mg via ORAL
  Filled 2018-11-17 (×7): qty 2

## 2018-11-17 MED ORDER — FENTANYL CITRATE (PF) 100 MCG/2ML IJ SOLN
INTRAMUSCULAR | Status: AC
  Filled 2018-11-17: qty 2

## 2018-11-17 MED ORDER — MEPERIDINE HCL 25 MG/ML IJ SOLN: 6.2500 mg | INTRAMUSCULAR | Status: DC | PRN

## 2018-11-17 MED ORDER — NALOXONE HCL 4 MG/10ML IJ SOLN
1.0000 ug/kg/h | INTRAVENOUS | Status: DC | PRN
  Filled 2018-11-17: qty 5

## 2018-11-17 MED ORDER — BUPIVACAINE IN DEXTROSE 0.75-8.25 % IT SOLN
INTRATHECAL | Status: DC | PRN
  Administered 2018-11-17: 1.6 mL via INTRATHECAL

## 2018-11-17 MED ORDER — OXYTOCIN 40 UNITS IN NORMAL SALINE INFUSION - SIMPLE MED
INTRAVENOUS | Status: AC
  Filled 2018-11-17: qty 1000

## 2018-11-17 MED ORDER — DIPHENHYDRAMINE HCL 50 MG/ML IJ SOLN
INTRAMUSCULAR | Status: AC
  Filled 2018-11-17: qty 1

## 2018-11-17 MED ORDER — NALBUPHINE SYRINGE 5 MG/0.5 ML
5.0000 mg | INJECTION | INTRAMUSCULAR | Status: DC | PRN
  Administered 2018-11-18: 5 mg via SUBCUTANEOUS
  Filled 2018-11-17 (×4): qty 0.5

## 2018-11-17 MED ORDER — PHENYLEPHRINE 40 MCG/ML (10ML) SYRINGE FOR IV PUSH (FOR BLOOD PRESSURE SUPPORT)
PREFILLED_SYRINGE | INTRAVENOUS | Status: AC
  Filled 2018-11-17: qty 10

## 2018-11-17 MED ORDER — ONDANSETRON HCL 4 MG/2ML IJ SOLN: 4.0000 mg | Freq: Once | INTRAMUSCULAR | Status: DC | PRN

## 2018-11-17 MED ORDER — SODIUM CHLORIDE 0.9 % IV SOLN
INTRAVENOUS | Status: DC | PRN
  Administered 2018-11-17: 60 ug/min via INTRAVENOUS

## 2018-11-17 MED ORDER — FENTANYL CITRATE (PF) 100 MCG/2ML IJ SOLN: 25.0000 ug | INTRAMUSCULAR | Status: DC | PRN

## 2018-11-17 MED ORDER — OXYCODONE HCL 5 MG PO TABS: 5.0000 mg | ORAL_TABLET | Freq: Once | ORAL | Status: DC | PRN

## 2018-11-17 MED ORDER — SOD CITRATE-CITRIC ACID 500-334 MG/5ML PO SOLN
ORAL | Status: AC
  Filled 2018-11-17: qty 30

## 2018-11-17 MED ORDER — ZOLPIDEM TARTRATE 5 MG PO TABS: 5.0000 mg | ORAL_TABLET | Freq: Every evening | ORAL | Status: DC | PRN

## 2018-11-17 MED ORDER — SIMETHICONE 80 MG PO CHEW
80.0000 mg | CHEWABLE_TABLET | Freq: Three times a day (TID) | ORAL | Status: DC
  Administered 2018-11-18 – 2018-11-19 (×4): 80 mg via ORAL
  Filled 2018-11-17 (×4): qty 1

## 2018-11-17 MED ORDER — ONDANSETRON HCL 4 MG/2ML IJ SOLN
INTRAMUSCULAR | Status: DC | PRN
  Administered 2018-11-17: 4 mg via INTRAVENOUS

## 2018-11-17 MED ORDER — SIMETHICONE 80 MG PO CHEW: 80.0000 mg | CHEWABLE_TABLET | ORAL | Status: DC | PRN

## 2018-11-17 MED ORDER — METOCLOPRAMIDE HCL 5 MG/ML IJ SOLN
INTRAMUSCULAR | Status: AC
  Filled 2018-11-17: qty 2

## 2018-11-17 MED ORDER — DIPHENHYDRAMINE HCL 25 MG PO CAPS
ORAL_CAPSULE | ORAL | Status: AC
  Filled 2018-11-17: qty 1

## 2018-11-17 MED ORDER — SCOPOLAMINE 1 MG/3DAYS TD PT72
MEDICATED_PATCH | TRANSDERMAL | Status: DC | PRN
  Administered 2018-11-17: 1 via TRANSDERMAL

## 2018-11-17 MED ORDER — SOD CITRATE-CITRIC ACID 500-334 MG/5ML PO SOLN
30.0000 mL | ORAL | Status: AC
  Administered 2018-11-17: 30 mL via ORAL

## 2018-11-17 MED ORDER — MORPHINE SULFATE (PF) 0.5 MG/ML IJ SOLN
INTRAMUSCULAR | Status: AC
  Filled 2018-11-17: qty 10

## 2018-11-17 MED ORDER — DIPHENHYDRAMINE HCL 25 MG PO CAPS
25.0000 mg | ORAL_CAPSULE | ORAL | Status: DC | PRN
  Filled 2018-11-17: qty 1

## 2018-11-17 MED ORDER — WITCH HAZEL-GLYCERIN EX PADS: 1.0000 "application " | MEDICATED_PAD | CUTANEOUS | Status: DC | PRN

## 2018-11-17 SURGICAL SUPPLY — 30 items
BENZOIN TINCTURE PRP APPL 2/3 (GAUZE/BANDAGES/DRESSINGS) ×3 IMPLANT
CHLORAPREP W/TINT 26ML (MISCELLANEOUS) ×3 IMPLANT
CLOSURE WOUND 1/2 X4 (GAUZE/BANDAGES/DRESSINGS) ×1
CLOTH BEACON ORANGE TIMEOUT ST (SAFETY) ×3 IMPLANT
DRSG OPSITE POSTOP 4X10 (GAUZE/BANDAGES/DRESSINGS) ×3 IMPLANT
ELECT REM PT RETURN 9FT ADLT (ELECTROSURGICAL) ×3
ELECTRODE REM PT RTRN 9FT ADLT (ELECTROSURGICAL) ×1 IMPLANT
GLOVE BIOGEL PI IND STRL 7.0 (GLOVE) ×3 IMPLANT
GLOVE BIOGEL PI INDICATOR 7.0 (GLOVE) ×6
GLOVE ECLIPSE 6.5 STRL STRAW (GLOVE) ×3 IMPLANT
GOWN STRL REUS W/ TWL LRG LVL3 (GOWN DISPOSABLE) ×2 IMPLANT
GOWN STRL REUS W/TWL LRG LVL3 (GOWN DISPOSABLE) ×4
NEEDLE HYPO 22GX1.5 SAFETY (NEEDLE) ×3 IMPLANT
NS IRRIG 1000ML POUR BTL (IV SOLUTION) ×3 IMPLANT
PAD OB MATERNITY 4.3X12.25 (PERSONAL CARE ITEMS) ×3 IMPLANT
PAD PREP 24X48 CUFFED NSTRL (MISCELLANEOUS) ×3 IMPLANT
RETRACTOR WND ALEXIS 25 LRG (MISCELLANEOUS) IMPLANT
RTRCTR WOUND ALEXIS 25CM LRG (MISCELLANEOUS)
SPONGE LAP 18X18 RF (DISPOSABLE) ×9 IMPLANT
STRIP CLOSURE SKIN 1/2X4 (GAUZE/BANDAGES/DRESSINGS) ×2 IMPLANT
SUT MON AB 4-0 PS1 27 (SUTURE) ×3 IMPLANT
SUT PLAIN 2 0 XLH (SUTURE) ×3 IMPLANT
SUT VIC AB 0 CT1 36 (SUTURE) ×6 IMPLANT
SUT VIC AB 2-0 CT1 27 (SUTURE) ×2
SUT VIC AB 2-0 CT1 TAPERPNT 27 (SUTURE) ×1 IMPLANT
SUT VIC AB 4-0 KS 27 (SUTURE) ×3 IMPLANT
SYR CONTROL 10ML LL (SYRINGE) ×3 IMPLANT
TOWEL OR 17X24 6PK STRL BLUE (TOWEL DISPOSABLE) ×9 IMPLANT
TRAY FOLEY CATH SILVER 16FR (SET/KITS/TRAYS/PACK) ×3 IMPLANT
WATER STERILE IRR 1000ML POUR (IV SOLUTION) ×3 IMPLANT

## 2018-11-17 NOTE — Op Note (Addendum)
Operative Note   SURGERY DATE: 11/17/2018  PRE-OP DIAGNOSIS:  *Pregnancy at [redacted]w[redacted]d *Complete Breech Presentation   POST-OP DIAGNOSIS: Same. Fibroid uterus   PROCEDURE: primary low transverse cesarean section via pfannenstiel skin incision with double layer uterine closure  SURGEON: Surgeon(s) and Role:    * Aletha Halim, MD - Primary  ASSISTANT:    * Fair, Marin Shutter, MD - Assisting  ANESTHESIA: spinal  ESTIMATED BLOOD LOSS: 643 mL  DRAINS: 364mL UOP via indwelling foley  TOTAL IV FLUIDS: 2500 mL crystalloid  VTE PROPHYLAXIS: SCDs to bilateral lower extremities  ANTIBIOTICS: Two grams of Cefazolin were given, within 1 hour of skin incision  SPECIMENS: None  COMPLICATIONS: None   FINDINGS: No intra-abdominal adhesions were noted. Grossly normal uterus, tubes and ovaries. Anterior subserosal fibroid measuring approximately 4x4 cm that was located in the mid to lower uterine segment. Anterior amniotic fluid, breech female infant, weight 2950gm, intact placenta. APGAR (1 MIN): 9   APGAR (5 MINS): 9    PROCEDURE IN DETAIL: The patient was taken to the operating room where anesthesia was administered and normal fetal heart tones were confirmed. She was then prepped and draped in the normal fashion in the dorsal supine position with a leftward tilt.  After a time out was performed, a pfannensteil  skin incision was made with the scalpel and carried through to the underlying layer of fascia. The fascia was then incised at the midline and this incision was extended laterally with the mayo scissors. Attention was turned to the superior aspect of the fascial incision which was grasped with the kocher clamps x 2, tented up and the rectus muscles were dissected off with the Bovie. In a similar fashion the inferior aspect of the fascial incision was grasped with the kocher clamps, tented up and the rectus muscles dissected off with the mayo scissors. The rectus muscles were then separated in  the midline and the peritoneum was entered bluntly.  The uterus was inspected and I felt I had enough remove below the fibroid for a low transverse incision. The bladder blade was inserted and the vesicouterine peritoneum was identified, tented up and entered with the metzenbaum scissors. This incision was extended laterally and the bladder flap was created digitally. The bladder blade was reinserted.  A low transverse hysterotomy was made with the scalpel until the endometrial cavity was breached and the amniotic sac ruptured with the Allis clamp, yielding clear amniotic fluid. This incision was extended bluntly and the hips first delivered followed by gentle extraction of legs and then arms one by one, the head followed easily.The cord was clamped x 2 and cut, and the infant was handed to the awaiting pediatricians, after delayed cord clamping was done.  The placenta was then gradually expressed from the uterus and then the uterus was exteriorized and cleared of all clots and debris. The hysterotomy was repaired with a running suture of 1-0 . A second imbricating layer of 1-0  suture was then placed. Several figure-of-eight sutures with 0 Vicryl and 1-0 Monocryl were added to achieve excellent hemostasis.   The uterus and adnexa were then returned to the abdomen, and the hysterotomy and all operative sites were reinspected and excellent hemostasis was noted after irrigation and suction of the abdomen with warm saline.  The peritoneum was closed with a running stitch of 3-0 Vicryl. The fascia was reapproximated with 0 Vicryl in a simple running fashion bilaterally. The subcutaneous layer was then reapproximated with interrupted sutures of 2-0 plain  gut, and the skin was then closed with 4-0 monocryl, in a subcuticular fashion.  The patient  tolerated the procedure well. Sponge, lap, needle, and instrument counts were correct x 2. The patient was transferred to the recovery room awake, alert and breathing  independently in stable condition.  Barrington Ellison, MD [facility] Family Medicine Fellow, [facility] for [place], [place] with above. I was present and scrubbed for the entire procedure.   Durene Romans MD Attending Center for [employer] farm manager)

## 2018-11-17 NOTE — Lactation Note (Signed)
This note was copied from a baby's chart. Lactation Consultation Note  Patient Name: Girl Chaselyn Marone [id number] Date: 11/17/2018 Reason for consult: Initial assessment;Term P2, 4 hour female infant. Infant had one stools since birth. Per mom, she breastfed her 23 year old for one year. Mom had DEBP at home. Per mom, infant latched well in L&D, she feels breastfeeding is going well and infant  breastfed for 20 minutes. LC did not observe latch at this time, infant asleep in basinet and been less than 3 hours since infant last breastfed. Mom knows to breastfeed infant according hunger cues, 8 to 12 times within 24 hours and on demand. Mom knows to call Nurse or [place] if  she has questions, concerns or need assistance with latching infant to breast. Parents will continue to do as much STS as possible. Reviewed Baby & Me book's Breastfeeding Basics.  Mom made aware of O/P services, breastfeeding support groups, community resources, and our phone # for post-discharge questions.   Maternal Data Formula Feeding for Exclusion: No Has patient been taught Hand Expression?: Yes(Per mom, she knows how to hand express.)  Feeding    LATCH Score                   Interventions Interventions: Breast feeding basics reviewed;Hand express;DEBP;Skin to skin  Lactation Tools Discussed/Used WIC Program: No   Consult Status Consult Status: Follow-up Date: 11/18/18 Follow-up type: In-patient    Vicente Serene 11/17/2018, 10:31 PM

## 2018-11-17 NOTE — Anesthesia Procedure Notes (Signed)
Spinal  Patient location during procedure: OR Staffing Anesthesiologist: Jantzen Pilger E, MD Performed: anesthesiologist  Preanesthetic Checklist Completed: patient identified, surgical consent, pre-op evaluation, timeout performed, IV checked, risks and benefits discussed and monitors and equipment checked Spinal Block Patient position: sitting Prep: site prepped and draped and DuraPrep Patient monitoring: continuous pulse ox, blood pressure and heart rate Approach: midline Location: L3-4 Injection technique: single-shot Needle Needle type: Pencan  Needle gauge: 24 G Needle length: 9 cm Additional Notes Functioning IV was confirmed and monitors were applied. Sterile prep and drape, including hand hygiene and sterile gloves were used. The patient was positioned and the spine was prepped. The skin was anesthetized with lidocaine.  Free flow of clear CSF was obtained prior to injecting local anesthetic into the CSF. The needle was carefully withdrawn. The patient tolerated the procedure well.      

## 2018-11-17 NOTE — Transfer of Care (Signed)
Immediate Anesthesia Transfer of Care Note  Patient: Erin French  Procedure(s) Performed: CESAREAN SECTION (N/A )  Patient Location: PACU  Anesthesia Type:Spinal  Level of Consciousness: awake, alert  and oriented  Airway & Oxygen Therapy: Patient Spontanous Breathing  Post-op Assessment: Report given to RN and Post -op Vital signs reviewed and stable  Post vital signs: Reviewed and stable  Last Vitals:  Vitals Value Taken Time  BP 108/86 11/17/18 1927  Temp    Pulse 99 11/17/18 1929  Resp 20 11/17/18 1929  SpO2 100 % 11/17/18 1929  Vitals shown include unvalidated device data.  Last Pain:  Vitals:   11/17/18 1058  TempSrc: Oral         Complications: No apparent anesthesia complications

## 2018-11-17 NOTE — Progress Notes (Signed)
OB Note  I'm taking over for Dr. Ernestina Patches, and I introduced myself to Erin French and her support person. Still NPO. Bedside u/s with complete breech with head in RUQ and spine on maternal right. Normal FHR, +FM and subjectively normal AFI.  Offered patient another ECV trial and she declines. Consent signed with patient. Can proceed when OR is ready.  Durene Romans MD Attending Center for [employer] (Faculty Practice) 11/17/2018 Time: [phone]

## 2018-11-17 NOTE — Anesthesia Preprocedure Evaluation (Signed)
Anesthesia Evaluation  Patient identified by MRN, date of birth, ID band Patient awake    Reviewed: Allergy & Precautions, NPO status , Patient's Chart, lab work & pertinent test results  History of Anesthesia Complications Negative for: history of anesthetic complications  Airway Mallampati: II  TM Distance: >3 FB Neck ROM: Full    Dental   Pulmonary neg pulmonary ROS,    Pulmonary exam normal        Cardiovascular negative cardio ROS Normal cardiovascular exam     Neuro/Psych negative neurological ROS  negative psych ROS   GI/Hepatic negative GI ROS, Neg liver ROS,   Endo/Other  negative endocrine ROS  Renal/GU negative Renal ROS  negative genitourinary   Musculoskeletal negative musculoskeletal ROS (+)   Abdominal   Peds  Hematology negative hematology ROS (+)   Anesthesia Other Findings   Reproductive/Obstetrics (+) Pregnancy                            Anesthesia Physical Anesthesia Plan  ASA: II  Anesthesia Plan: Spinal   Post-op Pain Management:    Induction:   PONV Risk Score and Plan: 3 and Ondansetron and Treatment may vary due to age or medical condition  Airway Management Planned: Natural Airway  Additional Equipment: None  Intra-op Plan:   Post-operative Plan:   Informed Consent: I have reviewed the patients History and Physical, chart, labs and discussed the procedure including the risks, benefits and alternatives for the proposed anesthesia with the patient or authorized representative who has indicated his/her understanding and acceptance.       Plan Discussed with:   Anesthesia Plan Comments:        Anesthesia Quick Evaluation

## 2018-11-17 NOTE — H&P (Signed)
Obstetric Preoperative History and Physical  Erin French is a 23 y.o. [id number] with IUP at [redacted]w[redacted]d presenting for scheduled cesarean section.  Reports good fetal movement, no bleeding, no contractions, no leaking of fluid.  No acute preoperative concerns.    Cesarean Section Indication: malpresentation: breech  Prenatal Course Source of Care: [facility] with onset of care at 15 weeks Pregnancy complications or risks: Patient Active Problem List   Diagnosis Date Noted  . S/P primary low transverse C-section 11/01/2018  . Malpresentation before onset of labor 11/01/2018  . Atypical squamous cell changes of undetermined significance (ASCUS) on cervical cytology with positive high risk human papilloma virus (HPV) 06/25/2018  . Encounter for supervision of normal pregnancy, unspecified, unspecified trimester 06/07/2018   She plans to breastfeed She desires Nexplanon for postpartum contraception.   Prenatal labs and studies: ABO, Rh: --/--/O POS (09/22 0859) Antibody: NEG (09/22 0859) Rubella: 16.50 (04/14 1611) RPR: NON REACTIVE (09/22 0856)  HBsAg: Negative (04/14 1611)  HIV: NON REACTIVE (09/22 0856)  GBS:--/Positive (09/02 0355) 2 hr Glucola  WNL Genetic screening normal Anatomy US 06/30/2018: [redacted]w[redacted]d, posterior placenta, variable presentation, normal growth, no abnormalities, EFW 37%  Most recent US 9/8: Impression  Normal interval growth.  Previous scan suggested oligohydramnios- today the aminotic fluid is normal  Breech presentation  Biophysical profile 8/8  Prenatal Transfer Tool  Maternal Diabetes: No Genetic Screening: Normal Maternal Ultrasounds/Referrals: Normal Fetal Ultrasounds or other Referrals:  None Maternal Substance Abuse:  No Significant Maternal Medications:  None Significant Maternal Lab Results: Group B Strep positive  Past Medical History:  Diagnosis Date  . Chlamydia 4/12  . Medical history non-contributory     Past Surgical History:  Procedure  Laterality Date  . INDUCED ABORTION    . NO PAST SURGERIES      OB History  Gravida Para Term Preterm AB Living  4 1 1   2 1   SAB TAB Ectopic Multiple Live Births        0 1    # Outcome Date GA Lbr Len/2nd Weight Sex Delivery Anes PTL Lv  4 Current           3 Term 04/12/15 [redacted]w[redacted]d / 00:41 3110 g M Vag-Spont None  LIV  2 AB           1 AB             Social History   Socioeconomic History  . Marital status: Single    Spouse name: Not on file  . Number of children: Not on file  . Years of education: Not on file  . Highest education level: Not on file  Occupational History  . Not on file  Social Needs  . Financial resource strain: Not on file  . Food insecurity    Worry: Not on file    Inability: Not on file  . Transportation needs    Medical: Not on file    Non-medical: Not on file  Tobacco Use  . Smoking status: Never Smoker  . Smokeless tobacco: Never Used  Substance and Sexual Activity  . Alcohol use: Not Currently    Alcohol/week: 0.0 standard drinks    Comment: Last drink December 2019  . Drug use: No  . Sexual activity: Yes    Partners: Male    Birth control/protection: None    Comment: INTERCOURSE AGE 30, SEXUAL PARTNERS LESS THAN 5  Lifestyle  . Physical activity    Days per week: Not on file  Minutes per session: Not on file  . Stress: Not on file  Relationships  . Social Herbalist on phone: Not on file    Gets together: Not on file    Attends religious service: Not on file    Active member of club or organization: Not on file    Attends meetings of clubs or organizations: Not on file    Relationship status: Not on file  Other Topics Concern  . Not on file  Social History Narrative  . Not on file    Family History  Problem Relation Age of Onset  . Hypertension Father   . Schizophrenia Mother   . Hypertension Maternal Grandmother     Medications Prior to Admission  Medication Sig Dispense Refill Last Dose  . Prenatal  MV-Min-FA-Omega-3 (PRENATAL GUMMIES/DHA & FA) 0.4-32.5 MG CHEW Chew by mouth.   11/16/2018 at Unknown time  . terconazole (TERAZOL 3) 0.8 % vaginal cream Place 1 applicator vaginally at bedtime. Apply nightly for three nights. 20 g 0     No Known Allergies  Review of Systems: Pertinent items noted in HPI and remainder of comprehensive ROS otherwise negative.  Physical Exam: BP 130/78   Pulse (!) 117   Temp 98.6 F (37 C) (Oral)   Ht 5\' 4"  (1.626 m)   Wt 72.6 kg   LMP 02/16/2018   SpO2 100%   Breastfeeding Yes   BMI 27.46 kg/m  FHR by Doppler: 154 bpm CONSTITUTIONAL: Well-developed, well-nourished female in no acute distress.  HENT:  Normocephalic, atraumatic, External right and left ear normal. Oropharynx is clear and moist EYES: Conjunctivae and EOM are normal. No scleral icterus.  NECK: Normal range of motion, supple, no masses SKIN: Skin is warm and dry. No rash noted. Not diaphoretic. No erythema. No pallor. [place]: Alert and oriented to person, place, and time. Normal reflexes, muscle tone coordination. No cranial nerve deficit noted. PSYCHIATRIC: Normal mood and affect. Normal behavior. Normal judgment and thought content. CARDIOVASCULAR: Normal heart rate noted RESPIRATORY: Effort and breath sounds normal, no problems with respiration noted ABDOMEN: Soft, nontender, nondistended, gravid.  PELVIC: Deferred MUSCULOSKELETAL: Normal range of motion. No edema and no tenderness. 2+ distal pulses.   Pertinent Labs/Studies:   Results for orders placed or performed during the hospital encounter of 11/15/18 (from the past 72 hour(s))  CBC     Status: Abnormal   Collection Time: 11/15/18  8:56 AM  Result Value Ref Range   WBC 5.1 4.0 - 10.5 K/uL   RBC 3.57 (L) 3.87 - 5.11 MIL/uL   Hemoglobin 9.8 (L) 12.0 - 15.0 g/dL   HCT 30.6 (L) 36.0 - 46.0 %   MCV 85.7 80.0 - 100.0 fL   MCH 27.5 26.0 - 34.0 pg   MCHC 32.0 30.0 - 36.0 g/dL   RDW 13.0 11.5 - 15.5 %   Platelets 385 150 -  400 K/uL   nRBC 0.0 0.0 - 0.2 %    Comment: Performed at [facility], [place] [address], [place], [place] [address]  RPR     Status: None   Collection Time: 11/15/18  8:56 AM  Result Value Ref Range   RPR Ser Ql NON REACTIVE NON REACTIVE    Comment: Performed at [facility], [place] [address], [place], [place] [address]  Rapid HIV screen (HIV 1/2 Ab+Ag)     Status: None   Collection Time: 11/15/18  8:56 AM  Result Value Ref Range  HIV-1 P24 Antigen - HIV24 NON REACTIVE NON REACTIVE    Comment: (NOTE) Detection of p24 may be inhibited by biotin in the sample, causing false negative results in acute infection.    HIV 1/2 Antibodies NON REACTIVE NON REACTIVE   Interpretation (HIV Ag Ab)      A non reactive test result means that HIV 1 or HIV 2 antibodies and HIV 1 p24 antigen were not detected in the specimen.    Comment: Performed at [facility], [place] [address], [place], [place] [address]  SARS CORONAVIRUS 2 (TAT 6-24 HRS) Nasopharyngeal Nasopharyngeal Swab     Status: None   Collection Time: 11/15/18  8:58 AM   Specimen: Nasopharyngeal Swab  Result Value Ref Range   SARS Coronavirus 2 NEGATIVE NEGATIVE    Comment: (NOTE) SARS-CoV-2 target nucleic acids are NOT DETECTED. The SARS-CoV-2 RNA is generally detectable in upper and lower respiratory specimens during the acute phase of infection. Negative results do not preclude SARS-CoV-2 infection, do not rule out co-infections with other pathogens, and should not be used as the sole basis for treatment or other patient management decisions. Negative results must be combined with clinical observations, patient history, and epidemiological information. The expected result is Negative. Fact Sheet for Patients: SugarRoll.be Fact Sheet for Healthcare Providers: https://www.woods-mathews.com/ This test is not yet approved or cleared by the [place] FDA and  has been authorized  for detection and/or diagnosis of SARS-CoV-2 by FDA under an Emergency Use Authorization (EUA). This EUA will remain  in effect (meaning this test can be used) for the duration of the COVID-19 declaration under Section 56 4(b)(1) of the Act, 21 U.S.C. section 360bbb-3(b)(1), unless the authorization is terminated or revoked sooner. Performed at [facility], [place] [address] [place], [place] [address]   Type and screen [place]     Status: None   Collection Time: 11/15/18  8:59 AM  Result Value Ref Range   ABO/RH(D) O POS    Antibody Screen NEG    Sample Expiration      11/18/2018,2359 Performed at [facility], [place] [address] [place], [place] [address]     Assessment and Plan: Prestyn Vanwagner is a 23 y.o. [id number] at [redacted]w[redacted]d being admitted for scheduled cesarean section. The risks of cesarean section discussed with the patient included but were not limited to: bleeding which may require transfusion or reoperation; infection which may require antibiotics; injury to bowel, bladder, ureters or other surrounding organs; injury to the fetus; need for additional procedures including hysterectomy in the event of a life-threatening hemorrhage; placental abnormalities with subsequent pregnancies, incisional problems, thromboembolic phenomenon and other postoperative/anesthesia complications. The patient concurred with the proposed plan, giving informed written consent for the procedure. Patient has been NPO since 0915 she will remain NPO for procedure. Anesthesia and OR aware. Preoperative prophylactic antibiotics and SCDs ordered on call to the OR. To OR when ready.   Pregnancy Complications: Breech presentation, concern for oligo resolved with [place] 9/8. Pap with ASCUS, HPV+, repeat in 1 year (05/2019). ID: [id number] Contraception: Nexplanon  Barrington Ellison, MD OB Family Medicine Fellow, [facility] for [facility], [place]

## 2018-11-17 NOTE — Discharge Summary (Addendum)
Postpartum Discharge Summary     Patient Name: Erin French DOB: [birth year] MRN: [id number]  Date of admission: 11/17/2018 Delivering Provider: Aletha Halim   Date of discharge: 11/19/2018  Admitting diagnosis: faiBreech Intrauterine pregnancy: [redacted]w[redacted]d    Secondary diagnosis:  Active Problems:   S/P primary low transverse C-section   Malpresentation before onset of labor  Additional problems: None     Discharge diagnosis: Term Pregnancy Delivered                                                                                                Post partum procedures: feraheme infusion & nexplanon placement  Augmentation: NA  Complications: None  Hospital course:  Sceduled C/S   23y.o. yo [id number] admitted to the hospital 11/17/2018 for scheduled cesarean section with the following indication:Malpresentation: Complete Breech. Membrane Rupture Time/Date: 6:23 PM ,11/17/2018   Patient delivered a Viable infant.11/17/2018  Details of operation can be found in separate operative note.  Pateint had an uncomplicated postpartum course.  She is ambulating, tolerating a regular diet, passing flatus, and urinating well. Pt's Hb dropped frok 9.8 to 7.2 post operatively. Patient has been started on ferrous sulphate BID and had one dose of Feraheme before being discharged. Patient is discharged home in stable condition on  11/19/18        Delivery time: 6:25 PM    Magnesium Sulfate received: No BMZ received: No Rhophylac:No MMR:No Transfusion: Feraheme given prior to discharge   Physical exam  Vitals:   11/18/18 1214 11/18/18 1625 11/18/18 2053 11/19/18 0523  BP: 114/65 121/70 116/65 116/72  Pulse: 91 91 85 72  Resp: '16 16 18 18  '$ Temp: 98.1 F (36.7 C) 98 F (36.7 C) 98.4 F (36.9 C) 98.5 F (36.9 C)  TempSrc: Oral Oral Oral Oral  SpO2: 99% 100% 99% 99%  Weight:      Height:       General: alert, cooperative and no distress  Cardiac: RRR, no rubs or  gallops Pulm: chest clear on ausc, no crackles or wheeze, no respiratory distress  GI: abdo soft, mildly tender, no guarding, bowel sounds present  Lochia: appropriate Uterine Fundus: firm Incision: No significant erythema, Dressing is clean, dry, and intact DVT Evaluation: No cords or calf tenderness. No significant calf/ankle edema. Labs: Lab Results  Component Value Date   WBC 8.6 11/18/2018   HGB 7.2 (L) 11/18/2018   HCT 22.2 (L) 11/18/2018   MCV 85.1 11/18/2018   PLT 316 11/18/2018   CMP Latest Ref Rng & Units 11/18/2018  Glucose 65 - 99 mg/dL -  BUN 6 - 20 mg/dL -  Creatinine 0.44 - 1.00 mg/dL 0.75  Sodium 135 - 145 mmol/L -  Potassium 3.5 - 5.1 mmol/L -  Chloride 101 - 111 mmol/L -  CO2 22 - 32 mmol/L -  Calcium 8.9 - 10.3 mg/dL -  Total Protein 6.5 - 8.1 g/dL -  Total Bilirubin 0.3 - 1.2 mg/dL -  Alkaline Phos 38 - 126 U/L -  AST 12 - 32 U/L -  ALT 5 - 32  U/L -    Discharge instruction: per After Visit Summary and "Baby and Me Booklet".  After visit meds:  Allergies as of 11/19/2018   No Known Allergies     Medication List    STOP taking these medications   terconazole 0.8 % vaginal cream Commonly known as: TERAZOL 3     TAKE these medications   acetaminophen 325 MG tablet Commonly known as: TYLENOL Take 2 tablets (650 mg total) by mouth every 4 (four) hours as needed for mild pain (temperature > 101.5.). What changed:   medication strength  how much to take  when to take this  reasons to take this   ferrous sulfate 325 (65 FE) MG tablet Take 1 tablet (325 mg total) by mouth 2 (two) times daily with a meal.   ibuprofen 200 MG tablet Commonly known as: Motrin IB Take 3 tablets (600 mg total) by mouth every 6 (six) hours as needed.   Prenatal Gummies/DHA & FA 0.4-32.5 MG Chew Chew by mouth.   senna-docusate 8.6-50 MG tablet Commonly known as: Senokot-S Take 2 tablets by mouth 2 (two) times daily.       Diet: routine diet  Activity:  Advance as tolerated. Pelvic rest for 6 weeks.   Outpatient follow up:4 weeks Follow up Appt: Future Appointments  Date Time Provider [place]  12/05/2018  9:45 AM Constant, Vickii Chafe, MD [facility] None  12/16/2018 10:15 AM Luvenia Redden, PA-C [facility] None   Follow up Visit:   Please schedule this patient for Postpartum visit in: 4 weeks with the following provider: Any provider For C/S patients schedule nurse incision check in weeks 2 weeks: yes Low risk pregnancy complicated by: malpresentation. Delivery mode:  CS Anticipated Birth Control:  Nexplanon done inpatient PP Procedures needed: Incision check  Schedule Integrated BH visit: no      Newborn Data: Live born female  Birth Weight: 29 APGAR: 16, 9  Newborn Delivery   Birth date/time: 11/17/2018 18:25:00 Delivery type: C-Section, Low Transverse Trial of labor: No C-section categorization: Primary      Baby Feeding: Breast Disposition:home with mother   11/19/2018 Lattie Haw, MD  Provider attestation I have seen and examined this patient and agree with above documentation in the resident's note.   Ellese Julius is a 23 y.o. [id number] s/p LTCS.  Pain is well controlled. Plan for birth control is inpatient nexplanon. Method of Feeding: breast  PE:  Gen: well appearing Heart: reg rate Lungs: normal WOB Fundus firm Ext: no pain, no edema  Recent Labs    11/18/18 0610 11/19/18 0848  HGB 7.2* 8.8*  HCT 22.2* 27.5*    Assessment S/p LTCS PPD # 2  Plan: - discharge home - postpartum care discussed - f/u appts scheduled for incision check & PP visit -pt feels better after feraheme infusion & requesting to be discharged home -Rx ibuprofen, oxycodone, ferrous sulfate, & sennakot  Jorje Guild, NP 2:25 PM   Attestation of Attending Supervision of Advanced Practice Provider (PA/CNM/NP): Evaluation and management procedures were performed by the Advanced Practice Provider under my supervision  and collaboration.  I have reviewed the Advanced Practice Provider's note and chart, and I agree with the management and plan.  Verita Schneiders, MD, [place] for [employer], [place]

## 2018-11-18 DIAGNOSIS — Z30017 Encounter for initial prescription of implantable subdermal contraceptive: Secondary | ICD-10-CM

## 2018-11-18 LAB — CBC
HCT: 22.2 % — ABNORMAL LOW (ref 36.0–46.0)
Hemoglobin: 7.2 g/dL — ABNORMAL LOW (ref 12.0–15.0)
MCH: 27.6 pg (ref 26.0–34.0)
MCHC: 32.4 g/dL (ref 30.0–36.0)
MCV: 85.1 fL (ref 80.0–100.0)
Platelets: 316 10*3/uL (ref 150–400)
RBC: 2.61 MIL/uL — ABNORMAL LOW (ref 3.87–5.11)
RDW: 12.8 % (ref 11.5–15.5)
WBC: 8.6 10*3/uL (ref 4.0–10.5)
nRBC: 0 % (ref 0.0–0.2)

## 2018-11-18 LAB — CREATININE, SERUM
Creatinine, Ser: 0.75 mg/dL (ref 0.44–1.00)
GFR calc Af Amer: >60 mL/min (ref >60)
GFR calc non Af Amer: >60 mL/min (ref >60)

## 2018-11-18 MED ORDER — ETONOGESTREL 68 MG ~~LOC~~ IMPL
68.0000 mg | DRUG_IMPLANT | Freq: Once | SUBCUTANEOUS | Status: AC
  Administered 2018-11-18: 68 mg via SUBCUTANEOUS
  Filled 2018-11-18: qty 1

## 2018-11-18 MED ORDER — LIDOCAINE HCL 1 % IJ SOLN
0.0000 mL | Freq: Once | INTRAMUSCULAR | Status: AC | PRN
  Administered 2018-11-18: 20 mL via INTRADERMAL
  Filled 2018-11-18: qty 20

## 2018-11-18 MED ORDER — FERROUS SULFATE 325 (65 FE) MG PO TABS
325.0000 mg | ORAL_TABLET | Freq: Every day | ORAL | Status: DC
  Administered 2018-11-18 – 2018-11-19 (×2): 325 mg via ORAL
  Filled 2018-11-18 (×2): qty 1

## 2018-11-18 NOTE — Lactation Note (Signed)
This note was copied from a baby's chart. Lactation Consultation Note  Patient Name: Erin French [id number] Date: 11/18/2018 Reason for consult: Follow-up assessment  2000 - 2015 - I followed up with [name]. She states that her daughter, Erin French, has been breast feeding well thus far. Baby is now 3 hours old. [name] did not have up to date record of feedings, but estimates about 6 feedings in the first 24 hours. I recommended that she increase feedings to 8-12 feedings/day and wake baby to feed as needed. I reviewed feeding cues.  [name] breast fed her first, now three, for one year. She declined latch assistance at this time and denies pain with baby's latch.  [name] did ask when it would be acceptable to begin pumping. When I probed, she stated that she wanted to being to bank her milk. I advised her if her desire is to save milk, that pumping in the first three days would yield droplets to 20 mls. We discussed differences between colostrum and mature milk. She verbalized understanding. I offered to set up a pump for her if she would like to initiate pumping. She declined at this time.  Lactation has not viewed latch. Baby ate about 2 hours prior to my visit and was sleeping in the bassinet. I encouraged her to call lactation for assistance tonight. She verbalized understanding.   Interventions Interventions: Breast feeding basics reviewed   Consult Status Consult Status: Follow-up Date: 11/19/18 Follow-up type: In-patient    Lenore Manner 11/18/2018, 8:22 PM

## 2018-11-18 NOTE — Anesthesia Postprocedure Evaluation (Signed)
Anesthesia Post Note  Patient: Erin French  Procedure(s) Performed: CESAREAN SECTION (N/A )     Patient location during evaluation: PACU Anesthesia Type: Spinal Level of consciousness: awake and alert Pain management: pain level controlled Vital Signs Assessment: post-procedure vital signs reviewed and stable Respiratory status: spontaneous breathing and respiratory function stable Cardiovascular status: blood pressure returned to baseline and stable Postop Assessment: spinal receding Anesthetic complications: no    Last Vitals:  Vitals:   11/17/18 2315 11/18/18 0029  BP: (!) 126/93 (!) 129/54  Pulse: (!) 102 90  Resp: 20 18  Temp: 36.9 C 36.8 C  SpO2: 99% 100%    Last Pain:  Vitals:   11/18/18 0025  TempSrc:   PainSc: 0-No pain                 Nolon Nations

## 2018-11-18 NOTE — Procedures (Signed)
   Post-Placental Nexplanon Insertion Procedure Note  Patient was identified. Informed consent was signed, signed copy in chart. A time-out was performed.    The insertion site was identified 8-10 cm (3-4 inches) from the medial epicondyle of the humerus and 3-5 cm (1.25-2 inches) posterior to (below) the sulcus (groove) between the biceps and triceps muscles of the patient's right arm and marked. The site was prepped and draped in the usual sterile fashion. Pt was prepped with alcohol swab and then injected with 3 cc of 1% lidocaine. The site was prepped with betadine. Nexplanon removed form packaging,  Device confirmed in needle, then inserted full length of needle and withdrawn per handbook instructions. Provider and patient verified presence of the implant in the woman's arm by palpation. Pt insertion site was covered with steristrips/adhesive bandage and pressure bandage. There was minimal blood loss. Patient tolerated procedure well.  Patient was given post procedure instructions and Nexplanon user card with expiration date. Condoms were recommended for STI prevention. Patient was asked to keep the pressure dressing on for 24 hours to minimize bruising and keep the adhesive bandage on for 3-5 days. The patient verbalized understanding of the plan of care and agrees.   Lot # [id number] Expiration Date10/11/2020  Clarnce Flock OB Fellow

## 2018-11-18 NOTE — Progress Notes (Addendum)
Subjective: Postpartum Day 1: Cesarean Delivery   Patient is doing well. Endorses generalized pruritus. Abdominal pain is well controlled with Oxycodone and Tylenol. Patient is currently not up ad lib and has a foley. No bowel movements. Patient has light bleeding with no clots. Patient denies headache, dizziness, SOB, chest pain, lower extremity edema.  Objective: Vital signs in last 24 hours: Temp:  [98 F (36.7 C)-98.6 F (37 C)] 98.6 F (37 C) (09/25 0353) Pulse Rate:  [75-117] 75 (09/25 0353) Resp:  [0-29] 18 (09/25 0353) BP: (108-140)/(54-95) 119/62 (09/25 0353) SpO2:  [87 %-100 %] 100 % (09/25 0029) Weight:  [72.6 kg] 72.6 kg (09/24 1058)  Physical Exam:  General: alert, cooperative, appears stated age and no distress Lochia: appropriate Uterine Fundus: firm Incision: no significant drainage DVT Evaluation: No evidence of DVT seen on physical exam. Incision: no significant drainage  Recent Labs    11/15/18 0856 11/18/18 0610  HGB 9.8* 7.2*  HCT 30.6* 22.2*    Assessment/Plan: Status post Cesarean section. Doing well postoperatively.  Consider removing foley this morning to aid in ambulation and voiding. -- Total urine output 09/24-09/25: 2,575 mL Encourage patient to ambulate as tolerated. Consider starting ferrous sulfate 325 mg due to Hgb of 7.2 per CBC 11/15/2018. Breast feeding. Patient would like Nexplanon for birth control.  Continue current care.  Margarette Asal, PA-S 11/18/2018, 8:01 AM  I saw and evaluated the patient. I agree with the findings and the plan of care as documented in the student's note. Pain well-controlled currently. Will attempt to place Nexplanon later today. Hgb now 7.2, patient asymptomatic. Will start iron. Foley out as of a few minutes ago; will monitor.   Barrington Ellison, MD [facility] Family Medicine Fellow, [facility] for [employer], [place]

## 2018-11-19 ENCOUNTER — Encounter (HOSPITAL_COMMUNITY): Payer: Self-pay | Admitting: Obstetrics and Gynecology

## 2018-11-19 LAB — CBC
HCT: 27.5 % — ABNORMAL LOW (ref 36.0–46.0)
Hemoglobin: 8.8 g/dL — ABNORMAL LOW (ref 12.0–15.0)
MCH: 27.4 pg (ref 26.0–34.0)
MCHC: 32 g/dL (ref 30.0–36.0)
MCV: 85.7 fL (ref 80.0–100.0)
Platelets: 400 10*3/uL (ref 150–400)
RBC: 3.21 MIL/uL — ABNORMAL LOW (ref 3.87–5.11)
RDW: 13.1 % (ref 11.5–15.5)
WBC: 10.3 10*3/uL (ref 4.0–10.5)
nRBC: 0 % (ref 0.0–0.2)

## 2018-11-19 MED ORDER — SODIUM CHLORIDE 0.9 % IV SOLN
510.0000 mg | Freq: Once | INTRAVENOUS | Status: AC
  Administered 2018-11-19: 510 mg via INTRAVENOUS
  Filled 2018-11-19: qty 17

## 2018-11-19 MED ORDER — FERROUS SULFATE 325 (65 FE) MG PO TABS: 325.0000 mg | ORAL_TABLET | Freq: Two times a day (BID) | ORAL | 0 refills | Status: DC

## 2018-11-19 MED ORDER — SENNOSIDES-DOCUSATE SODIUM 8.6-50 MG PO TABS: 2.0000 | ORAL_TABLET | Freq: Two times a day (BID) | ORAL | 0 refills | Status: AC

## 2018-11-19 MED ORDER — IBUPROFEN 200 MG PO TABS: 600.0000 mg | ORAL_TABLET | Freq: Four times a day (QID) | ORAL | 0 refills | Status: DC | PRN

## 2018-11-19 MED ORDER — ACETAMINOPHEN 325 MG PO TABS: 650.0000 mg | ORAL_TABLET | ORAL | 0 refills | Status: AC | PRN

## 2018-11-19 MED ORDER — SODIUM CHLORIDE 0.9 % IV SOLN
510.0000 mg | Freq: Once | INTRAVENOUS | Status: DC
  Filled 2018-11-19: qty 17

## 2018-11-19 MED ORDER — IBUPROFEN 600 MG PO TABS: 600.0000 mg | ORAL_TABLET | Freq: Four times a day (QID) | ORAL | 0 refills | Status: DC | PRN

## 2018-11-19 MED ORDER — OXYCODONE HCL 5 MG PO TABS: 5.0000 mg | ORAL_TABLET | ORAL | 0 refills | Status: AC | PRN

## 2018-11-19 MED ORDER — IBUPROFEN 600 MG PO TABS: 600.0000 mg | ORAL_TABLET | Freq: Four times a day (QID) | ORAL | Status: DC | PRN

## 2018-11-19 NOTE — Discharge Instructions (Signed)

## 2018-11-19 NOTE — Progress Notes (Signed)
Post Partum Day 2 Subjective: Patient up ad lib & tolerating POs. No issues with bowel movements. Does complain of dizziness. Pain well controlled with meds.   Objective: Blood pressure 117/72, pulse 72, temperature 98.5 F (36.9 C), temperature source Oral, resp. rate 18, height 5\' 4"  (1.626 m), weight 72.6 kg, last menstrual period 02/16/2018, SpO2 99 %, currently breastfeeding.  Physical Exam:  General: alert, cooperative and appears stated age 23: appropriate Uterine Fundus: firm Incision: healing well, no significant drainage DVT Evaluation: No evidence of DVT seen on physical exam.  Recent Labs    11/18/18 0610 11/19/18 0848  HGB 7.2* 8.8*  HCT 22.2* 27.5*    Assessment/Plan: Hemoglobin improved this morning but patient complains of dizziness & agreeable to feraheme transfusion.  Plan to be discharged later today if feels better after infusion   LOS: 2 days   Jorje Guild 11/19/2018, 10:49 AM

## 2018-11-21 DIAGNOSIS — D259 Leiomyoma of uterus, unspecified: Secondary | ICD-10-CM

## 2018-11-21 HISTORY — DX: Leiomyoma of uterus, unspecified: D25.9

## 2018-12-05 ENCOUNTER — Ambulatory Visit: Payer: Medicaid Other | Admitting: Obstetrics and Gynecology

## 2018-12-06 ENCOUNTER — Other Ambulatory Visit: Payer: Self-pay

## 2018-12-06 ENCOUNTER — Encounter: Payer: Self-pay | Admitting: Obstetrics and Gynecology

## 2018-12-06 ENCOUNTER — Ambulatory Visit (INDEPENDENT_AMBULATORY_CARE_PROVIDER_SITE_OTHER): Payer: Medicaid Other | Admitting: Obstetrics and Gynecology

## 2018-12-06 VITALS — BP 121/82 | HR 90 | Wt 142.0 lb

## 2018-12-06 DIAGNOSIS — Z9889 Other specified postprocedural states: Secondary | ICD-10-CM

## 2018-12-06 NOTE — Progress Notes (Signed)
Pt presents for Incision Check Today  PP visit scheduled 12/16/18.  CC: None

## 2018-12-06 NOTE — Progress Notes (Signed)
23 yo P2012 s/p cesarean section on 11/17/18 due to fetal malpresentation here for incision check. Patient reports feeling well. Pain is well controlled with ibuprofen. She denies fever, or abnormal drainage from her incision. Patient receives help from her cousin  Past Medical History:  Diagnosis Date  . Chlamydia 4/12   Past Surgical History:  Procedure Laterality Date  . CESAREAN SECTION N/A 11/17/2018   Procedure: CESAREAN SECTION;  Surgeon: Aletha Halim, MD;  Location: [facility] LD ORS;  Service: Obstetrics;  Laterality: N/A;  . INDUCED ABORTION     Family History  Problem Relation Age of Onset  . Hypertension Father   . Schizophrenia Mother   . Hypertension Maternal Grandmother    Social History   Tobacco Use  . Smoking status: Never Smoker  . Smokeless tobacco: Never Used  Substance Use Topics  . Alcohol use: Not Currently    Alcohol/week: 0.0 standard drinks    Comment: Last drink December 2019  . Drug use: No   ROS See pertinent in HPI  Blood pressure 121/82, pulse 90, weight 142 lb (64.4 kg), last menstrual period 02/16/2018, currently breastfeeding. GENERAL: Well-developed, well-nourished female in no acute distress.  ABDOMEN: Soft, nontender, nondistended.  Incision: no erythema, induration or drainage. Steri-strips still in place and patient was instructed to remove them in the shower EXTREMITIES: No cyanosis, clubbing, or edema, 2+ distal pulses.  A/P 23 yo here for incision check - Incision healing well - Wound care instructions provided - Patient received Nexplanon prior to discharge from hospital - Patient is breastfeeding without issues

## 2018-12-16 ENCOUNTER — Ambulatory Visit: Payer: Medicaid Other | Admitting: Medical

## 2019-01-02 ENCOUNTER — Telehealth: Payer: Medicaid Other | Admitting: Obstetrics and Gynecology

## 2019-01-03 ENCOUNTER — Telehealth: Payer: Self-pay | Admitting: Obstetrics and Gynecology

## 2019-03-02 ENCOUNTER — Telehealth (INDEPENDENT_AMBULATORY_CARE_PROVIDER_SITE_OTHER): Payer: Medicaid Other | Admitting: Obstetrics

## 2019-03-02 DIAGNOSIS — Z1389 Encounter for screening for other disorder: Secondary | ICD-10-CM

## 2019-03-02 NOTE — Progress Notes (Signed)
Pt is about 3-4 months PP. Pt received Nexplanon before leaving hospital.  Pt is breast feeding.  Pt has no concerns today.

## 2019-03-04 ENCOUNTER — Encounter: Payer: Self-pay | Admitting: Obstetrics

## 2019-03-04 NOTE — Progress Notes (Signed)
Subjective:     Erin French is a 24 y.o. female who presents for a postpartum visit. She is 4 weeks postpartum following a low cervical transverse Cesarean section. I have fully reviewed the prenatal and intrapartum course. The delivery was at 26 gestational weeks. Outcome: primary cesarean section, low transverse incision for breech presentation. Anesthesia: spinal. Postpartum course has been normal. Baby's course has been normal. Baby is feeding by breast. Bleeding no bleeding. Bowel function is normal. Bladder function is normal. Patient is not sexually active. Contraception method is Nexplanon. Postpartum depression screening: negative.  Tobacco, alcohol and substance abuse history reviewed.  Adult immunizations reviewed including TDAP, rubella and varicella.  The following portions of the patient's history were reviewed and updated as appropriate: allergies, current medications, past family history, past medical history, past social history, past surgical history and problem list.  Review of Systems A comprehensive review of systems was negative.   Objective:      General:  alert and no distress   Breasts:  inspection negative, no nipple discharge or bleeding, no masses or nodularity palpable  Lungs: clear to auscultation bilaterally  Heart:  regular rate and rhythm, S1, S2 normal, no murmur, click, rub or gallop  Abdomen: soft, non-tender; bowel sounds normal; no masses,  no organomegaly.  Incision clean, dry and intact.    50% of 15 min visit spent on counseling and coordination of care.   Assessment:     1. Postpartum care following cesarean delivery - doing well  Plan:    1. Contraception: Nexplanon 2. Continue PNV's 3. Follow up in: 4 weeks or as needed.   Healthy lifestyle practices reviewed  Shelly Bombard, MD 03/04/2019 4:17 PM

## 2019-04-27 ENCOUNTER — Other Ambulatory Visit (HOSPITAL_COMMUNITY)
Admission: RE | Admit: 2019-04-27 | Discharge: 2019-04-27 | Disposition: A | Discharge status: Home / Self Care | Payer: Medicaid Other | Source: Ambulatory Visit | Attending: Obstetrics and Gynecology | Admitting: Obstetrics and Gynecology

## 2019-04-27 ENCOUNTER — Ambulatory Visit: Payer: Medicaid Other | Admitting: *Deleted

## 2019-04-27 ENCOUNTER — Other Ambulatory Visit: Payer: Self-pay

## 2019-04-27 DIAGNOSIS — Z113 Encounter for screening for infections with a predominantly sexual mode of transmission: Secondary | ICD-10-CM | POA: Diagnosis not present

## 2019-04-27 NOTE — Progress Notes (Signed)
Agree with A & P. 

## 2019-04-27 NOTE — Progress Notes (Unsigned)
Pt is in office for STD screen. Pt states she is having some irregular bleeding.   Pt currently has Nexplanon in place and this is the first time she has had any irregular bleeding.  Pt states she just wants a check today, pt would like full swab ordered today.  Pt preformed self swab today and will be sent to lab. Pt made aware she will be called with any abnormal results.  Pt asked about her last pap smear. Pt had pap last year with ASCUS results.  Pt made aware she may return to office any time after 4/14 for routine exam with pap.  Advised to schedule at check out if she chooses.   Pt has no other concerns and states understanding.

## 2019-04-28 LAB — CERVICOVAGINAL ANCILLARY ONLY
Bacterial Vaginitis (gardnerella): NEGATIVE
Candida Glabrata: NEGATIVE
Candida Vaginitis: POSITIVE — AB
Chlamydia: NEGATIVE
Comment: NEGATIVE
Comment: NEGATIVE
Comment: NEGATIVE
Comment: NEGATIVE
Comment: NEGATIVE
Comment: NORMAL
Neisseria Gonorrhea: NEGATIVE
Trichomonas: NEGATIVE

## 2019-05-02 ENCOUNTER — Other Ambulatory Visit: Payer: Self-pay

## 2019-05-02 MED ORDER — FLUCONAZOLE 150 MG PO TABS: ORAL_TABLET | ORAL | 0 refills | Status: DC

## 2019-05-02 NOTE — Progress Notes (Signed)
Sent rx for diflucan per provider order

## 2019-06-12 ENCOUNTER — Other Ambulatory Visit: Payer: Self-pay

## 2019-06-12 ENCOUNTER — Other Ambulatory Visit (HOSPITAL_COMMUNITY)
Admission: RE | Admit: 2019-06-12 | Discharge: 2019-06-12 | Disposition: A | Discharge status: Home / Self Care | Payer: Medicaid Other | Source: Ambulatory Visit | Attending: Advanced Practice Midwife | Admitting: Advanced Practice Midwife

## 2019-06-12 ENCOUNTER — Encounter: Payer: Self-pay | Admitting: Advanced Practice Midwife

## 2019-06-12 ENCOUNTER — Ambulatory Visit: Payer: Medicaid Other | Admitting: Advanced Practice Midwife

## 2019-06-12 VITALS — BP 126/79 | HR 89 | Temp 98.5°F | Ht 65.0 in | Wt 133.0 lb

## 2019-06-12 DIAGNOSIS — R87612 Low grade squamous intraepithelial lesion on cytologic smear of cervix (LGSIL): Secondary | ICD-10-CM

## 2019-06-12 DIAGNOSIS — Z0000 Encounter for general adult medical examination without abnormal findings: Secondary | ICD-10-CM

## 2019-06-12 DIAGNOSIS — R87610 Atypical squamous cells of undetermined significance on cytologic smear of cervix (ASC-US): Secondary | ICD-10-CM

## 2019-06-12 DIAGNOSIS — B373 Candidiasis of vulva and vagina: Secondary | ICD-10-CM | POA: Diagnosis not present

## 2019-06-12 DIAGNOSIS — Z113 Encounter for screening for infections with a predominantly sexual mode of transmission: Secondary | ICD-10-CM | POA: Diagnosis not present

## 2019-06-12 DIAGNOSIS — R87810 Cervical high risk human papillomavirus (HPV) DNA test positive: Secondary | ICD-10-CM | POA: Insufficient documentation

## 2019-06-12 DIAGNOSIS — F53 Postpartum depression: Secondary | ICD-10-CM | POA: Diagnosis not present

## 2019-06-12 DIAGNOSIS — O99345 Other mental disorders complicating the puerperium: Secondary | ICD-10-CM

## 2019-06-12 DIAGNOSIS — Z01419 Encounter for gynecological examination (general) (routine) without abnormal findings: Secondary | ICD-10-CM | POA: Diagnosis not present

## 2019-06-12 NOTE — Progress Notes (Signed)
Subjective:     Erin French is a 24 y.o. female here at [facility] for a routine exam.  Current complaints: Mood swings.  Personal health questionnaire reviewed: yes.  Do you have a primary care provider? no  Do you feel safe at home? yes Has anyone hit, slapped, or kicked you recently? no    Gynecologic History No LMP recorded (lmp unknown). Patient has had an implant. Contraception: Nexplanon Last Pap: 06/07/2018. Results were: abnormal with ASCUS and positive HPV Last mammogram: n/a.  Obstetric History OB History  Gravida Para Term Preterm AB Living  4 1 1   2 1   SAB TAB Ectopic Multiple Live Births        0 1    # Outcome Date GA Lbr Len/2nd Weight Sex Delivery Anes PTL Lv  4 Gravida           3 Term 04/12/15 [redacted]w[redacted]d / 00:41 6 lb 13.7 oz (3.11 kg) M Vag-Spont None  LIV  2 AB           1 AB              The following portions of the patient's history were reviewed and updated as appropriate: allergies, current medications, past family history, past medical history, past social history, past surgical history and problem list.  Review of Systems Pertinent items noted in HPI and remainder of comprehensive ROS otherwise negative.    Objective:   BP 126/79   Pulse 89   Temp 98.5 F (36.9 C)   Ht 5\' 5"  (1.651 m)   Wt 133 lb (60.3 kg)   LMP  (LMP Unknown)   BMI 22.13 kg/m   VS reviewed, nursing note reviewed,  Constitutional: well developed, well nourished, no distress HEENT: normocephalic, thyroid without enlargement or mass HEART: normal rate, heart sounds, regular rhythm RESP: normal effort, lung sounds clear and equal bilaterally Breast Exam: Deferred with shared decision making Abdomen: soft Neuro: alert and oriented x 3 Skin: warm, dry Psych: affect normal Pelvic exam: Cervix pink, visually closed, without lesion, scant light brown discharge, vaginal walls and external genitalia normal Bimanual exam: Cervix 0/long/high, firm, anterior, neg CMT, uterus  nontender, nonenlarged, adnexa without tenderness, enlargement, or mass     Assessment/Plan:   1. Atypical squamous cell changes of undetermined significance (ASCUS) on cervical cytology with positive high risk human papilloma virus (HPV) --Abnormal pap in 2020, repeat recommended in 1 year per guidelines - Cytology - PAP( [facility])  2. Screening examination for STD (sexually transmitted disease) - Cervicovaginal ancillary only( [facility]) - HIV antibody (with reflex) - RPR - Hepatitis C Antibody - Hepatitis B surface antigen  3. Postpartum depression --Pt reports depressed mood and mood swings since delivery 7 months ago.  Pt also received Nexplanon prior to hospital discharge so thinks this could be contributing. --Discussed options, including leaving Nexplanon in and treated depression, with counseling and medications if needed vs removal of Nexplanon with or without hormonal contraceptives to replace it.  --Pt would like to leave Nexplanon in and work on her depressed feelings with counseling at first. --She denies any thoughts of harming herself or others and reports that she is not depressed every day but has episodes where her mood drops for no reason.  - Ambulatory referral to [facility]     Follow up in: 3 months or as needed.   Fatima Blank, CNM 3:58 PM

## 2019-06-12 NOTE — Patient Instructions (Signed)
Depression Screening Depression screening is a tool that your health care provider can use to learn if you have symptoms of depression. Depression is a common condition with many symptoms that are also often found in other conditions. Depression is treatable, but it must first be diagnosed. You may not know that certain feelings, thoughts, and behaviors that you are having can be symptoms of depression. Taking a depression screening test can help you and your health care provider decide if you need more assessment, or if you should be referred to a mental health care provider. What are the screening tests?  You may have a physical exam to see if another condition is affecting your mental health. You may have a blood or urine sample taken during the physical exam.  You may be interviewed using a screening tool that was developed from research, such as one of these: ? Patient Health Questionnaire (PHQ). This is a set of either 2 or 9 questions. A health care provider who has been trained to score this screening test uses a guide to assess if your symptoms suggest that you may have depression. ? Hamilton Depression Rating Scale (HAM-D). This is a set of either 17 or 24 questions. You may be asked to take it again during or after your treatment, to see if your depression has gotten better. ? Beck Depression Inventory (BDI). This is a set of 21 multiple choice questions. Your health care provider scores your answers to assess:  Your level of depression, ranging from mild to severe.  Your response to treatment.  Your health care provider may talk with you about your daily activities, such as eating, sleeping, work, and recreation, and ask if you have had any changes in activity.  Your health care provider may ask you to see a mental health specialist, such as a psychiatrist or psychologist, for more evaluation. Who should be screened for depression?   All adults, including adults with a family history  of a mental health disorder.  Adolescents who are 12-18 years old.  People who are recovering from a myocardial infarction (MI).  Pregnant women, or women who have given birth.  People who have a long-term (chronic) illness.  Anyone who has been diagnosed with another type of a mental health disorder.  Anyone who has symptoms that could show depression. What do my results mean? Your health care provider will review the results of your depression screening, physical exam, and lab tests. Positive screens suggest that you may have depression. Screening is the first step in getting the care that you may need. It is up to you to get your screening results. Ask your health care provider, or the department that is doing your screening tests, when your results will be ready. Talk with your health care provider about your results and diagnosis. A diagnosis of depression is made using the Diagnostic and Statistical Manual of Mental Disorders (DSM-V). This is a book that lists the number and type of symptoms that must be present for a health care provider to give a specific diagnosis.  Your health care provider may work with you to treat your symptoms of depression, or your health care provider may help you find a mental health provider who can assess, diagnose, and treat your depression. Get help right away if:  You have thoughts about hurting yourself or others. If you ever feel like you may hurt yourself or others, or have thoughts about taking your own life, get help right away. You   can go to your nearest emergency department or call:  Your local emergency services (911 in the [place]  A suicide crisis helpline, such as the National Suicide Prevention Lifeline at 1-800-273-8255. This is open 24 hours a day. Summary  Depression screening is the first step in getting the help that you may need.  If your screening test shows symptoms of depression (is positive), your health care provider may ask  you to see a mental health provider.  Anyone who is age 12 or older should be screened for depression. This information is not intended to replace advice given to you by your health care provider. Make sure you discuss any questions you have with your health care provider. Document Revised: 01/22/2017 Document Reviewed: 06/26/2016 Elsevier Patient Education  2020 Elsevier Inc.  

## 2019-06-12 NOTE — Progress Notes (Signed)
GYN presents for AEX/PAP/STD screening.  Want to know if Nexplanon can cause mood swings.

## 2019-06-13 LAB — HEPATITIS B SURFACE ANTIGEN: Hepatitis B Surface Ag: NEGATIVE

## 2019-06-13 LAB — HIV ANTIBODY (ROUTINE TESTING W REFLEX): HIV Screen 4th Generation wRfx: NONREACTIVE

## 2019-06-13 LAB — HEPATITIS C ANTIBODY: Hep C Virus Ab: <0.1 s/co ratio (ref 0.0–0.9)

## 2019-06-13 LAB — RPR: RPR Ser Ql: NONREACTIVE

## 2019-06-14 LAB — CERVICOVAGINAL ANCILLARY ONLY
Bacterial Vaginitis (gardnerella): NEGATIVE
Candida Glabrata: NEGATIVE
Candida Vaginitis: POSITIVE — AB
Chlamydia: NEGATIVE
Comment: NEGATIVE
Comment: NEGATIVE
Comment: NEGATIVE
Comment: NEGATIVE
Comment: NEGATIVE
Comment: NORMAL
Neisseria Gonorrhea: NEGATIVE
Trichomonas: NEGATIVE

## 2019-06-15 ENCOUNTER — Other Ambulatory Visit: Payer: Self-pay

## 2019-06-15 DIAGNOSIS — B379 Candidiasis, unspecified: Secondary | ICD-10-CM

## 2019-06-15 LAB — CYTOLOGY - PAP
Comment: NEGATIVE
High risk HPV: POSITIVE — AB

## 2019-06-15 MED ORDER — FLUCONAZOLE 150 MG PO TABS: 150.0000 mg | ORAL_TABLET | Freq: Once | ORAL | 0 refills | Status: AC

## 2019-06-17 ENCOUNTER — Encounter: Payer: Self-pay | Admitting: Advanced Practice Midwife

## 2019-06-17 DIAGNOSIS — R87612 Low grade squamous intraepithelial lesion on cytologic smear of cervix (LGSIL): Secondary | ICD-10-CM | POA: Insufficient documentation

## 2019-06-20 ENCOUNTER — Encounter: Payer: Medicaid Other | Admitting: Licensed Clinical Social Worker

## 2019-07-27 ENCOUNTER — Other Ambulatory Visit: Payer: Self-pay

## 2019-07-27 ENCOUNTER — Ambulatory Visit: Payer: Medicaid Other | Admitting: Family Medicine

## 2019-07-27 ENCOUNTER — Encounter: Payer: Self-pay | Admitting: Family Medicine

## 2019-07-27 ENCOUNTER — Encounter: Payer: Self-pay | Admitting: Obstetrics

## 2019-07-27 VITALS — BP 129/86 | HR 111 | Ht 64.5 in | Wt 131.4 lb

## 2019-07-27 DIAGNOSIS — Z3046 Encounter for surveillance of implantable subdermal contraceptive: Secondary | ICD-10-CM | POA: Diagnosis not present

## 2019-07-27 NOTE — Progress Notes (Signed)
Nexplanon Removal Procedure Note  Patient given informed consent for removal of her Nexplanon due to not liking it and not having periods. Time out was performed and signed copy scanning into the chart. Nexplanon site identified on right arm. Area prepped in usual sterile fashon. Three cc of 1% lidocaine was used to anesthetize the area at the distal end of the implant. A small stab incision was made with an 11 blade scalpel at the distal portion end. The Nexplanon rod was grasped using forceps and the tip was brought through the incision. The Nexplanon implant was then grasped with an Alice clamp and was removed without difficulty. Removal of entire 40 mm rod confirmed and shown to patient. There was less than 3 cc blood loss. There were no complications. A pressure bandage was applied to reduce any bruising. The patient tolerated the procedure well and was given post procedure instructions.   She was educated on other forms of birth control, but declined prescription at this time.  Erin French L, DO 07/27/2019 8:53 AM

## 2019-07-27 NOTE — Progress Notes (Signed)
Patient presents for nexplanon removal. Patient states that she does not want to go on another form of birth control at this time. She has no other concerns.

## 2019-07-27 NOTE — Patient Instructions (Signed)
Hormonal Contraception Information Hormonal contraception is a type of birth control that uses hormones to prevent pregnancy. It usually involves a combination of the hormones estrogen and progesterone or only the hormone progesterone. Hormonal contraception works in these ways:  It thickens the mucus in the cervix, making it harder for sperm to enter the uterus.  It changes the lining of the uterus, making it harder for an egg to implant.  It may stop the ovaries from releasing eggs (ovulation). Some women who take hormonal contraceptives that contain only progesterone may continue to ovulate. Hormonal contraception cannot prevent sexually transmitted infections (STIs). Pregnancy may still occur. Estrogen and progesterone contraceptives Contraceptives that use a combination of estrogen and progesterone are available in these forms:  Pill. Pills come in different combinations of hormones. They must be taken at the same time each day. Pills can affect your period, causing you to get your period once every three months or not at all.  Patch. The patch must be worn on the lower abdomen for three weeks and then removed on the fourth.  Vaginal ring. The ring is placed in the vagina and left there for three weeks. It is then removed for one week. Progesterone contraceptives Contraceptives that use progesterone only are available in these forms:  Pill. Pills should be taken every day of the cycle.  Intrauterine device (IUD). This device is inserted into the uterus and removed or replaced every five years or sooner.  Implant. Plastic rods are placed under the skin of the upper arm. They are removed or replaced every three years or sooner.  Injection. The injection is given once every 90 days. What are the side effects? The side effects of estrogen and progesterone contraceptives include:  Nausea.  Headaches.  Breast tenderness.  Bleeding or spotting between menstrual cycles.  High blood  pressure (rare).  Strokes, heart attacks, or blood clots (rare) Side effects of progesterone-only contraceptives include:  Nausea.  Headaches.  Breast tenderness.  Unpredictable menstrual bleeding.  High blood pressure (rare). Talk to your health care provider about what side effects may affect you. Where to find more information  Ask your health care provider for more information and resources about hormonal contraception.  [place] Department of Health and Human Services Office on Women's Health: www.womenshealth.gov Questions to ask:  What type of hormonal contraception is right for me?  How long should I plan to use hormonal contraception?  What are the side effects of the hormonal contraception method I choose?  How can I prevent STIs while using hormonal contraception? Contact a health care provider if:  You start taking hormonal contraceptives and you develop persistent or severe side effects. Summary  Estrogen and progesterone are hormones used in many forms of birth control.  Talk to your health care provider about what side effects may affect you.  Hormonal contraception cannot prevent sexually transmitted infections (STIs).  Ask your health care provider for more information and resources about hormonal contraception. This information is not intended to replace advice given to you by your health care provider. Make sure you discuss any questions you have with your health care provider. Document Revised: 06/06/2018 Document Reviewed: 01/10/2016 Elsevier Patient Education  2020 Elsevier Inc.  

## 2019-08-03 ENCOUNTER — Ambulatory Visit (HOSPITAL_BASED_OUTPATIENT_CLINIC_OR_DEPARTMENT_OTHER): Payer: Medicaid Other

## 2019-08-03 ENCOUNTER — Other Ambulatory Visit: Payer: Self-pay

## 2019-08-03 ENCOUNTER — Other Ambulatory Visit (HOSPITAL_COMMUNITY)
Admission: RE | Admit: 2019-08-03 | Discharge: 2019-08-03 | Disposition: A | Discharge status: Home / Self Care | Payer: Medicaid Other | Source: Ambulatory Visit | Attending: Obstetrics & Gynecology | Admitting: Obstetrics & Gynecology

## 2019-08-03 DIAGNOSIS — Z113 Encounter for screening for infections with a predominantly sexual mode of transmission: Secondary | ICD-10-CM | POA: Diagnosis not present

## 2019-08-03 DIAGNOSIS — N898 Other specified noninflammatory disorders of vagina: Secondary | ICD-10-CM | POA: Diagnosis not present

## 2019-08-03 NOTE — Progress Notes (Signed)
Erin French is here for STD screening.  Pt reports having yellow runny discharge.  She is not sure for how long.  Results pending. -EH/RMA

## 2019-08-04 ENCOUNTER — Other Ambulatory Visit (INDEPENDENT_AMBULATORY_CARE_PROVIDER_SITE_OTHER): Payer: Self-pay

## 2019-08-04 LAB — CERVICOVAGINAL ANCILLARY ONLY
Bacterial Vaginitis (gardnerella): POSITIVE — AB
Candida Glabrata: NEGATIVE
Candida Vaginitis: NEGATIVE
Chlamydia: NEGATIVE
Comment: NEGATIVE
Comment: NEGATIVE
Comment: NEGATIVE
Comment: NEGATIVE
Comment: NEGATIVE
Comment: NORMAL
Neisseria Gonorrhea: NEGATIVE
Trichomonas: NEGATIVE

## 2019-08-07 ENCOUNTER — Other Ambulatory Visit: Payer: Self-pay

## 2019-08-07 ENCOUNTER — Other Ambulatory Visit: Payer: Self-pay | Admitting: Obstetrics & Gynecology

## 2019-08-07 ENCOUNTER — Other Ambulatory Visit (INDEPENDENT_AMBULATORY_CARE_PROVIDER_SITE_OTHER): Payer: Self-pay

## 2019-08-07 ENCOUNTER — Telehealth (INDEPENDENT_AMBULATORY_CARE_PROVIDER_SITE_OTHER): Payer: Medicaid Other | Admitting: Lactation Services

## 2019-08-07 DIAGNOSIS — N760 Acute vaginitis: Secondary | ICD-10-CM

## 2019-08-07 DIAGNOSIS — B9689 Other specified bacterial agents as the cause of diseases classified elsewhere: Secondary | ICD-10-CM

## 2019-08-07 MED ORDER — METRONIDAZOLE 500 MG PO TABS: 500.0000 mg | ORAL_TABLET | Freq: Two times a day (BID) | ORAL | 0 refills | Status: AC

## 2019-08-07 MED ORDER — METRONIDAZOLE 500 MG PO TABS: 500.0000 mg | ORAL_TABLET | Freq: Two times a day (BID) | ORAL | 0 refills | Status: DC

## 2019-08-07 NOTE — Telephone Encounter (Signed)
-----   Message from Cherre Blanc, MD sent at 08/07/2019 10:10 AM EDT ----- BV noted, flagyl sent to pharmacy

## 2019-08-07 NOTE — Telephone Encounter (Signed)
Called patient to inform her of results and prescription. Patient did not answer. LM for patient to call the office for results at her convenience.   My chart message sent.

## 2019-08-13 ENCOUNTER — Other Ambulatory Visit: Payer: Self-pay

## 2019-08-13 ENCOUNTER — Emergency Department (HOSPITAL_COMMUNITY)
Admission: EM | Admit: 2019-08-13 | Discharge: 2019-08-14 | Disposition: A | Discharge status: Home / Self Care | Payer: Medicaid Other | Attending: Emergency Medicine | Admitting: Emergency Medicine

## 2019-08-13 ENCOUNTER — Encounter (HOSPITAL_COMMUNITY): Payer: Self-pay | Admitting: Emergency Medicine

## 2019-08-13 DIAGNOSIS — L0231 Cutaneous abscess of buttock: Secondary | ICD-10-CM | POA: Diagnosis not present

## 2019-08-13 MED ORDER — DOXYCYCLINE HYCLATE 100 MG PO CAPS: 100.0000 mg | ORAL_CAPSULE | Freq: Two times a day (BID) | ORAL | 0 refills | Status: AC

## 2019-08-13 NOTE — ED Triage Notes (Addendum)
C/o abscess to L buttocks x 1 week.  States this is the 3rd abscess to buttocks in the last month.  Reports chills.

## 2019-08-13 NOTE — Discharge Instructions (Addendum)
You have been seen for an abscess.  I have placed you on antibiotics please take as prescribed.  This antibiotic can make you more susceptible to sunburn, please remember to wear sunscreen and a hat to protect your skin while on this medication.  I want you to apply warm compresses to the area and gently massage the area to promote drainage.  I want you to follow-up with your primary care doctor in 1 week for wound reevaluation.  I want to come back to emergency department if you develop fever, chills, shortness of breath, chest pain, worsening buttocks pain, increasing size of abscess or redness around the area as these symptoms require further evaluation.

## 2019-08-13 NOTE — ED Provider Notes (Signed)
[facility] EMERGENCY DEPARTMENT Provider Note   CSN: [id number] Arrival date & time: 08/13/19  1806     History Chief Complaint  Patient presents with  . Abscess    Erin French is a 24 y.o. female.  HPI   Patient presents emergency department chief complaint of abscess on her gluteus maximus.  She states she first noticed these about 3 weeks ago.  Generally they would pop and go away but this one has stayed and became more painful. She has seen some drainage from the area and is very tender to the touch.  She admits that she has felt some chills but denies having fevers.  Patient denies headache, fever, sore throat, chest pain, shortness of breath, nausea, vomiting, abdominal pain urinary symptoms, difficulty passing stools.  Patient denies any significant medical history, does not take any medication on a daily basis.  Past Medical History:  Diagnosis Date  . Chlamydia 4/12    Patient Active Problem List   Diagnosis Date Noted  . 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 Fibroid uterus 11/21/2018  . S/P primary low transverse C-section 11/01/2018  . Malpresentation before onset of labor 11/01/2018    Past Surgical History:  Procedure Laterality Date  . CESAREAN SECTION N/A 11/17/2018   Procedure: CESAREAN SECTION;  Surgeon: Aletha Halim, MD;  Location: [facility] LD ORS;  Service: Obstetrics;  Laterality: N/A;  . INDUCED ABORTION       OB History    Gravida  4   Para  2   Term  2   Preterm      AB  2   Living  2     SAB      TAB      Ectopic      Multiple  0   Live Births  2           Family History  Problem Relation Age of Onset  . Hypertension Father   . Schizophrenia Mother   . Hypertension Maternal Grandmother     Social History   Tobacco Use  . Smoking status: Never Smoker  . Smokeless tobacco: Never Used  Vaping Use  . Vaping Use: Never used  Substance Use Topics  . Alcohol use: Not Currently     Alcohol/week: 0.0 standard drinks    Comment: Last drink December 2019  . Drug use: No    Home Medications Prior to Admission medications   Medication Sig Start Date End Date Taking? Authorizing Provider  doxycycline (VIBRAMYCIN) 100 MG capsule Take 1 capsule (100 mg total) by mouth 2 (two) times daily for 8 days. 08/13/19 08/21/19  Marcello Fennel, PA-C  ferrous sulfate 325 (65 FE) MG tablet Take 1 tablet (325 mg total) by mouth 2 (two) times daily with a meal. Patient not taking: Reported on 12/06/2018 11/19/18 12/19/18  Lattie Haw, MD  metroNIDAZOLE (FLAGYL) 500 MG tablet Take 1 tablet (500 mg total) by mouth 2 (two) times daily for 7 days. 08/07/19 08/14/19  Cherre Blanc, MD    Allergies    Patient has no known allergies.  Review of Systems   Review of Systems  Constitutional: Negative for chills and fever.  HENT: Negative for congestion, sneezing and sore throat.   Eyes: Negative for pain.  Respiratory: Negative for cough and shortness of breath.   Cardiovascular: Negative for chest pain and leg swelling.  Gastrointestinal: Negative for abdominal pain, diarrhea, nausea and vomiting.  Genitourinary: Negative for dysuria,  enuresis, flank pain and vaginal bleeding.  Musculoskeletal: Negative for back pain.  Skin: Positive for wound. Negative for rash.       Patient admits to having abscesses on her right buttocks  Neurological: Negative for dizziness and headaches.  Hematological: Does not bruise/bleed easily.    Physical Exam Updated Vital Signs BP 132/86 (BP Location: Right Arm)   Pulse 84   Temp 99.8 F (37.7 C) (Oral)   Resp 15   Ht 5\' 5"  (1.651 m)   Wt 59.9 kg   SpO2 99%   BMI 21.97 kg/m   Physical Exam Vitals and nursing note reviewed.  Constitutional:      General: She is not in acute distress.    Appearance: She is not ill-appearing.  HENT:     Head: Normocephalic and atraumatic.     Nose: No congestion.     Mouth/Throat:     Mouth: Mucous  membranes are moist.     Pharynx: Oropharynx is clear.  Eyes:     General: No scleral icterus. Cardiovascular:     Rate and Rhythm: Normal rate and regular rhythm.     Pulses: Normal pulses.     Heart sounds: No murmur heard.  No friction rub. No gallop.   Pulmonary:     Effort: No respiratory distress.     Breath sounds: No wheezing, rhonchi or rales.  Abdominal:     General: There is no distension.     Tenderness: There is no abdominal tenderness. There is no guarding.  Musculoskeletal:        General: No swelling.  Skin:    General: Skin is warm and dry.     Findings: No rash.     Comments: Patient had an abscess on her right gluteus maximus.  There was no erythema noted around it, there was purulent discharge and induration note. no fluctuant felt.  Was not warm to the touch.  Neurological:     Mental Status: She is alert.  Psychiatric:        Mood and Affect: Mood normal.     ED Results / Procedures / Treatments   Labs (all labs ordered are listed, but only abnormal results are displayed) Labs Reviewed - No data to display  EKG None  Radiology No results found.  Procedures Procedures (including critical care time)  Medications Ordered in ED Medications - No data to display  ED Course  I have reviewed the triage vital signs and the nursing notes.  Pertinent labs & imaging results that were available during my care of the patient were reviewed by me and considered in my medical decision making (see chart for details).    MDM Rules/Calculators/A&P                          I have personally reviewed all imaging, labs and have interpreted them.  Due to patient's history most concern for cellulitis versus abscess.  Due to physical exam unlikely the patient has cellulitis as area is nonerythematous, not warm to the touch, patient is afebrile, nontachycardic, nontachypneic.  Likely patient has an abscess that has spontaneously drained, risk outweighs benefit for a  I&D as it will increase more pain with minimal return, no fluctuance felt.  Abscess is currently draining without difficulty.  Will place patient on antibiotics and have patient follow-up with primary care.  Labs were not warranted as patient was stable, vitals were reassuring no signs of systemic infection.  Patient appears to be resting comfortably in bed, show no signs of distress.  Patient vitals have remained stable, she does not meet criteria to be admitted to the hospital.  Likely that patient is suffering from a abscess which has spontaneously drained, will place patient on antibiotics, given at home instructions as well as strict return precautions.  Patient was discussed with attending who agrees with assessment and plan.  Patient was explained the results and plan, she verbalized that she understood and agrees to plan.   Final Clinical Impression(s) / ED Diagnoses Final diagnoses:  Abscess of buttock, right    Rx / DC Orders ED Discharge Orders         Ordered    doxycycline (VIBRAMYCIN) 100 MG capsule  2 times daily     Discontinue  Reprint     08/13/19 2205           Marcello Fennel, PA-C 08/13/19 2308    Valarie Merino, MD 08/17/19 1126

## 2020-02-14 ENCOUNTER — Other Ambulatory Visit (HOSPITAL_COMMUNITY)
Admission: RE | Admit: 2020-02-14 | Discharge: 2020-02-14 | Disposition: A | Discharge status: Home / Self Care | Payer: Medicaid Other | Source: Ambulatory Visit | Attending: Obstetrics | Admitting: Obstetrics

## 2020-02-14 ENCOUNTER — Ambulatory Visit (INDEPENDENT_AMBULATORY_CARE_PROVIDER_SITE_OTHER): Payer: Medicaid Other

## 2020-02-14 ENCOUNTER — Other Ambulatory Visit: Payer: Self-pay

## 2020-02-14 VITALS — BP 143/93 | HR 74

## 2020-02-14 DIAGNOSIS — Z113 Encounter for screening for infections with a predominantly sexual mode of transmission: Secondary | ICD-10-CM | POA: Diagnosis not present

## 2020-02-14 NOTE — Progress Notes (Signed)
Patient was assessed and managed by nursing staff during this encounter. I have reviewed the chart and agree with the documentation and plan. I have also made any necessary editorial changes.  Griffin Basil, MD 02/14/2020 1:25 PM

## 2020-02-14 NOTE — Progress Notes (Signed)
Patient presents for STD testing. Patient denies having any vaginal discharge, odor, or irritation. Patient states that she just wants to be checked. Patient has no other concerns.  Patient advised that results may take 24-48 hours to result and treatment will be determined at that time if needed.  Patient also advised to monitor her blood pressures at home due to elevated readings in office and follow up with PCP for continued elevated readings.  Patient verbalized understanding.

## 2020-02-15 ENCOUNTER — Other Ambulatory Visit: Payer: Self-pay

## 2020-02-15 DIAGNOSIS — N760 Acute vaginitis: Secondary | ICD-10-CM

## 2020-02-15 LAB — CERVICOVAGINAL ANCILLARY ONLY
Bacterial Vaginitis (gardnerella): POSITIVE — AB
Candida Glabrata: NEGATIVE
Candida Vaginitis: NEGATIVE
Chlamydia: NEGATIVE
Comment: NEGATIVE
Comment: NEGATIVE
Comment: NEGATIVE
Comment: NEGATIVE
Comment: NEGATIVE
Comment: NORMAL
Neisseria Gonorrhea: NEGATIVE
Trichomonas: NEGATIVE

## 2020-02-15 MED ORDER — METRONIDAZOLE 500 MG PO TABS: 500.0000 mg | ORAL_TABLET | Freq: Two times a day (BID) | ORAL | 0 refills | Status: DC

## 2020-09-30 ENCOUNTER — Ambulatory Visit: Payer: Medicaid Other | Admitting: Advanced Practice Midwife

## 2021-02-15 ENCOUNTER — Other Ambulatory Visit: Payer: Self-pay

## 2021-02-15 ENCOUNTER — Emergency Department (HOSPITAL_COMMUNITY)
Admission: EM | Admit: 2021-02-15 | Discharge: 2021-02-16 | Disposition: A | Discharge status: Home / Self Care | Payer: Medicaid Other | Attending: Emergency Medicine | Admitting: Emergency Medicine

## 2021-02-15 DIAGNOSIS — R509 Fever, unspecified: Secondary | ICD-10-CM | POA: Diagnosis present

## 2021-02-15 DIAGNOSIS — Z20822 Contact with and (suspected) exposure to covid-19: Secondary | ICD-10-CM | POA: Diagnosis not present

## 2021-02-15 DIAGNOSIS — B349 Viral infection, unspecified: Secondary | ICD-10-CM

## 2021-02-15 DIAGNOSIS — M791 Myalgia, unspecified site: Secondary | ICD-10-CM | POA: Diagnosis not present

## 2021-02-15 MED ORDER — ACETAMINOPHEN 500 MG PO TABS
1000.0000 mg | ORAL_TABLET | Freq: Once | ORAL | Status: AC
  Administered 2021-02-15: 1000 mg via ORAL
  Filled 2021-02-15: qty 2

## 2021-02-15 MED ORDER — ONDANSETRON 4 MG PO TBDP
4.0000 mg | ORAL_TABLET | Freq: Once | ORAL | Status: AC
  Administered 2021-02-15: 4 mg via ORAL
  Filled 2021-02-15: qty 1

## 2021-02-15 NOTE — ED Provider Notes (Addendum)
Emergency Medicine Provider Triage Evaluation Note  Erin French , a 25 y.o. female  was evaluated in triage.  Pt complains of generalized body aches.  Patient states that for 1 week she has had generalized body aches particularly in her back.  She also reports chills.  She states that she has had some pain in her womb."  She endorses nausea with dry heaving, diarrhea and decreased p.o. intake.  She denies shortness of breath.  Asked her describe "pain in her womb" she states that the pain is not in her upper abdomen is just in her lower abdomen.  She also endorses fever up to 103.  Endorses cough as of today.  Denies sick contacts.  Review of Systems  Positive: See above Negative:   Physical Exam  BP 131/73 (BP Location: Right Arm)    Pulse (!) 121    Temp (!) 103.3 F (39.6 C) (Oral)    Resp 17    Ht 5' 4.5" (1.638 m)    Wt 60.8 kg    SpO2 97%    BMI 22.65 kg/m  Gen:   Awake, no distress   Resp:  Normal effort, lung sounds clear to auscultation bilaterally MSK:   Moves extremities without difficulty  Other:  S1/S2 with tachycardia.  Abdomen is flat, soft, and nontender.  Positive bowel sounds.  Medical Decision Making  Medically screening exam initiated at 11:23 PM.  Appropriate orders placed.  Elyza Whitt was informed that the remainder of the evaluation will be completed by another provider, this initial triage assessment does not replace that evaluation, and the importance of remaining in the ED until their evaluation is complete.     Mickie Hillier, PA-C 02/15/21 2325    Mickie Hillier, PA-C 02/15/21 Linward Foster    Ezequiel Essex, MD 02/16/21 [phone]

## 2021-02-15 NOTE — ED Triage Notes (Signed)
Pt reported to ED with c/o generalized body aches, fever, chills and cough x4-5 days. Pt reports using OTC medication occasionally with no relief in symptoms. Also describes "womb" pain, denies any urinary symptoms and LMP was 02/02/2021.

## 2021-02-16 LAB — URINALYSIS, ROUTINE W REFLEX MICROSCOPIC
Bilirubin Urine: NEGATIVE
Glucose, UA: NEGATIVE mg/dL
Ketones, ur: 15 mg/dL — AB
Nitrite: NEGATIVE
Protein, ur: NEGATIVE mg/dL
Specific Gravity, Urine: <1.005 — ABNORMAL LOW (ref 1.005–1.030)
pH: 6 (ref 5.0–8.0)

## 2021-02-16 LAB — URINALYSIS, MICROSCOPIC (REFLEX)

## 2021-02-16 LAB — RESP PANEL BY RT-PCR (FLU A&B, COVID) ARPGX2
Influenza A by PCR: NEGATIVE
Influenza B by PCR: NEGATIVE
SARS Coronavirus 2 by RT PCR: NEGATIVE

## 2021-02-16 LAB — PREGNANCY, URINE: Preg Test, Ur: NEGATIVE

## 2021-02-16 MED ORDER — IBUPROFEN 400 MG PO TABS
600.0000 mg | ORAL_TABLET | Freq: Once | ORAL | Status: AC
  Administered 2021-02-16: 03:00:00 600 mg via ORAL
  Filled 2021-02-16: qty 1

## 2021-02-16 NOTE — Discharge Instructions (Addendum)
We suspect your symptoms are due to a viral illness. We advise 600mg  ibuprofen every 6 hours for fever and body aches. You may alternate this with 650mg  Tylenol every 6 hours, if desired. Drink plenty of fluids to prevent dehydration. Follow-up with a primary care doctor in the next 24-48 hours for recheck. You may return for new or concerning symptoms.

## 2021-02-16 NOTE — ED Provider Notes (Signed)
[facility] EMERGENCY DEPARTMENT Provider Note   CSN: [id number] Arrival date & time: 02/15/21  2237     History Chief Complaint  Patient presents with   Generalized Body Aches    Erin French is a 25 y.o. female.  25 year old female presents to the emergency department for evaluation of febrile illness.  Reports subjective, tactile fever for 3 days.  Highest documented temperature 103 F.  She first noted generalized body aches 1 week ago.  This was particularly localized to her back.  She also notes some discomfort in her lower abdomen as well as a cough.  She has been using an over-the-counter cough medication without symptomatic improvement.  Further endorsing some nausea, diarrhea, anorexia.  Denies any known sick contacts.  No urinary symptoms, vaginal bleeding, vaginal discharge.  Is sexually active with 1 female partner and denies present concern for STDs.  The history is provided by the patient. No language interpreter was used.      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   Fibroid uterus 11/21/2018   S/P primary low transverse C-section 11/01/2018   Malpresentation before onset of labor 11/01/2018    Past Surgical History:  Procedure Laterality Date   CESAREAN SECTION N/A 11/17/2018   Procedure: CESAREAN SECTION;  Surgeon: Aletha Halim, MD;  Location: [facility] LD ORS;  Service: Obstetrics;  Laterality: N/A;   INDUCED ABORTION       OB History     Gravida  4   Para  2   Term  2   Preterm      AB  2   Living  2      SAB      IAB      Ectopic      Multiple  0   Live Births  2           Family History  Problem Relation Age of Onset   Hypertension Father    Schizophrenia Mother    Hypertension Maternal Grandmother     Social History   Tobacco Use   Smoking status: Never   Smokeless tobacco: Never   Vaping Use   Vaping Use: Never used  Substance Use Topics   Alcohol use: Not Currently    Alcohol/week: 0.0 standard drinks    Comment: Last drink December 2019   Drug use: No    Home Medications Prior to Admission medications   Medication Sig Start Date End Date Taking? Authorizing Provider  ferrous sulfate 325 (65 FE) MG tablet Take 1 tablet (325 mg total) by mouth 2 (two) times daily with a meal. Patient not taking: Reported on 12/06/2018 11/19/18 12/19/18  Lattie Haw, MD  metroNIDAZOLE (FLAGYL) 500 MG tablet Take 1 tablet (500 mg total) by mouth 2 (two) times daily. 02/15/20   Griffin Basil, MD    Allergies    Patient has no known allergies.  Review of Systems   Review of Systems  Constitutional:  Positive for fatigue and fever.  Respiratory:  Positive for cough. Negative for shortness of breath.   Gastrointestinal:  Positive for diarrhea and nausea. Negative for vomiting.  Genitourinary:  Negative for dysuria.  Musculoskeletal:  Positive for myalgias.  Ten systems reviewed and are negative for acute change, except as noted in the HPI.    Physical Exam Updated Vital Signs BP 119/81  Pulse 99    Temp (!) 101.2 F (38.4 C) (Oral)    Resp 19    Ht 5' 4.5" (1.638 m)    Wt 60.8 kg    SpO2 100%    BMI 22.65 kg/m   Physical Exam Vitals and nursing note reviewed.  Constitutional:      General: She is not in acute distress.    Appearance: She is well-developed. She is not diaphoretic.     Comments: Nontoxic appearing and in NAD  HENT:     Head: Normocephalic and atraumatic.  Eyes:     General: No scleral icterus.    Conjunctiva/sclera: Conjunctivae normal.  Cardiovascular:     Rate and Rhythm: Normal rate and regular rhythm.     Pulses: Normal pulses.     Comments: Not tachycardic as noted in triage Pulmonary:     Effort: Pulmonary effort is normal. No respiratory distress.  Abdominal:     Palpations: Abdomen is soft. There is no mass.     Tenderness: There  is no abdominal tenderness. There is no guarding.     Comments: Soft, nondistended, nontender abdomen.  Musculoskeletal:        General: Normal range of motion.     Cervical back: Normal range of motion.  Skin:    General: Skin is warm and dry.     Coloration: Skin is not pale.     Findings: No erythema or rash.  Neurological:     Mental Status: She is alert and oriented to person, place, and time.     Coordination: Coordination normal.     Comments: Ambulatory with steady gait.  Psychiatric:        Behavior: Behavior normal.    ED Results / Procedures / Treatments   Labs (all labs ordered are listed, but only abnormal results are displayed) Labs Reviewed  URINALYSIS, ROUTINE W REFLEX MICROSCOPIC - Abnormal; Notable for the following components:      Result Value   Specific Gravity, Urine <1.005 (*)    Hgb urine dipstick TRACE (*)    Ketones, ur 15 (*)    Leukocytes,Ua TRACE (*)    All other components within normal limits  URINALYSIS, MICROSCOPIC (REFLEX) - Abnormal; Notable for the following components:   Bacteria, UA RARE (*)    All other components within normal limits  RESP PANEL BY RT-PCR (FLU A&B, COVID) ARPGX2  PREGNANCY, URINE    EKG None  Radiology No results found.  Procedures Procedures   Medications Ordered in ED Medications  ibuprofen (ADVIL) tablet 600 mg (has no administration in time range)  ondansetron (ZOFRAN-ODT) disintegrating tablet 4 mg (4 mg Oral Given 02/15/21 2342)  acetaminophen (TYLENOL) tablet 1,000 mg (1,000 mg Oral Given 02/15/21 2342)    ED Course  I have reviewed the triage vital signs and the nursing notes.  Pertinent labs & imaging results that were available during my care of the patient were reviewed by me and considered in my medical decision making (see chart for details).    MDM Rules/Calculators/A&P                          Patient presents to the emergency department for flu-like illness, fever. Fever is tactile and  responding appropriately to antipyretics. Patient is alert and nontoxic. No nuchal rigidity or meningismus to suggest meningitis. Lungs clear to auscultation. No tachypnea, dyspnea, or hypoxia. Doubt pneumonia. Abdomen soft, nontender. UA negative for UTI. Pregnancy is  negative. Also negative for influenza and COVID in the ED today.  Suspect viral etiology of symptoms. Given response to antipyretics, tolerating NPO in ED, nontoxic appearing nature of patient, have recommended PCP follow-up within the next 24-48 hours for recheck. Will continue with Tylenol and ibuprofen for fever management. Return precautions discussed and provided. Patient discharged in stable condition with no unaddressed concerns.     Final Clinical Impression(s) / ED Diagnoses Final diagnoses:  Viral syndrome    Rx / DC Orders ED Discharge Orders     None        Antonietta Breach, PA-C 02/16/21 0310    Quintella Reichert, MD 02/17/21 [phone]

## 2021-02-18 ENCOUNTER — Telehealth: Payer: Self-pay

## 2021-02-18 NOTE — Telephone Encounter (Signed)
Transition Care Management Unsuccessful Follow-up Telephone Call  Date of discharge and from where:  02/16/2021 from [facility]  Attempts:  1st Attempt  Reason for unsuccessful TCM follow-up call:  Unable to leave message

## 2021-02-20 NOTE — Telephone Encounter (Signed)
Transition Care Management Unsuccessful Follow-up Telephone Call  Date of discharge and from where:  02/16/2021 from [facility]  Attempts:  2nd Attempt  Reason for unsuccessful TCM follow-up call:  Unable to leave message

## 2021-02-21 NOTE — Telephone Encounter (Signed)
Transition Care Management Unsuccessful Follow-up Telephone Call  Date of discharge and from where:  02/16/2021 from [facility]  Attempts:  3rd Attempt  Reason for unsuccessful TCM follow-up call:  Unable to reach patient

## 2021-02-22 ENCOUNTER — Encounter (HOSPITAL_COMMUNITY): Payer: Self-pay

## 2021-02-22 ENCOUNTER — Emergency Department (HOSPITAL_COMMUNITY): Payer: Medicaid Other

## 2021-02-22 ENCOUNTER — Other Ambulatory Visit: Payer: Self-pay

## 2021-02-22 ENCOUNTER — Inpatient Hospital Stay (HOSPITAL_COMMUNITY)
Admission: EM | Admit: 2021-02-22 | Discharge: 2021-03-04 | DRG: 871 | Disposition: A | Discharge status: Home / Self Care | Payer: Medicaid Other | Attending: Internal Medicine | Admitting: Internal Medicine

## 2021-02-22 DIAGNOSIS — R6520 Severe sepsis without septic shock: Secondary | ICD-10-CM | POA: Diagnosis not present

## 2021-02-22 DIAGNOSIS — E860 Dehydration: Secondary | ICD-10-CM | POA: Diagnosis not present

## 2021-02-22 DIAGNOSIS — M3500 Sicca syndrome, unspecified: Secondary | ICD-10-CM | POA: Diagnosis not present

## 2021-02-22 DIAGNOSIS — G9341 Metabolic encephalopathy: Secondary | ICD-10-CM | POA: Diagnosis not present

## 2021-02-22 DIAGNOSIS — I10 Essential (primary) hypertension: Secondary | ICD-10-CM | POA: Diagnosis not present

## 2021-02-22 DIAGNOSIS — E861 Hypovolemia: Secondary | ICD-10-CM | POA: Diagnosis not present

## 2021-02-22 DIAGNOSIS — M6282 Rhabdomyolysis: Secondary | ICD-10-CM | POA: Diagnosis not present

## 2021-02-22 DIAGNOSIS — R531 Weakness: Secondary | ICD-10-CM

## 2021-02-22 DIAGNOSIS — R590 Localized enlarged lymph nodes: Secondary | ICD-10-CM | POA: Diagnosis present

## 2021-02-22 DIAGNOSIS — I951 Orthostatic hypotension: Secondary | ICD-10-CM | POA: Diagnosis not present

## 2021-02-22 DIAGNOSIS — N179 Acute kidney failure, unspecified: Secondary | ICD-10-CM | POA: Diagnosis not present

## 2021-02-22 DIAGNOSIS — R000 Tachycardia, unspecified: Secondary | ICD-10-CM | POA: Diagnosis not present

## 2021-02-22 DIAGNOSIS — R7401 Elevation of levels of liver transaminase levels: Secondary | ICD-10-CM | POA: Diagnosis present

## 2021-02-22 DIAGNOSIS — R0902 Hypoxemia: Secondary | ICD-10-CM | POA: Diagnosis not present

## 2021-02-22 DIAGNOSIS — N1330 Unspecified hydronephrosis: Secondary | ICD-10-CM | POA: Diagnosis not present

## 2021-02-22 DIAGNOSIS — R4781 Slurred speech: Secondary | ICD-10-CM | POA: Diagnosis not present

## 2021-02-22 DIAGNOSIS — A419 Sepsis, unspecified organism: Secondary | ICD-10-CM | POA: Diagnosis not present

## 2021-02-22 DIAGNOSIS — D259 Leiomyoma of uterus, unspecified: Secondary | ICD-10-CM | POA: Diagnosis not present

## 2021-02-22 DIAGNOSIS — G9340 Encephalopathy, unspecified: Secondary | ICD-10-CM | POA: Diagnosis present

## 2021-02-22 DIAGNOSIS — I3139 Other pericardial effusion (noninflammatory): Secondary | ICD-10-CM | POA: Diagnosis present

## 2021-02-22 DIAGNOSIS — G0490 Encephalitis and encephalomyelitis, unspecified: Secondary | ICD-10-CM | POA: Diagnosis present

## 2021-02-22 DIAGNOSIS — R4182 Altered mental status, unspecified: Secondary | ICD-10-CM | POA: Diagnosis not present

## 2021-02-22 DIAGNOSIS — E2740 Unspecified adrenocortical insufficiency: Secondary | ICD-10-CM | POA: Diagnosis not present

## 2021-02-22 DIAGNOSIS — E871 Hypo-osmolality and hyponatremia: Secondary | ICD-10-CM | POA: Diagnosis not present

## 2021-02-22 DIAGNOSIS — Z20822 Contact with and (suspected) exposure to covid-19: Secondary | ICD-10-CM | POA: Diagnosis present

## 2021-02-22 DIAGNOSIS — E875 Hyperkalemia: Secondary | ICD-10-CM | POA: Diagnosis not present

## 2021-02-22 DIAGNOSIS — N132 Hydronephrosis with renal and ureteral calculous obstruction: Secondary | ICD-10-CM | POA: Diagnosis not present

## 2021-02-22 DIAGNOSIS — A879 Viral meningitis, unspecified: Secondary | ICD-10-CM | POA: Diagnosis present

## 2021-02-22 DIAGNOSIS — D509 Iron deficiency anemia, unspecified: Secondary | ICD-10-CM | POA: Diagnosis not present

## 2021-02-22 DIAGNOSIS — R0682 Tachypnea, not elsewhere classified: Secondary | ICD-10-CM | POA: Diagnosis not present

## 2021-02-22 DIAGNOSIS — R404 Transient alteration of awareness: Secondary | ICD-10-CM | POA: Diagnosis not present

## 2021-02-22 LAB — URINALYSIS, ROUTINE W REFLEX MICROSCOPIC
Bilirubin Urine: NEGATIVE
Glucose, UA: NEGATIVE mg/dL
Ketones, ur: 20 mg/dL — AB
Nitrite: NEGATIVE
Protein, ur: 100 mg/dL — AB
Specific Gravity, Urine: 1.024 (ref 1.005–1.030)
pH: 6 (ref 5.0–8.0)

## 2021-02-22 LAB — RAPID URINE DRUG SCREEN, HOSP PERFORMED
Amphetamines: NOT DETECTED
Barbiturates: NOT DETECTED
Benzodiazepines: NOT DETECTED
Cocaine: NOT DETECTED
Opiates: NOT DETECTED
Tetrahydrocannabinol: POSITIVE — AB

## 2021-02-22 LAB — I-STAT VENOUS BLOOD GAS, ED
Acid-base deficit: 2 mmol/L (ref 0.0–2.0)
Bicarbonate: 21.5 mmol/L (ref 20.0–28.0)
Calcium, Ion: 1.04 mmol/L — ABNORMAL LOW (ref 1.15–1.40)
HCT: 36 % (ref 36.0–46.0)
Hemoglobin: 12.2 g/dL (ref 12.0–15.0)
O2 Saturation: 86 %
Potassium: 4.5 mmol/L (ref 3.5–5.1)
Sodium: 130 mmol/L — ABNORMAL LOW (ref 135–145)
TCO2: 22 mmol/L (ref 22–32)
pCO2, Ven: 33 mmHg — ABNORMAL LOW (ref 44.0–60.0)
pH, Ven: 7.421 (ref 7.250–7.430)
pO2, Ven: 50 mmHg — ABNORMAL HIGH (ref 32.0–45.0)

## 2021-02-22 LAB — CBC WITH DIFFERENTIAL/PLATELET
Abs Immature Granulocytes: 0 10*3/uL (ref 0.00–0.07)
Basophils Absolute: 0 10*3/uL (ref 0.0–0.1)
Basophils Relative: 1 %
Eosinophils Absolute: 0 10*3/uL (ref 0.0–0.5)
Eosinophils Relative: 0 %
HCT: 33.2 % — ABNORMAL LOW (ref 36.0–46.0)
Hemoglobin: 11.2 g/dL — ABNORMAL LOW (ref 12.0–15.0)
Lymphocytes Relative: 12 %
Lymphs Abs: 0.6 10*3/uL — ABNORMAL LOW (ref 0.7–4.0)
MCH: 26.2 pg (ref 26.0–34.0)
MCHC: 33.7 g/dL (ref 30.0–36.0)
MCV: 77.6 fL — ABNORMAL LOW (ref 80.0–100.0)
Monocytes Absolute: 0 10*3/uL — ABNORMAL LOW (ref 0.1–1.0)
Monocytes Relative: 0 %
Neutro Abs: 4.3 10*3/uL (ref 1.7–7.7)
Neutrophils Relative %: 87 %
Platelets: 154 10*3/uL (ref 150–400)
RBC: 4.28 MIL/uL (ref 3.87–5.11)
RDW: 14 % (ref 11.5–15.5)
WBC: 4.9 10*3/uL (ref 4.0–10.5)
nRBC: 0 % (ref 0.0–0.2)
nRBC: 0 /100 WBC

## 2021-02-22 LAB — COMPREHENSIVE METABOLIC PANEL
ALT: 21 U/L (ref 0–44)
AST: 126 U/L — ABNORMAL HIGH (ref 15–41)
Albumin: 3.4 g/dL — ABNORMAL LOW (ref 3.5–5.0)
Alkaline Phosphatase: 27 U/L — ABNORMAL LOW (ref 38–126)
Anion gap: 10 (ref 5–15)
BUN: 19 mg/dL (ref 6–20)
CO2: 20 mmol/L — ABNORMAL LOW (ref 22–32)
Calcium: 8.3 mg/dL — ABNORMAL LOW (ref 8.9–10.3)
Chloride: 97 mmol/L — ABNORMAL LOW (ref 98–111)
Creatinine, Ser: 1.4 mg/dL — ABNORMAL HIGH (ref 0.44–1.00)
GFR, Estimated: 54 mL/min — ABNORMAL LOW (ref >60)
Glucose, Bld: 106 mg/dL — ABNORMAL HIGH (ref 70–99)
Potassium: 4.3 mmol/L (ref 3.5–5.1)
Sodium: 127 mmol/L — ABNORMAL LOW (ref 135–145)
Total Bilirubin: 1.2 mg/dL (ref 0.3–1.2)
Total Protein: 7.5 g/dL (ref 6.5–8.1)

## 2021-02-22 LAB — I-STAT BETA HCG BLOOD, ED (MC, WL, AP ONLY): I-stat hCG, quantitative: <5 m[IU]/mL (ref <5)

## 2021-02-22 LAB — RAPID HIV SCREEN (HIV 1/2 AB+AG)
HIV 1/2 Antibodies: NONREACTIVE
HIV-1 P24 Antigen - HIV24: NONREACTIVE

## 2021-02-22 LAB — CSF CELL COUNT WITH DIFFERENTIAL
Lymphs, CSF: 15 % — ABNORMAL LOW (ref 40–80)
Lymphs, CSF: 21 % — ABNORMAL LOW (ref 40–80)
Monocyte-Macrophage-Spinal Fluid: 2 % — ABNORMAL LOW (ref 15–45)
RBC Count, CSF: 21 /mm3 — ABNORMAL HIGH
RBC Count, CSF: 7 /mm3 — ABNORMAL HIGH
Segmented Neutrophils-CSF: 77 % — ABNORMAL HIGH (ref 0–6)
Segmented Neutrophils-CSF: 85 % — ABNORMAL HIGH (ref 0–6)
Tube #: 1
Tube #: 4
WBC, CSF: 15 /mm3 (ref 0–5)
WBC, CSF: 19 /mm3 (ref 0–5)

## 2021-02-22 LAB — I-STAT CHEM 8, ED
BUN: 21 mg/dL — ABNORMAL HIGH (ref 6–20)
Calcium, Ion: 1.03 mmol/L — ABNORMAL LOW (ref 1.15–1.40)
Chloride: 98 mmol/L (ref 98–111)
Creatinine, Ser: 1.2 mg/dL — ABNORMAL HIGH (ref 0.44–1.00)
Glucose, Bld: 97 mg/dL (ref 70–99)
HCT: 38 % (ref 36.0–46.0)
Hemoglobin: 12.9 g/dL (ref 12.0–15.0)
Potassium: 4.5 mmol/L (ref 3.5–5.1)
Sodium: 130 mmol/L — ABNORMAL LOW (ref 135–145)
TCO2: 21 mmol/L — ABNORMAL LOW (ref 22–32)

## 2021-02-22 LAB — LACTIC ACID, PLASMA
Lactic Acid, Venous: 1.5 mmol/L (ref 0.5–1.9)
Lactic Acid, Venous: 1.4 mmol/L (ref 0.5–1.9)

## 2021-02-22 LAB — TSH: TSH: 0.53 u[IU]/mL (ref 0.350–4.500)

## 2021-02-22 LAB — PROTIME-INR
INR: 1.2 (ref 0.8–1.2)
Prothrombin Time: 14.8 seconds (ref 11.4–15.2)

## 2021-02-22 LAB — RESP PANEL BY RT-PCR (FLU A&B, COVID) ARPGX2
Influenza A by PCR: NEGATIVE
Influenza B by PCR: NEGATIVE
SARS Coronavirus 2 by RT PCR: NEGATIVE

## 2021-02-22 LAB — LIPASE, BLOOD: Lipase: 170 U/L — ABNORMAL HIGH (ref 11–51)

## 2021-02-22 LAB — GLUCOSE, CSF: Glucose, CSF: 30 mg/dL — ABNORMAL LOW (ref 40–70)

## 2021-02-22 LAB — PROTEIN, CSF: Total  Protein, CSF: 120 mg/dL — ABNORMAL HIGH (ref 15–45)

## 2021-02-22 LAB — APTT: aPTT: 32 seconds (ref 24–36)

## 2021-02-22 MED ORDER — DEXAMETHASONE SODIUM PHOSPHATE 10 MG/ML IJ SOLN
10.0000 mg | Freq: Once | INTRAMUSCULAR | Status: AC
  Administered 2021-02-22: 10 mg via INTRAVENOUS
  Filled 2021-02-22: qty 1

## 2021-02-22 MED ORDER — ACETAMINOPHEN 325 MG PO TABS
650.0000 mg | ORAL_TABLET | Freq: Four times a day (QID) | ORAL | Status: DC | PRN
  Administered 2021-02-24 – 2021-02-28 (×3): 650 mg via ORAL
  Filled 2021-02-22 (×3): qty 2

## 2021-02-22 MED ORDER — IOHEXOL 300 MG/ML  SOLN
100.0000 mL | Freq: Once | INTRAMUSCULAR | Status: AC | PRN
  Administered 2021-02-22: 100 mL via INTRAVENOUS

## 2021-02-22 MED ORDER — LACTATED RINGERS IV BOLUS
1000.0000 mL | Freq: Once | INTRAVENOUS | Status: AC
  Administered 2021-02-22: 1000 mL via INTRAVENOUS

## 2021-02-22 MED ORDER — LACTATED RINGERS IV BOLUS (SEPSIS)
1000.0000 mL | Freq: Once | INTRAVENOUS | Status: AC
  Administered 2021-02-22: 1000 mL via INTRAVENOUS

## 2021-02-22 MED ORDER — LORAZEPAM 2 MG/ML IJ SOLN
1.0000 mg | Freq: Once | INTRAMUSCULAR | Status: AC
  Administered 2021-02-22: 1 mg via INTRAVENOUS
  Filled 2021-02-22: qty 1

## 2021-02-22 MED ORDER — DEXTROSE 5 % IV SOLN
10.0000 mg/kg | Freq: Three times a day (TID) | INTRAVENOUS | Status: DC
  Administered 2021-02-22 – 2021-02-28 (×17): 610 mg via INTRAVENOUS
  Filled 2021-02-22 (×21): qty 12.2

## 2021-02-22 MED ORDER — PROPOFOL 10 MG/ML IV BOLUS
0.5000 mg/kg | Freq: Once | INTRAVENOUS | Status: DC
  Filled 2021-02-22: qty 20

## 2021-02-22 MED ORDER — LACTATED RINGERS IV SOLN: INTRAVENOUS | Status: DC

## 2021-02-22 MED ORDER — ONDANSETRON HCL 4 MG PO TABS: 4.0000 mg | ORAL_TABLET | Freq: Four times a day (QID) | ORAL | Status: DC | PRN

## 2021-02-22 MED ORDER — VANCOMYCIN HCL IN DEXTROSE 1-5 GM/200ML-% IV SOLN: 1000.0000 mg | Freq: Once | INTRAVENOUS | Status: DC

## 2021-02-22 MED ORDER — ACETAMINOPHEN 650 MG RE SUPP
650.0000 mg | Freq: Once | RECTAL | Status: AC
  Administered 2021-02-22: 650 mg via RECTAL
  Filled 2021-02-22: qty 1

## 2021-02-22 MED ORDER — VANCOMYCIN HCL IN DEXTROSE 750-5 MG/150ML-% IV SOLN
750.0000 mg | Freq: Two times a day (BID) | INTRAVENOUS | Status: DC
  Administered 2021-02-23: 750 mg via INTRAVENOUS
  Filled 2021-02-22 (×4): qty 150

## 2021-02-22 MED ORDER — METRONIDAZOLE 500 MG/100ML IV SOLN
500.0000 mg | Freq: Once | INTRAVENOUS | Status: AC
  Administered 2021-02-22: 500 mg via INTRAVENOUS
  Filled 2021-02-22: qty 100

## 2021-02-22 MED ORDER — SODIUM CHLORIDE 0.9 % IV SOLN: INTRAVENOUS | Status: DC

## 2021-02-22 MED ORDER — PROPOFOL 10 MG/ML IV BOLUS
INTRAVENOUS | Status: AC | PRN
  Administered 2021-02-22 (×3): 30.4 mg via INTRAVENOUS

## 2021-02-22 MED ORDER — LORAZEPAM 2 MG/ML IJ SOLN
1.0000 mg | Freq: Once | INTRAMUSCULAR | Status: DC | PRN
  Filled 2021-02-22: qty 1

## 2021-02-22 MED ORDER — SODIUM CHLORIDE 0.9% FLUSH
3.0000 mL | Freq: Two times a day (BID) | INTRAVENOUS | Status: DC
  Administered 2021-02-23 – 2021-03-04 (×15): 3 mL via INTRAVENOUS

## 2021-02-22 MED ORDER — ENOXAPARIN SODIUM 40 MG/0.4ML IJ SOSY
40.0000 mg | PREFILLED_SYRINGE | INTRAMUSCULAR | Status: DC
  Administered 2021-02-22 – 2021-03-03 (×10): 40 mg via SUBCUTANEOUS
  Filled 2021-02-22 (×10): qty 0.4

## 2021-02-22 MED ORDER — VANCOMYCIN HCL 1.25 G IV SOLR
1250.0000 mg | Freq: Once | INTRAVENOUS | Status: DC
  Filled 2021-02-22: qty 25

## 2021-02-22 MED ORDER — SODIUM CHLORIDE 0.9 % IV SOLN
2.0000 g | Freq: Two times a day (BID) | INTRAVENOUS | Status: DC
  Administered 2021-02-22 – 2021-02-26 (×8): 2 g via INTRAVENOUS
  Filled 2021-02-22 (×9): qty 20

## 2021-02-22 MED ORDER — ONDANSETRON HCL 4 MG/2ML IJ SOLN: 4.0000 mg | Freq: Four times a day (QID) | INTRAMUSCULAR | Status: DC | PRN

## 2021-02-22 MED ORDER — LACTATED RINGERS IV SOLN: INTRAVENOUS | Status: AC

## 2021-02-22 MED ORDER — SODIUM CHLORIDE 0.9 % IV SOLN: 2.0000 g | Freq: Three times a day (TID) | INTRAVENOUS | Status: DC

## 2021-02-22 MED ORDER — VANCOMYCIN HCL 1.25 G IV SOLR: 1250.0000 mg | Freq: Once | INTRAVENOUS | Status: DC

## 2021-02-22 MED ORDER — PROPOFOL 500 MG/50ML IV EMUL
INTRAVENOUS | Status: AC | PRN
  Administered 2021-02-22: 1824 mg via INTRAVENOUS

## 2021-02-22 MED ORDER — SODIUM CHLORIDE 0.9 % IV SOLN
2.0000 g | Freq: Once | INTRAVENOUS | Status: AC
  Administered 2021-02-22: 2 g via INTRAVENOUS
  Filled 2021-02-22: qty 2

## 2021-02-22 MED ORDER — ACETAMINOPHEN 650 MG RE SUPP
650.0000 mg | Freq: Four times a day (QID) | RECTAL | Status: DC | PRN
  Administered 2021-02-23: 650 mg via RECTAL
  Filled 2021-02-22: qty 1

## 2021-02-22 MED ORDER — VANCOMYCIN HCL 1.25 G IV SOLR: 1250.0000 mg | INTRAVENOUS | Status: DC

## 2021-02-22 MED ORDER — VANCOMYCIN HCL 1250 MG/250ML IV SOLN
1250.0000 mg | Freq: Once | INTRAVENOUS | Status: AC
  Administered 2021-02-22: 1250 mg via INTRAVENOUS
  Filled 2021-02-22: qty 250

## 2021-02-22 MED ORDER — LORAZEPAM 2 MG/ML IJ SOLN
1.0000 mg | Freq: Once | INTRAMUSCULAR | Status: AC | PRN
  Administered 2021-02-22: 1 mg via INTRAVENOUS
  Filled 2021-02-22: qty 1

## 2021-02-22 NOTE — Progress Notes (Addendum)
Pharmacy Antibiotic Note  Erin French is a 25 y.o. female admitted on 02/22/2021 with sepsis.  Pharmacy has been consulted for cefepime and vancomycin dosing.  Patient presenting with AMS, fever >> Code sepsis activated.  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/hr running  Lorelei Pont, PharmD, BCPS 02/22/2021 7:39 PM ED Clinical Pharmacist -  [phone]  Addendum #2: Vancomycin per pharmacy for CNS infection consult placed Dose adjusted to 750mg  q12h per nomogram as AUC dosing not recommended Same monitoring as above  Lorelei Pont, PharmD, BCPS 02/22/2021 9:41 PM ED Clinical Pharmacist -  [phone]   Height: 5' 4.5" (163.8 cm) Weight: 60.8 kg (134 lb) IBW/kg (Calculated) : 55.85  Temp (24hrs), Avg:102.9 F (39.4 C), Min:102.9 F (39.4 C), Max:102.9 F (39.4 C)  Recent Labs  Lab 02/22/21 1346  WBC 4.9    CrCl cannot be calculated (Patient's most recent lab result is older than the maximum 21 days allowed.).    No Known Allergies  Antimicrobials this admission: metronidazole 12/31 >>  vancomycin 12/31 >>  cefepime 12/31 >>   Microbiology results: Pending  Thank you for allowing pharmacy to be a part of this patients care.  Lorelei Pont, PharmD, BCPS 02/22/2021 2:19 PM ED Clinical Pharmacist -  [phone]

## 2021-02-22 NOTE — Progress Notes (Signed)
Elink following code sepsis °

## 2021-02-22 NOTE — ED Provider Notes (Signed)
[facility] EMERGENCY DEPARTMENT Provider Note   CSN: [id number] Arrival date & time: 02/22/21  1311     History Chief Complaint  Patient presents with   Altered Mental Status   Fever   Code Sepsis    Lashawndra Vankirk is a 25 y.o. female.  HPI Patient presents ill with fever.  She is a limited historian.  She is denying pain.  Patient is febrile and was found by friends or roommates in her apartment naked and confused.  Patient had been seen in the emergency department 6 days earlier and at that time felt to have a viral syndrome.  Patient does not have other contacts or family members for additional history.  Unclear what happened during the time from her evaluation until now.  She is denying pain but also seems confused.  She is not endorsing vomiting.  She does not endorse drug use.    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   Fibroid uterus 11/21/2018   S/P primary low transverse C-section 11/01/2018   Malpresentation before onset of labor 11/01/2018    Past Surgical History:  Procedure Laterality Date   CESAREAN SECTION N/A 11/17/2018   Procedure: CESAREAN SECTION;  Surgeon: Aletha Halim, MD;  Location: [facility] LD ORS;  Service: Obstetrics;  Laterality: N/A;   INDUCED ABORTION       OB History     Gravida  4   Para  2   Term  2   Preterm      AB  2   Living  2      SAB      IAB      Ectopic      Multiple  0   Live Births  2           Family History  Problem Relation Age of Onset   Hypertension Father    Schizophrenia Mother    Hypertension Maternal Grandmother     Social History   Tobacco Use   Smoking status: Never   Smokeless tobacco: Never  Vaping Use   Vaping Use: Never used  Substance Use Topics   Alcohol use: Not Currently    Alcohol/week: 0.0 standard drinks    Comment: Last drink December  2019   Drug use: No    Home Medications Prior to Admission medications   Medication Sig Start Date End Date Taking? Authorizing Provider  ferrous sulfate 325 (65 FE) MG tablet Take 1 tablet (325 mg total) by mouth 2 (two) times daily with a meal. Patient not taking: Reported on 12/06/2018 11/19/18 12/19/18  Lattie Haw, MD  metroNIDAZOLE (FLAGYL) 500 MG tablet Take 1 tablet (500 mg total) by mouth 2 (two) times daily. 02/15/20   Griffin Basil, MD    Allergies    Patient has no known allergies.  Review of Systems   Review of Systems 5 caveat unreliable review of systems due to mental status Physical Exam Updated Vital Signs BP (!) 100/59    Pulse (!) 114    Temp (!) 102.9 F (39.4 C) (Oral)    Resp (!) 36    Ht 5' 4.5" (1.638 m)    Wt 60.8 kg    SpO2 98%    BMI 22.65 kg/m   Physical Exam Constitutional:      Comments: Patient is mild to moderately  confused.  She will orient to me and respond but is somewhat difficult to understand.  No respiratory distress at rest.  HENT:     Head:     Comments: No facial trauma.    Ears:     Comments: Cerumen in the right ear canal.  Left ear canal partially obscured by cerumen no obvious erythema or bulging.    Mouth/Throat:     Comments: Lips are extremely dry and cracked.  Tongue has whitish plaque present.  Erythema on the soft palate. Eyes:     Comments: Bilateral scleral injection.'s are symmetric and responsive.  Neck:     Comments: Anterior neck supple.  No gross lymphadenopathy.  No meningismus.  Patient will spontaneously turn her head and look at me and interact. Cardiovascular:     Comments: Tachycardia no gross rub murmur gallop Pulmonary:     Effort: Pulmonary effort is normal.     Breath sounds: Normal breath sounds.  Abdominal:     Comments: Abdomen is soft but seems mildly tender with palpation.  Patient is not focally endorsing pain.  Genitourinary:    Comments: General visual inspection of vulva and rectal area is  normal.  Slight discharge around the vagina. Musculoskeletal:     Comments: No peripheral edema.  No rashes on the extremities.  Feet and hands without rashes.  No evidence of track marks or significant injuries.  Skin:    General: Skin is warm and dry.  Neurological:     Comments: Patient is confused but somewhat helpful and following commands.  She is neurologically intact.  She makes some responses that are clearly situationally oriented.  She is a poor historian.    ED Results / Procedures / Treatments   Labs (all labs ordered are listed, but only abnormal results are displayed) Labs Reviewed  COMPREHENSIVE METABOLIC PANEL - Abnormal; Notable for the following components:      Result Value   Sodium 127 (*)    Chloride 97 (*)    CO2 20 (*)    Glucose, Bld 106 (*)    Creatinine, Ser 1.40 (*)    Calcium 8.3 (*)    Albumin 3.4 (*)    AST 126 (*)    Alkaline Phosphatase 27 (*)    GFR, Estimated 54 (*)    All other components within normal limits  CBC WITH DIFFERENTIAL/PLATELET - Abnormal; Notable for the following components:   Hemoglobin 11.2 (*)    HCT 33.2 (*)    MCV 77.6 (*)    Lymphs Abs 0.6 (*)    Monocytes Absolute 0.0 (*)    All other components within normal limits  LIPASE, BLOOD - Abnormal; Notable for the following components:   Lipase 170 (*)    All other components within normal limits  I-STAT CHEM 8, ED - Abnormal; Notable for the following components:   Sodium 130 (*)    BUN 21 (*)    Creatinine, Ser 1.20 (*)    Calcium, Ion 1.03 (*)    TCO2 21 (*)    All other components within normal limits  I-STAT VENOUS BLOOD GAS, ED - Abnormal; Notable for the following components:   pCO2, Ven 33.0 (*)    pO2, Ven 50.0 (*)    Sodium 130 (*)    Calcium, Ion 1.04 (*)    All other components within normal limits  RESP PANEL BY RT-PCR (FLU A&B, COVID) ARPGX2  CULTURE, BLOOD (ROUTINE X 2)  CULTURE, BLOOD (ROUTINE X 2)  URINE CULTURE  LACTIC ACID, PLASMA  PROTIME-INR   APTT  RAPID HIV SCREEN (HIV 1/2 AB+AG)  LACTIC ACID, PLASMA  URINALYSIS, ROUTINE W REFLEX MICROSCOPIC  RAPID URINE DRUG SCREEN, HOSP PERFORMED  I-STAT BETA HCG BLOOD, ED ([facility], [facility], [facility] ONLY)    EKG EKG Interpretation  Date/Time:  Saturday February 22 2021 13:39:22 EST Ventricular Rate:  133 PR Interval:  104 QRS Duration: 80 QT Interval:  285 QTC Calculation: 424 R Axis:   99 Text Interpretation: Sinus tachycardia Multiform ventricular premature complexes Borderline right axis deviation no old comparison Confirmed by Charlesetta Shanks [phone]) on 02/22/2021 3:30:01 PM  Radiology DG Chest Port 1 View  Result Date: 02/22/2021 CLINICAL DATA:  Sepsis EXAM: PORTABLE CHEST 1 VIEW COMPARISON:  None. FINDINGS: The heart size and mediastinal contours are within normal limits. Both lungs are clear. There is curvature of spine. IMPRESSION: No active disease. Electronically Signed   By: Abelardo Diesel M.D.   On: 02/22/2021 15:26    Procedures Procedures  CRITICAL CARE Performed by: Charlesetta Shanks   Total critical care time: 30 minutes  Critical care time was exclusive of separately billable procedures and treating other patients.  Critical care was necessary to treat or prevent imminent or life-threatening deterioration.  Critical care was time spent personally by me on the following activities: development of treatment plan with patient and/or surrogate as well as nursing, discussions with consultants, evaluation of patient's response to treatment, examination of patient, obtaining history from patient or surrogate, ordering and performing treatments and interventions, ordering and review of laboratory studies, ordering and review of radiographic studies, pulse oximetry and re-evaluation of patient's condition.  Medications Ordered in ED Medications  lactated ringers infusion (has no administration in time range)  lactated ringers bolus 1,000 mL (0 mLs Intravenous Stopped 02/22/21 1615)     And  lactated ringers bolus 1,000 mL (1,000 mLs Intravenous New Bag/Given 02/22/21 1614)  vancomycin (VANCOREADY) IVPB 1250 mg/250 mL (1,250 mg Intravenous New Bag/Given 02/22/21 1538)  LORazepam (ATIVAN) injection 1 mg (has no administration in time range)  ceFEPIme (MAXIPIME) 2 g in sodium chloride 0.9 % 100 mL IVPB (0 g Intravenous Stopped 02/22/21 1531)  metroNIDAZOLE (FLAGYL) IVPB 500 mg (0 mg Intravenous Stopped 02/22/21 1615)  acetaminophen (TYLENOL) suppository 650 mg (650 mg Rectal Given 02/22/21 1446)    ED Course  I have reviewed the triage vital signs and the nursing notes.  Pertinent labs & imaging results that were available during my care of the patient were reviewed by me and considered in my medical decision making (see chart for details).    MDM Rules/Calculators/A&P                         Patient presents as outlined.  She is ill in appearance.  She is dehydrated in appearance.  Patient is febrile.  Concern for sepsis.  Sepsis protocol initiated.  Broad-spectrum antibiotics initiated.  At this time, exact source is unclear.  Currently COVID and influenza testing are negative.  We will proceed also with CT scanning with mildly tender abdomen and possible source of infection.  At this time fluid resuscitation and fever control initiated.  Broad-spectrum antibiotics administered.  Dr. Ardith Dark will follow-up on remainder of diagnostic evaluation for final disposition.  Patient will require admission.   Final Clinical Impression(s) / ED Diagnoses Final diagnoses:  Sepsis ([place])    Rx / DC Orders ED Discharge Orders     None  Charlesetta Shanks, MD 02/26/21 [phone]

## 2021-02-22 NOTE — ED Triage Notes (Signed)
Seen here last week had viral infection and prescribed antibiotics. Unsure if she took it.  Family friend came to check on her and found her AMS and slurring words.  Fever tachy and tachypneic.  No one has seen her since last Saturday.  Patient has blood crusted around lips.

## 2021-02-22 NOTE — ED Provider Notes (Signed)
3:50 PM Care transferred to me. Upon my assessment, patient is profoundly altered.  She does not seem to know where she is, what month it is, or what the situation is.  I think she will need a head CT and she is pretty tender in her abdomen so I will order an abdominal CT.    6:32 PM Patient has remained hemodynamically stable. Unfortunately, she is still altered and required ativan to get CTs done. They are overall unremarkable, though it appears she is retaining urine (urinated a small amount but minimal and now it's causing hydro). I do not think an obvious cause of fever/altered mental status has been found so she will need a lumbar puncture.  Unfortunately she will need to be sedated by emergent consent as she is not capable of consenting herself or refusing given lack of medical decision-making capacity.  She will then be admitted.  7:23 PM Successful LP. Sent to lab, acyclovir also ordered. Already given vanc/cefepime.  Will consult medicine for admission.   Dr. Myna Hidalgo to admit.   .Critical Care Performed by: Sherwood Gambler, MD Authorized by: Sherwood Gambler, MD   Critical care provider statement:    Critical care time (minutes):  50   Critical care time was exclusive of:  Separately billable procedures and treating other patients   Critical care was necessary to treat or prevent imminent or life-threatening deterioration of the following conditions:  CNS failure or compromise and sepsis   Critical care was time spent personally by me on the following activities:  Development of treatment plan with patient or surrogate, discussions with consultants, evaluation of patient's response to treatment, examination of patient, ordering and review of laboratory studies, ordering and review of radiographic studies, ordering and performing treatments and interventions, pulse oximetry, re-evaluation of patient's condition and review of old charts .Sedation  Date/Time: 02/22/2021 7:24 PM Performed by:  Sherwood Gambler, MD Authorized by: Sherwood Gambler, MD   Consent:    Consent obtained:  Emergent situation Universal protocol:    Immediately prior to procedure, a time out was called: yes   Indications:    Procedure performed:  Lumbar puncture   Procedure necessitating sedation performed by:  Physician performing sedation Pre-sedation assessment:    Time since last food or drink:  Unknown   NPO status caution: unable to specify NPO status     ASA classification: class 1 - normal, healthy patient     Mallampati score:  Unable to assess (does not open mouth to command)   Pre-sedation assessments completed and reviewed: airway patency, cardiovascular function, hydration status, mental status, nausea/vomiting, pain level, respiratory function and temperature   Immediate pre-procedure details:    Reviewed: vital signs and relevant labs/tests     Verified: bag valve mask available, emergency equipment available, intubation equipment available, IV patency confirmed, oxygen available and suction available   Procedure details (see MAR for exact dosages):    Preoxygenation:  Nasal cannula   Sedation:  Propofol   Intended level of sedation: deep   Intra-procedure monitoring:  Cardiac monitor, blood pressure monitoring, continuous pulse oximetry, continuous capnometry, frequent LOC assessments and frequent vital sign checks   Intra-procedure events: none     Total Provider sedation time (minutes):  15 Post-procedure details:    Attendance: Constant attendance by certified staff until patient recovered     Post-sedation assessments completed and reviewed: airway patency, cardiovascular function, hydration status, mental status, nausea/vomiting, respiratory function and temperature     Patient is stable for  discharge or admission: yes     Procedure completion:  Tolerated well, no immediate complications .Lumbar Puncture  Date/Time: 02/22/2021 7:26 PM Performed by: Sherwood Gambler, MD Authorized  by: Sherwood Gambler, MD   Consent:    Consent obtained:  Emergent situation Universal protocol:    Patient identity confirmed:  Arm band Pre-procedure details:    Procedure purpose:  Diagnostic   Preparation: Patient was prepped and draped in usual sterile fashion   Sedation:    Sedation type:  Deep Anesthesia:    Anesthesia method:  Local infiltration   Local anesthetic:  Lidocaine 1% w/o epi Procedure details:    Lumbar space:  L4-L5 interspace   Patient position:  R lateral decubitus   Needle gauge:  20   Number of attempts:  2   Fluid appearance:  Clear   Tubes of fluid:  4   Total volume (ml):  7 Post-procedure details:    Puncture site:  Adhesive bandage applied   Procedure completion:  Tolerated well, no immediate complications    Sherwood Gambler, MD 02/22/21 2131

## 2021-02-22 NOTE — H&P (Signed)
History and Physical    Erin French QIO:[id number] DOB: [birth year] DOA: 02/22/2021  PCP: Karleen Dolphin, MD (Inactive)   Patient coming from: Home   Chief Complaint: AMS   HPI: Erin French is a pleasant 25 y.o. female with medical history significant for chlamydia, now presenting to the emergency department with altered mental status.  Patient was seen in the ED a week ago complaining of fevers, chills, aches, and lower abdominal pain.  She was suspected to have a viral syndrome at that time and went home.  Patient is unable to provide much history at all, there is no visitor, and emergency contact number not working.  A family friend reportedly checked on her today and found her to be altered.  They indicated that no one had seen her since 1 week ago.  ED Course: Upon arrival to the ED, patient is found to be febrile to 39.4 C, saturating upper 90s on room air, tachypneic, tachycardic, and with SBP 89-141.  CBC with mild microcytic anemia.  CMP notable for sodium 127, creatinine 1.4, and AST 126.  Lactic acid normal.  COVID and influenza negative.  Head CT negative for acute finding.  CT of the chest/abdomen/pelvis notable for distended urinary bladder and mild bilateral hydroureteronephrosis.  Also noted on CT is borderline enlarged right axillary lymph nodes.  Blood and urine cultures were collected in the ED, 3 L of LR was administered, and the patient was treated with acetaminophen, vancomycin, cefepime, Flagyl, and IV acyclovir.  She required Ativan and propofol in order to obtain CT scans, and LP was also performed with CSF sent to the lab for analysis.  Review of Systems:  ROS limited by patient's clinical condition.  Past Medical History:  Diagnosis Date   Chlamydia 4/12    Past Surgical History:  Procedure Laterality Date   CESAREAN SECTION N/A 11/17/2018   Procedure: CESAREAN SECTION;  Surgeon: Aletha Halim, MD;  Location: [facility] LD ORS;  Service: Obstetrics;  Laterality: N/A;    INDUCED ABORTION      Social History:   reports that she has never smoked. She has never used smokeless tobacco. She reports that she does not currently use alcohol. She reports that she does not use drugs.  No Known Allergies  Family History  Problem Relation Age of Onset   Hypertension Father    Schizophrenia Mother    Hypertension Maternal Grandmother      Prior to Admission medications   Medication Sig Start Date End Date Taking? Authorizing Provider  ferrous sulfate 325 (65 FE) MG tablet Take 1 tablet (325 mg total) by mouth 2 (two) times daily with a meal. Patient not taking: Reported on 12/06/2018 11/19/18 12/19/18  Lattie Haw, MD  metroNIDAZOLE (FLAGYL) 500 MG tablet Take 1 tablet (500 mg total) by mouth 2 (two) times daily. Patient not taking: Reported on 02/22/2021 02/15/20   Griffin Basil, MD    Physical Exam: Vitals:   02/22/21 1905 02/22/21 1910 02/22/21 1915 02/22/21 1920  BP: (!) 120/95 (!) 97/5 (!) 89/49 100/68  Pulse: (!) 112 (!) 115 (!) 112 (!) 106  Resp: (!) 30 (!) 35 (!) 31 (!) 40  Temp:      TempSrc:      SpO2: 100% 100% 100% 100%  Weight:      Height:        Constitutional: Somnolent, no diaphoresis, no pallor   Eyes: PERTLA, conjunctival injection ENMT: Mucous membranes are dry. Posterior pharynx clear of any exudate or lesions.  Neck: supple, no masses  Respiratory: tachypneic. No wheezing, no crackles.  Cardiovascular: Rate ~100 and regular. No extremity edema.  Abdomen: No distension, soft, suprapubic tenderness. Bowel sounds active.  Musculoskeletal: no clubbing / cyanosis. No joint deformity upper and lower extremities.   Skin: no significant rashes, lesions, ulcers. Warm, dry, well-perfused. Neurologic: Somnolent, wakes to voice and makes brief eye contact, follows some commands, but not answering questions. Moving all extremities.     Labs and Imaging on Admission: I have personally reviewed following labs and imaging  studies  CBC: Recent Labs  Lab 02/22/21 1346 02/22/21 1447  WBC 4.9  --   NEUTROABS 4.3  --   HGB 11.2* 12.2   12.9  HCT 33.2* 36.0   38.0  MCV 77.6*  --   PLT 154  --    Basic Metabolic Panel: Recent Labs  Lab 02/22/21 1346 02/22/21 1447  NA 127* 130*   130*  K 4.3 4.5   4.5  CL 97* 98  CO2 20*  --   GLUCOSE 106* 97  BUN 19 21*  CREATININE 1.40* 1.20*  CALCIUM 8.3*  --    GFR: Estimated Creatinine Clearance: 63.2 mL/min (A) (by C-G formula based on SCr of 1.2 mg/dL (H)). Liver Function Tests: Recent Labs  Lab 02/22/21 1346  AST 126*  ALT 21  ALKPHOS 27*  BILITOT 1.2  PROT 7.5  ALBUMIN 3.4*   Recent Labs  Lab 02/22/21 1446  LIPASE 170*   No results for input(s): AMMONIA in the last 168 hours. Coagulation Profile: Recent Labs  Lab 02/22/21 1346  INR 1.2   Cardiac Enzymes: No results for input(s): CKTOTAL, CKMB, CKMBINDEX, TROPONINI in the last 168 hours. BNP (last 3 results) No results for input(s): PROBNP in the last 8760 hours. HbA1C: No results for input(s): HGBA1C in the last 72 hours. CBG: No results for input(s): GLUCAP in the last 168 hours. Lipid Profile: No results for input(s): CHOL, HDL, LDLCALC, TRIG, CHOLHDL, LDLDIRECT in the last 72 hours. Thyroid Function Tests: Recent Labs    02/22/21 1716  TSH 0.530   Anemia Panel: No results for input(s): VITAMINB12, FOLATE, FERRITIN, TIBC, IRON, RETICCTPCT in the last 72 hours. Urine analysis:    Component Value Date/Time   COLORURINE AMBER (A) 02/22/2021 1319   APPEARANCEUR HAZY (A) 02/22/2021 1319   LABSPEC 1.024 02/22/2021 1319   PHURINE 6.0 02/22/2021 1319   GLUCOSEU NEGATIVE 02/22/2021 1319   HGBUR LARGE (A) 02/22/2021 1319   BILIRUBINUR NEGATIVE 02/22/2021 1319   BILIRUBINUR neg 04/10/2015 1029   KETONESUR 20 (A) 02/22/2021 1319   PROTEINUR 100 (A) 02/22/2021 1319   UROBILINOGEN negative 04/10/2015 1029   UROBILINOGEN 0.2 06/27/2013 0949   NITRITE NEGATIVE 02/22/2021 1319    LEUKOCYTESUR MODERATE (A) 02/22/2021 1319   Sepsis Labs: @LABRCNTIP (procalcitonin:4,lacticidven:4) ) Recent Results (from the past 240 hour(s))  Resp Panel by RT-PCR (Flu A&B, Covid) Nasopharyngeal Swab     Status: None   Collection Time: 02/15/21 11:24 PM   Specimen: Nasopharyngeal Swab; Nasopharyngeal(NP) swabs in vial transport medium  Result Value Ref Range Status   SARS Coronavirus 2 by RT PCR NEGATIVE NEGATIVE Final    Comment: (NOTE) SARS-CoV-2 target nucleic acids are NOT DETECTED.  The SARS-CoV-2 RNA is generally detectable in upper respiratory specimens during the acute phase of infection. The lowest concentration of SARS-CoV-2 viral copies this assay can detect is 138 copies/mL. A negative result does not preclude SARS-Cov-2 infection and should not be used as the sole  basis for treatment or other patient management decisions. A negative result may occur with  improper specimen collection/handling, submission of specimen other than nasopharyngeal swab, presence of viral mutation(s) within the areas targeted by this assay, and inadequate number of viral copies(<138 copies/mL). A negative result must be combined with clinical observations, patient history, and epidemiological information. The expected result is Negative.  Fact Sheet for Patients:  EntrepreneurPulse.com.au  Fact Sheet for Healthcare Providers:  IncredibleEmployment.be  This test is no t yet approved or cleared by the [place] FDA and  has been authorized for detection and/or diagnosis of SARS-CoV-2 by FDA under an Emergency Use Authorization (EUA). This EUA will remain  in effect (meaning this test can be used) for the duration of the COVID-19 declaration under Section 564(b)(1) of the Act, 21 U.S.C.section 360bbb-3(b)(1), unless the authorization is terminated  or revoked sooner.       Influenza A by PCR NEGATIVE NEGATIVE Final   Influenza B by PCR NEGATIVE  NEGATIVE Final    Comment: (NOTE) The Xpert Xpress SARS-CoV-2/FLU/RSV plus assay is intended as an aid in the diagnosis of influenza from Nasopharyngeal swab specimens and should not be used as a sole basis for treatment. Nasal washings and aspirates are unacceptable for Xpert Xpress SARS-CoV-2/FLU/RSV testing.  Fact Sheet for Patients: EntrepreneurPulse.com.au  Fact Sheet for Healthcare Providers: IncredibleEmployment.be  This test is not yet approved or cleared by the [place] FDA and has been authorized for detection and/or diagnosis of SARS-CoV-2 by FDA under an Emergency Use Authorization (EUA). This EUA will remain in effect (meaning this test can be used) for the duration of the COVID-19 declaration under Section 564(b)(1) of the Act, 21 U.S.C. section 360bbb-3(b)(1), unless the authorization is terminated or revoked.  Performed at [facility], [place] [address], [place], [place] [address]   Resp Panel by RT-PCR (Flu A&B, Covid) Nasopharyngeal Swab     Status: None   Collection Time: 02/22/21  1:42 PM   Specimen: Nasopharyngeal Swab; Nasopharyngeal(NP) swabs in vial transport medium  Result Value Ref Range Status   SARS Coronavirus 2 by RT PCR NEGATIVE NEGATIVE Final    Comment: (NOTE) SARS-CoV-2 target nucleic acids are NOT DETECTED.  The SARS-CoV-2 RNA is generally detectable in upper respiratory specimens during the acute phase of infection. The lowest concentration of SARS-CoV-2 viral copies this assay can detect is 138 copies/mL. A negative result does not preclude SARS-Cov-2 infection and should not be used as the sole basis for treatment or other patient management decisions. A negative result may occur with  improper specimen collection/handling, submission of specimen other than nasopharyngeal swab, presence of viral mutation(s) within the areas targeted by this assay, and inadequate number of viral copies(<138  copies/mL). A negative result must be combined with clinical observations, patient history, and epidemiological information. The expected result is Negative.  Fact Sheet for Patients:  EntrepreneurPulse.com.au  Fact Sheet for Healthcare Providers:  IncredibleEmployment.be  This test is no t yet approved or cleared by the [place] FDA and  has been authorized for detection and/or diagnosis of SARS-CoV-2 by FDA under an Emergency Use Authorization (EUA). This EUA will remain  in effect (meaning this test can be used) for the duration of the COVID-19 declaration under Section 564(b)(1) of the Act, 21 U.S.C.section 360bbb-3(b)(1), unless the authorization is terminated  or revoked sooner.       Influenza A by PCR NEGATIVE NEGATIVE Final   Influenza B by PCR NEGATIVE NEGATIVE Final  Comment: (NOTE) The Xpert Xpress SARS-CoV-2/FLU/RSV plus assay is intended as an aid in the diagnosis of influenza from Nasopharyngeal swab specimens and should not be used as a sole basis for treatment. Nasal washings and aspirates are unacceptable for Xpert Xpress SARS-CoV-2/FLU/RSV testing.  Fact Sheet for Patients: EntrepreneurPulse.com.au  Fact Sheet for Healthcare Providers: IncredibleEmployment.be  This test is not yet approved or cleared by the [place] FDA and has been authorized for detection and/or diagnosis of SARS-CoV-2 by FDA under an Emergency Use Authorization (EUA). This EUA will remain in effect (meaning this test can be used) for the duration of the COVID-19 declaration under Section 564(b)(1) of the Act, 21 U.S.C. section 360bbb-3(b)(1), unless the authorization is terminated or revoked.  Performed at [facility], [place] [address], [place], [place] [address]   CSF culture     Status: None (Preliminary result)   Collection Time: 02/22/21  6:38 PM   Specimen: Back; Cerebrospinal Fluid   Result Value Ref Range Status   Specimen Description BACK  Final   Special Requests NONE  Final   Gram Stain   Final    CYTOSPIN SMEAR FEW WBC SEEN NO ORGANISMS SEEN Performed at [facility], [address] [place],  [address]    Culture PENDING  Incomplete   Report Status PENDING  Incomplete     Radiological Exams on Admission: CT Head Wo Contrast  Result Date: 02/22/2021 CLINICAL DATA:  Mental status change, unknown cause. Seen here last week had viral infection and prescribed antibiotics. Unsure if she took it. Family friend came to check on her and found her AMS and slurring words. Fever tachy and tachypneic. EXAM: CT HEAD WITHOUT CONTRAST TECHNIQUE: Contiguous axial images were obtained from the base of the skull through the vertex without intravenous contrast. COMPARISON:  None. FINDINGS: Brain: No evidence of large-territorial acute infarction. No parenchymal hemorrhage. No mass lesion. No extra-axial collection. No mass effect or midline shift. No hydrocephalus. Basilar cisterns are patent. Vascular: No hyperdense vessel. Skull: No acute fracture or focal lesion. Sinuses/Orbits: Paranasal sinuses and mastoid air cells are clear. The orbits are unremarkable. Other: None. IMPRESSION: No acute intracranial abnormality. Electronically Signed   By: Iven Finn M.D.   On: 02/22/2021 17:43   CT CHEST ABDOMEN PELVIS W CONTRAST  Result Date: 02/22/2021 CLINICAL DATA:  Respiratory illness, nondiagnostic xray. Seen here last week had viral infection and prescribed antibiotics. Unsure if she took it. Family friend came to check on her and found her AMS and slurring words. Fever tachy and tachypneic. EXAM: CT CHEST, ABDOMEN, AND PELVIS WITH CONTRAST TECHNIQUE: Multidetector CT imaging of the chest, abdomen and pelvis was performed following the standard protocol during bolus administration of intravenous contrast. CONTRAST:  154mL OMNIPAQUE IOHEXOL 300 MG/ML  SOLN COMPARISON:   None. FINDINGS: CT CHEST FINDINGS Cardiovascular: Normal heart size. No significant pericardial effusion. The thoracic aorta is normal in caliber. No atherosclerotic plaque of the thoracic aorta. No coronary artery calcifications. Mediastinum/Nodes: No enlarged mediastinal or hilar lymph nodes. Borderline enlarged right axillary lymph nodes. No definite left lymphadenopathy. Thyroid gland, trachea, and esophagus demonstrate no significant findings. Lungs/Pleura: No focal consolidation. No pulmonary nodule. No pulmonary mass. No pleural effusion. No pneumothorax. Musculoskeletal: No chest wall abnormality. No suspicious lytic or blastic osseous lesions. No acute displaced fracture. CT ABDOMEN PELVIS FINDINGS Hepatobiliary: No focal liver abnormality. No gallstones, gallbladder wall thickening, or pericholecystic fluid. No biliary dilatation. Pancreas: No focal lesion. Normal pancreatic contour. No surrounding  inflammatory changes. No main pancreatic ductal dilatation. Spleen: Normal in size without focal abnormality. A splenule is noted. Adrenals/Urinary Tract: No adrenal nodule bilaterally. Bilateral kidneys enhance symmetrically. Mild bilateral hydroureteronephrosis. Punctate left nephrolithiasis. No right nephrolithiasis. No ureterolithiasis bilaterally. The urinary bladder is distended with urine and otherwise unremarkable. On delayed imaging, there is no urothelial wall thickening and there are no filling defects in the opacified portions of the bilateral collecting systems or ureters. Stomach/Bowel: Stomach is within normal limits. No evidence of bowel wall thickening or dilatation. Medialized mobile cecum. Appendix appears normal. Vascular/Lymphatic: No abdominal aorta or iliac aneurysm. No abdominal, pelvic, or inguinal lymphadenopathy. Reproductive: Calcified anterior uterine wall fibroid. Otherwise uterus and bilateral adnexa are unremarkable. Other: No intraperitoneal free fluid. No intraperitoneal free  gas. No organized fluid collection. Musculoskeletal: No abdominal wall hernia or abnormality. No suspicious lytic or blastic osseous lesions. No acute displaced fracture. IMPRESSION: 1. Borderline enlarged right axillary lymph nodes. Correlate with diagnostic mammography. 2. Mild bilateral hydroureteronephrosis. Associated urinary bladder distention with urine. This may reflect the residual of a recently passed calculus or reflect chronic changes of obstructive uropathy or reflux. 3. Nonobstructive punctate left nephrolithiasis. 4. Likely degenerative uterine fibroid. Electronically Signed   By: Iven Finn M.D.   On: 02/22/2021 18:11   DG Chest Port 1 View  Result Date: 02/22/2021 CLINICAL DATA:  Sepsis EXAM: PORTABLE CHEST 1 VIEW COMPARISON:  None. FINDINGS: The heart size and mediastinal contours are within normal limits. Both lungs are clear. There is curvature of spine. IMPRESSION: No active disease. Electronically Signed   By: Abelardo Diesel M.D.   On: 02/22/2021 15:26    EKG: Independently reviewed. Sinus tachycardia, rate 133, PVCs.   Assessment/Plan   1. Severe sepsis  - Presents after being found altered, reportedly not seen for ~1 wk at which time she was in ED complaining of fever, aches, and lower abd pain   - Found to be febrile, tachycardic, tachypneic, and disoriented - CT chest/abd/pelvis most notable for urinary bladder distension with mild b/l hydro  - LP done in ED, CSF studies could support possible viral or bacterial infection, no organism seen on gram stain  - Blood and urine cultures collected in ED, 30 cc/kg IVF bolused, and she was started on broad-spectrum abx and IV acyclovir  - Meningitis possible, urosepsis also high on DDx given urinary retention and complaints of lower abd pain a wk ago - Continue empiric antibiotics with meningitis coverage, continue acyclovir, follow cultures and clinical course   2. Acute encephalopathy  - Presents with AMS, hx is very  limited  - No acute findings on head CT and normal TSH in ED  - Most likely d/t infection, will treat accordingly, continue IV acyclovir for now, check EEG      3. Acute kidney injury  - SCr is 1.40 on admission, up from baseline of 0.75 in 2020  - Suspect this is acute in setting of sepsis with dehydration, also has distended bladder with mild b/l hydroureteronephrosis on CT in ED  - Foley placed in ED and IVF resuscitation started  - Continue Foley and IVF, renally-dose medications, monitor   4. Hyponatremia  - Serum sodium is 127 in setting of hypovolemia  - Continue IVF resuscitation and repeat chem panel  5. Elevated AST  - AST is 126 in ED with normal ALT and normal bilirubin  - No acute hepatobiliary findings on CT in ED  - Check serum CK, trend   6. Axillary  adenopathy  - Borderline enlarged LNs noted in Rt axilla on CT in ED with radiologist recommending diagnostic mammography    DVT prophylaxis: Lovenox  Code Status: Full  Level of Care: Level of care: Progressive Family Communication: none present, no answer or option to leave message at emergency contact number  Disposition Plan:  Patient is from: Home  Anticipated d/c is to: TBD Anticipated d/c date is: 02/26/21  Patient currently: Pending cultures, improvement in sepsis parameters and mental status  Consults called: none  Admission status: Inpatient     Vianne Bulls, MD [facility]  02/22/2021, 10:33 PM

## 2021-02-22 NOTE — ED Notes (Signed)
Bladder Scan 737 ml

## 2021-02-23 ENCOUNTER — Inpatient Hospital Stay (HOSPITAL_COMMUNITY): Payer: Medicaid Other

## 2021-02-23 DIAGNOSIS — I3139 Other pericardial effusion (noninflammatory): Secondary | ICD-10-CM | POA: Diagnosis not present

## 2021-02-23 DIAGNOSIS — R6520 Severe sepsis without septic shock: Secondary | ICD-10-CM

## 2021-02-23 DIAGNOSIS — G0490 Encephalitis and encephalomyelitis, unspecified: Secondary | ICD-10-CM | POA: Diagnosis not present

## 2021-02-23 DIAGNOSIS — G9340 Encephalopathy, unspecified: Secondary | ICD-10-CM

## 2021-02-23 DIAGNOSIS — A419 Sepsis, unspecified organism: Principal | ICD-10-CM

## 2021-02-23 LAB — COMPREHENSIVE METABOLIC PANEL
ALT: 23 U/L (ref 0–44)
AST: 120 U/L — ABNORMAL HIGH (ref 15–41)
Albumin: 2.9 g/dL — ABNORMAL LOW (ref 3.5–5.0)
Alkaline Phosphatase: 25 U/L — ABNORMAL LOW (ref 38–126)
Anion gap: 9 (ref 5–15)
BUN: 14 mg/dL (ref 6–20)
CO2: 21 mmol/L — ABNORMAL LOW (ref 22–32)
Calcium: 8.2 mg/dL — ABNORMAL LOW (ref 8.9–10.3)
Chloride: 101 mmol/L (ref 98–111)
Creatinine, Ser: 1.03 mg/dL — ABNORMAL HIGH (ref 0.44–1.00)
GFR, Estimated: >60 mL/min (ref >60)
Glucose, Bld: 116 mg/dL — ABNORMAL HIGH (ref 70–99)
Potassium: 5.2 mmol/L — ABNORMAL HIGH (ref 3.5–5.1)
Sodium: 131 mmol/L — ABNORMAL LOW (ref 135–145)
Total Bilirubin: 0.6 mg/dL (ref 0.3–1.2)
Total Protein: 6.4 g/dL — ABNORMAL LOW (ref 6.5–8.1)

## 2021-02-23 LAB — I-STAT ARTERIAL BLOOD GAS, ED
Acid-Base Excess: 0 mmol/L (ref 0.0–2.0)
Bicarbonate: 23.7 mmol/L (ref 20.0–28.0)
Calcium, Ion: 1.17 mmol/L (ref 1.15–1.40)
HCT: 31 % — ABNORMAL LOW (ref 36.0–46.0)
Hemoglobin: 10.5 g/dL — ABNORMAL LOW (ref 12.0–15.0)
O2 Saturation: 99 %
Potassium: 4.7 mmol/L (ref 3.5–5.1)
Sodium: 133 mmol/L — ABNORMAL LOW (ref 135–145)
TCO2: 25 mmol/L (ref 22–32)
pCO2 arterial: 33.3 mmHg (ref 32.0–48.0)
pH, Arterial: 7.459 — ABNORMAL HIGH (ref 7.350–7.450)
pO2, Arterial: 147 mmHg — ABNORMAL HIGH (ref 83.0–108.0)

## 2021-02-23 LAB — URINE CULTURE

## 2021-02-23 LAB — CBC WITH DIFFERENTIAL/PLATELET
Abs Immature Granulocytes: 0 10*3/uL (ref 0.00–0.07)
Basophils Absolute: 0 10*3/uL (ref 0.0–0.1)
Basophils Relative: 0 %
Eosinophils Absolute: 0 10*3/uL (ref 0.0–0.5)
Eosinophils Relative: 0 %
HCT: 27.9 % — ABNORMAL LOW (ref 36.0–46.0)
Hemoglobin: 9.1 g/dL — ABNORMAL LOW (ref 12.0–15.0)
Lymphocytes Relative: 5 %
Lymphs Abs: 0.3 10*3/uL — ABNORMAL LOW (ref 0.7–4.0)
MCH: 25.6 pg — ABNORMAL LOW (ref 26.0–34.0)
MCHC: 32.6 g/dL (ref 30.0–36.0)
MCV: 78.6 fL — ABNORMAL LOW (ref 80.0–100.0)
Monocytes Absolute: 0 10*3/uL — ABNORMAL LOW (ref 0.1–1.0)
Monocytes Relative: 0 %
Neutro Abs: 6.3 10*3/uL (ref 1.7–7.7)
Neutrophils Relative %: 95 %
Platelets: UNDETERMINED 10*3/uL (ref 150–400)
RBC: 3.55 MIL/uL — ABNORMAL LOW (ref 3.87–5.11)
RDW: 14.2 % (ref 11.5–15.5)
WBC: 6.6 10*3/uL (ref 4.0–10.5)
nRBC: 0 % (ref 0.0–0.2)

## 2021-02-23 LAB — GROUP A STREP BY PCR: Group A Strep by PCR: NOT DETECTED

## 2021-02-23 LAB — MAGNESIUM: Magnesium: 2 mg/dL (ref 1.7–2.4)

## 2021-02-23 LAB — ECHOCARDIOGRAM COMPLETE
Area-P 1/2: 5.75 cm2
Height: 64.5 in
S' Lateral: 3 cm
Weight: 2144 oz

## 2021-02-23 LAB — PHOSPHORUS: Phosphorus: 3.7 mg/dL (ref 2.5–4.6)

## 2021-02-23 LAB — CBG MONITORING, ED: Glucose-Capillary: 163 mg/dL — ABNORMAL HIGH (ref 70–99)

## 2021-02-23 LAB — CK: Total CK: 6127 U/L — ABNORMAL HIGH (ref 38–234)

## 2021-02-23 MED ORDER — LACTATED RINGERS IV SOLN: INTRAVENOUS | Status: DC

## 2021-02-23 MED ORDER — LIP MEDEX EX OINT
TOPICAL_OINTMENT | CUTANEOUS | Status: DC | PRN
  Administered 2021-02-23: 1 via TOPICAL
  Filled 2021-02-23 (×2): qty 7

## 2021-02-23 MED ORDER — CHLORHEXIDINE GLUCONATE CLOTH 2 % EX PADS
6.0000 | MEDICATED_PAD | Freq: Every day | CUTANEOUS | Status: DC
  Administered 2021-02-23 – 2021-02-26 (×4): 6 via TOPICAL

## 2021-02-23 MED ORDER — VANCOMYCIN HCL 750 MG/150ML IV SOLN
750.0000 mg | Freq: Two times a day (BID) | INTRAVENOUS | Status: DC
  Administered 2021-02-23 – 2021-02-24 (×3): 750 mg via INTRAVENOUS
  Filled 2021-02-23 (×5): qty 150

## 2021-02-23 MED ORDER — DEXAMETHASONE SODIUM PHOSPHATE 10 MG/ML IJ SOLN
10.0000 mg | Freq: Four times a day (QID) | INTRAMUSCULAR | Status: DC
  Administered 2021-02-23 – 2021-02-27 (×16): 10 mg via INTRAVENOUS
  Filled 2021-02-23 (×16): qty 1

## 2021-02-23 NOTE — ED Notes (Signed)
Rectal temp continues to drop at this time despite being on the bair hugger and receiving warmed fluids.   Dr. Jamse Arn aware

## 2021-02-23 NOTE — ED Notes (Signed)
Pt appears agitated. Removed all monitoring wirse (cardiac, bp cuff, Katy with ET). IV also noted to have been dislodged from L AC. IV intact to R AC. Cardiac monitoring wires replaced. Warm blanket provided

## 2021-02-23 NOTE — Progress Notes (Addendum)
PROGRESS NOTE    Erin French  KZS:[id number]  DOB: Oct 13, 1995  DOA: 02/22/2021 PCP: Karleen Dolphin, MD (Inactive) Outpatient Specialists:   Hospital course:  26 year old female with history of chlamydia was admitted last night with altered mental status, fevers chills and lower abdominal pain.  Patient had extensive work-up as she was unable to provide a history.  Work-up was notable for CSF consistent with likely viral meningitis and possible UTI.  Also noted to have UDS positive only for THC, CPK 6000 and normal lactate.  COVID and influenza were negative.  Head CT was negative.  Patient was started on broad-spectrum antibiotics including ceftriaxone, acyclovir to cover meningitis as well as vancomycin and Flagyl.   Subjective:  When I initially saw the patient she was unarousable.  GCS was 8.  She withdrew to pain and seem to have pain over her entire body including extremities shoulders knees and abdomen.  When I reevaluated the patient 6 hours later, she was able to be aroused and while she was initially quite confused, she seemed to regain a more normal mental status with time.  Patient denies any unusual drug use.  Does not think she has had a recent illness.  Does states she has a 12-year-old child named Erin French with whom she lives.  Patient was not sure where Erin French was.  Objective: Vitals:   02/23/21 1415 02/23/21 1530 02/23/21 1545 02/23/21 1600  BP: 117/75 120/82 118/74 112/70  Pulse: 81 82 85 83  Resp: (!) 21 (!) 45 (!) 31 (!) 28  Temp:  (!) 93.2 F (34 C) (!) 93.6 F (34.2 C) (!) 93.9 F (34.4 C)  TempSrc:      SpO2: 100% 100% 100% 100%  Weight:      Height:        Intake/Output Summary (Last 24 hours) at 02/23/2021 1623 Last data filed at 02/23/2021 1515 Gross per 24 hour  Intake 875 ml  Output 2800 ml  Net -1925 ml   Filed Weights   02/22/21 1323  Weight: 60.8 kg     Exam:  General: Thin female with Kussmaul breathing and dried blood on her mouth lying  flat in bed with tachypnea. Eyes: sclera anicteric, conjuctiva with significant injection bilaterally  CVS: S1-S2, tachycardic, 2/6 murmur. Respiratory: Good air entry bilaterally with transmitted upper respiratory sounds  GI: NABS, soft, initially tender to palpation as was the rest of her body however on recheck patient had no tenderness to light or deep palpation. LE: No edema.  Neuro: Initially GCS was 8, GCS at last check was 11, she was able to stay awake and keep her eyes open for longer periods of time and was able to answer questions although remained confused.  Assessment & Plan:   26 year old female was admitted with fever and altered mental status with work-up suggestive of possible viral meningitis and or UTI.  Altered mental status with fever followed by hypothermia/severe sepsis Over the course of the day patient's mental status seems to be improving, initially unable to be aroused and but that course of this afternoon was answering questions with few words although remained confused. I suspect this is a viral meningitis, will continue ceftriaxone and acyclovir. Patient has been placed on empiric vancomycin which be continued for the next 72 hours UDS is positive only for THC and patient denies other substance use  Hypothermia Patient's temperature dropped from 101.7 down to 92.6 despite use of warmed fluids and Bair hugger blanket. I requested PCCM to see  the patient and they recommended continuing present care with possible addition of bladder lavage if temperature continues to fall. Temp Foley was placed per their recommendation. TSH is pending  R/O Addison's disease Given low sodium and higher potassium, there is concern for possible adrenal insufficiency However patient is already been started on Decadron so cannot test accurately Will continue dexamethasone 10 every 6 hours per PCCM recommendation Follow sodium and potassium  AKI in setting of mild elevation of  CPK Resolved with hydration Follow CPK  Safety of patient's child Very much appreciate ED case manager who was able to ascertain that patient's child has been picked up by a family member and is being cared for safely as per her discussion with officer.  Axillary adenopathy Patient can have diagnostic mammogram if warranted as an outpatient    DVT prophylaxis: Lovenox Code Status: Full Family Communication: Multiple attempts to call spouse was unsuccessful, will try to get a contact phone number from Officer who did safety check Disposition Plan:   Patient is from: Home  Anticipated Discharge Location: Home  Barriers to Discharge: Acutely ill and septic  Is patient medically stable for Discharge: No   Scheduled Meds:  dexamethasone (DECADRON) injection  10 mg Intravenous Q6H   enoxaparin (LOVENOX) injection  40 mg Subcutaneous Q24H   sodium chloride flush  3 mL Intravenous Q12H   Continuous Infusions:  acyclovir Stopped (02/23/21 1434)   cefTRIAXone (ROCEPHIN)  IV Stopped (02/23/21 1006)   lactated ringers 150 mL/hr at 02/23/21 1325   vancomycin      Data Reviewed:  Basic Metabolic Panel: Recent Labs  Lab 02/22/21 1346 02/22/21 1447 02/23/21 0741 02/23/21 0851  NA 127* 130*   130* 131* 133*  K 4.3 4.5   4.5 5.2* 4.7  CL 97* 98 101  --   CO2 20*  --  21*  --   GLUCOSE 106* 97 116*  --   BUN 19 21* 14  --   CREATININE 1.40* 1.20* 1.03*  --   CALCIUM 8.3*  --  8.2*  --   MG  --   --  2.0  --   PHOS  --   --  3.7  --     CBC: Recent Labs  Lab 02/22/21 1346 02/22/21 1447 02/23/21 0741 02/23/21 0851  WBC 4.9  --  6.6  --   NEUTROABS 4.3  --  6.3  --   HGB 11.2* 12.2   12.9 9.1* 10.5*  HCT 33.2* 36.0   38.0 27.9* 31.0*  MCV 77.6*  --  78.6*  --   PLT 154  --  PLATELET CLUMPS NOTED ON SMEAR, UNABLE TO ESTIMATE  --     Studies: CT Head Wo Contrast  Result Date: 02/22/2021 CLINICAL DATA:  Mental status change, unknown cause. Seen here last week had  viral infection and prescribed antibiotics. Unsure if she took it. Family friend came to check on her and found her AMS and slurring words. Fever tachy and tachypneic. EXAM: CT HEAD WITHOUT CONTRAST TECHNIQUE: Contiguous axial images were obtained from the base of the skull through the vertex without intravenous contrast. COMPARISON:  None. FINDINGS: Brain: No evidence of large-territorial acute infarction. No parenchymal hemorrhage. No mass lesion. No extra-axial collection. No mass effect or midline shift. No hydrocephalus. Basilar cisterns are patent. Vascular: No hyperdense vessel. Skull: No acute fracture or focal lesion. Sinuses/Orbits: Paranasal sinuses and mastoid air cells are clear. The orbits are unremarkable. Other: None. IMPRESSION: No acute intracranial  abnormality. Electronically Signed   By: Iven Finn M.D.   On: 02/22/2021 17:43   CT CHEST ABDOMEN PELVIS W CONTRAST  Result Date: 02/22/2021 CLINICAL DATA:  Respiratory illness, nondiagnostic xray. Seen here last week had viral infection and prescribed antibiotics. Unsure if she took it. Family friend came to check on her and found her AMS and slurring words. Fever tachy and tachypneic. EXAM: CT CHEST, ABDOMEN, AND PELVIS WITH CONTRAST TECHNIQUE: Multidetector CT imaging of the chest, abdomen and pelvis was performed following the standard protocol during bolus administration of intravenous contrast. CONTRAST:  163mL OMNIPAQUE IOHEXOL 300 MG/ML  SOLN COMPARISON:  None. FINDINGS: CT CHEST FINDINGS Cardiovascular: Normal heart size. No significant pericardial effusion. The thoracic aorta is normal in caliber. No atherosclerotic plaque of the thoracic aorta. No coronary artery calcifications. Mediastinum/Nodes: No enlarged mediastinal or hilar lymph nodes. Borderline enlarged right axillary lymph nodes. No definite left lymphadenopathy. Thyroid gland, trachea, and esophagus demonstrate no significant findings. Lungs/Pleura: No focal  consolidation. No pulmonary nodule. No pulmonary mass. No pleural effusion. No pneumothorax. Musculoskeletal: No chest wall abnormality. No suspicious lytic or blastic osseous lesions. No acute displaced fracture. CT ABDOMEN PELVIS FINDINGS Hepatobiliary: No focal liver abnormality. No gallstones, gallbladder wall thickening, or pericholecystic fluid. No biliary dilatation. Pancreas: No focal lesion. Normal pancreatic contour. No surrounding inflammatory changes. No main pancreatic ductal dilatation. Spleen: Normal in size without focal abnormality. A splenule is noted. Adrenals/Urinary Tract: No adrenal nodule bilaterally. Bilateral kidneys enhance symmetrically. Mild bilateral hydroureteronephrosis. Punctate left nephrolithiasis. No right nephrolithiasis. No ureterolithiasis bilaterally. The urinary bladder is distended with urine and otherwise unremarkable. On delayed imaging, there is no urothelial wall thickening and there are no filling defects in the opacified portions of the bilateral collecting systems or ureters. Stomach/Bowel: Stomach is within normal limits. No evidence of bowel wall thickening or dilatation. Medialized mobile cecum. Appendix appears normal. Vascular/Lymphatic: No abdominal aorta or iliac aneurysm. No abdominal, pelvic, or inguinal lymphadenopathy. Reproductive: Calcified anterior uterine wall fibroid. Otherwise uterus and bilateral adnexa are unremarkable. Other: No intraperitoneal free fluid. No intraperitoneal free gas. No organized fluid collection. Musculoskeletal: No abdominal wall hernia or abnormality. No suspicious lytic or blastic osseous lesions. No acute displaced fracture. IMPRESSION: 1. Borderline enlarged right axillary lymph nodes. Correlate with diagnostic mammography. 2. Mild bilateral hydroureteronephrosis. Associated urinary bladder distention with urine. This may reflect the residual of a recently passed calculus or reflect chronic changes of obstructive uropathy or  reflux. 3. Nonobstructive punctate left nephrolithiasis. 4. Likely degenerative uterine fibroid. Electronically Signed   By: Iven Finn M.D.   On: 02/22/2021 18:11   DG Chest Port 1 View  Result Date: 02/22/2021 CLINICAL DATA:  Sepsis EXAM: PORTABLE CHEST 1 VIEW COMPARISON:  None. FINDINGS: The heart size and mediastinal contours are within normal limits. Both lungs are clear. There is curvature of spine. IMPRESSION: No active disease. Electronically Signed   By: Abelardo Diesel M.D.   On: 02/22/2021 15:26   EEG adult  Result Date: 02/23/2021 Lora Havens, MD     02/23/2021  7:26 AM Patient Name: Aaira Oestreicher MRN: [id number] Epilepsy Attending: Lora Havens Referring Physician/Provider: Dr Mitzi Hansen Date: 02/23/2021 Duration: 32.38 mins Patient history: 25yo F with ams. EEG to evaluate for seizure Level of alertness: Awake AEDs during EEG study: None Technical aspects: This EEG study was done with scalp electrodes positioned according to the 10-20 International system of electrode placement. Electrical activity was acquired at a sampling rate of 500Hz  and reviewed  with a high frequency filter of 70Hz  and a low frequency filter of 1Hz . EEG data were recorded continuously and digitally stored. Description: EEG showed continuous generalized 3 to 6 Hz theta-delta slowing. Hyperventilation and photic stimulation were not performed.   Of note, study was technically difficult due to significant myogenic artifact. ABNORMALITY - Continuous slow, generalized IMPRESSION: This technically difficult study is suggestive of moderate diffuse encephalopathy, nonspecific etiology. No seizures or epileptiform discharges were seen throughout the recording. Lora Havens   ECHOCARDIOGRAM COMPLETE  Result Date: 02/23/2021    ECHOCARDIOGRAM REPORT   Patient Name:   [facility] Date of Exam: 02/23/2021 Medical Rec #:  [id number]      Height:       64.5 in Accession #:    [id number]     Weight:       134.0 lb Date  of Birth:  1995/10/16      BSA:          1.659 m Patient Age:    25 years       BP:           124/93 mmHg Patient Gender: F              HR:           80 bpm. Exam Location:  Inpatient Procedure: 2D Echo, 3D Echo, Color Doppler and Cardiac Doppler STAT ECHO Indications:    Pericardial effusion I31.3  History:        Patient has no prior history of Echocardiogram examinations.                 Fever, chills, aches, lower abdominal pain. Severe sepsis, acute                 encephalopathy, acute kidney injury.  Sonographer:    Darlina Sicilian RDCS Referring Phys: [id number] [place]  1. Left ventricular ejection fraction, by estimation, is 55 to 60%. The left ventricle has normal function. The left ventricle has no regional wall motion abnormalities. There is moderate left ventricular hypertrophy of the lateral segment. Left ventricular diastolic parameters are consistent with Grade I diastolic dysfunction (impaired relaxation).  2. Right ventricular systolic function is normal. The right ventricular size is normal. There is normal pulmonary artery systolic pressure.  3. The pericardial effusion is circumferential. There is no evidence of cardiac tamponade.  4. The mitral valve is normal in structure. Trivial mitral valve regurgitation. No evidence of mitral stenosis.  5. The aortic valve is tricuspid. Aortic valve regurgitation is not visualized. No aortic stenosis is present.  6. The inferior vena cava is normal in size with greater than 50% respiratory variability, suggesting right atrial pressure of 3 mmHg. FINDINGS  Left Ventricle: Left ventricular ejection fraction, by estimation, is 55 to 60%. The left ventricle has normal function. The left ventricle has no regional wall motion abnormalities. The left ventricular internal cavity size was normal in size. There is  moderate left ventricular hypertrophy of the lateral segment. Left ventricular diastolic parameters are consistent with Grade I  diastolic dysfunction (impaired relaxation). Indeterminate filling pressures. Right Ventricle: The right ventricular size is normal. No increase in right ventricular wall thickness. Right ventricular systolic function is normal. There is normal pulmonary artery systolic pressure. The tricuspid regurgitant velocity is 1.99 m/s, and  with an assumed right atrial pressure of 3 mmHg, the estimated right ventricular systolic pressure is 68.1 mmHg. Left Atrium: Left atrial size was normal in size.  Right Atrium: Right atrial size was normal in size. Pericardium: Trivial pericardial effusion is present. The pericardial effusion is circumferential. There is no evidence of cardiac tamponade. Mitral Valve: The mitral valve is normal in structure. Trivial mitral valve regurgitation. No evidence of mitral valve stenosis. Tricuspid Valve: The tricuspid valve is normal in structure. Tricuspid valve regurgitation is trivial. No evidence of tricuspid stenosis. Aortic Valve: The aortic valve is tricuspid. Aortic valve regurgitation is not visualized. No aortic stenosis is present. Pulmonic Valve: The pulmonic valve was normal in structure. Pulmonic valve regurgitation is trivial. No evidence of pulmonic stenosis. Aorta: The aortic root is normal in size and structure. Venous: The inferior vena cava is normal in size with greater than 50% respiratory variability, suggesting right atrial pressure of 3 mmHg. IAS/Shunts: No atrial level shunt detected by color flow Doppler.  LEFT VENTRICLE PLAX 2D LVIDd:         4.20 cm   Diastology LVIDs:         3.00 cm   LV e' medial:    8.27 cm/s LV PW:         1.20 cm   LV E/e' medial:  9.4 LV IVS:        1.20 cm   LV e' lateral:   7.40 cm/s LVOT diam:     2.20 cm   LV E/e' lateral: 10.6 LV SV:         56 LV SV Index:   34 LVOT Area:     3.80 cm  RIGHT VENTRICLE RV S prime:     11.70 cm/s TAPSE (M-mode): 2.1 cm LEFT ATRIUM             Index        RIGHT ATRIUM          Index LA diam:        2.45 cm  1.48 cm/m   RA Area:     7.56 cm LA Vol (A2C):   32.2 ml 19.41 ml/m  RA Volume:   11.20 ml 6.75 ml/m LA Vol (A4C):   27.6 ml 16.64 ml/m LA Biplane Vol: 30.7 ml 18.51 ml/m  AORTIC VALVE LVOT Vmax:   73.40 cm/s LVOT Vmean:  45.800 cm/s LVOT VTI:    0.147 m  AORTA Ao Root diam: 2.90 cm Ao Asc diam:  3.00 cm MITRAL VALVE               TRICUSPID VALVE MV Area (PHT): 5.75 cm    TR Peak grad:   15.8 mmHg MV Decel Time: 132 msec    TR Vmax:        199.00 cm/s MV E velocity: 78.10 cm/s MV A velocity: 82.80 cm/s  SHUNTS MV E/A ratio:  0.94        Systemic VTI:  0.15 m                            Systemic Diam: 2.20 cm Skeet Latch MD Electronically signed by Skeet Latch MD Signature Date/Time: 02/23/2021/4:05:03 PM    Final     Principal Problem:   Severe sepsis ([place]) Active Problems:   AKI (acute kidney injury) ([place])   Acute encephalopathy   Hyponatremia   Elevated transaminase level   Axillary lymphadenopathy     Jaelene Garciagarcia Derek Jack, [facility]  If 7PM-7AM, please contact night-coverage [email or web address]   LOS: 1 day

## 2021-02-23 NOTE — ED Notes (Addendum)
Dr. Jamse Arn aware that current rectal temp is 93.1 despite being on bair hugger and receiving warm fluids

## 2021-02-23 NOTE — Consult Note (Signed)
NAME:  Kanija Remmel, MRN:  [id number], DOB:  August 26, 1995, LOS: 1 ADMISSION DATE:  02/22/2021, CONSULTATION DATE:  02/23/21 REFERRING MD:  Dr. Jamse Arn, CHIEF COMPLAINT: AMS    History of Present Illness:  26 y/o F who presented to [facility] ER on 1/1 with reports of altered mental status.   She was seen in the ER on 12/24 with body aches, fever to 103, chills and cough for 4-5 days. LMP documented at that time of 02/02/21.  She also reported nausea, dry heaves, diarrhea, decreased intake and abdominal pain.  She was treated for viral illness and discharged.   She returns to the ER on 1/1 via EMS after being found at home altered, slurring words by a friend.  She was febrile and breathing fast.  On initial evaluation in the ER, she was profoundly altered, disoriented.  She was tender to palpation in abdomen.  Her temperature ws 102.9. CT of the abdomen/pelvis showed borderline enlarged right axillary nodes, mild bilateral hydroureteronephrosis associated bladder distention with urine, non-obstructive punctate left nephrolithiasis.  CT head was negative. Labs showed CK 6,127,  Na 131, K 5.2, CO2 21, glucose 116, BUN 14, Sr Cr 1.03, calcium 8.2, phos 2.7, mag 2.0, albumin 2.9, ast 120, WBC 6.6, Hgb 9.1, MCV 78 and clumped platelets.  No shift on differential.  Given AMS and no clear etiology, LP was warranted but EDP unable to reach family for procedure consent.  Ativan was given and LP performed.  CSF showed <30 glucose, WBC 19, neutrophils 77, lymph 21, total protein 120, concerning for possible viral meningitis. She remained persistently hypothermic, bair hugger applied.   PCCM consulted for evaluation of encephalopathy & hypothermia.    Pertinent  Medical History  Chlamydia   Significant Hospital Events: Including procedures, antibiotic start and stop dates in addition to other pertinent events   12/31 admit with AMS, PCCM consulted   Interim History / Subjective:  As above  Objective   Blood  pressure 117/75, pulse 81, temperature (!) 92.6 F (33.7 C), temperature source Rectal, resp. rate (!) 21, height 5' 4.5" (1.638 m), weight 60.8 kg, SpO2 100 %, currently breastfeeding.        Intake/Output Summary (Last 24 hours) at 02/23/2021 1518 Last data filed at 02/23/2021 1515 Gross per 24 hour  Intake 875 ml  Output 2800 ml  Net -1925 ml   Filed Weights   02/22/21 1323  Weight: 60.8 kg    Examination: General: thin adult female lying in bed in NAD HENT: MM pink/very dry, no jvd, good dentition, anicteric, no nuchal rigidity  Lungs: non-labored at rest on RA, kussmal respirations, lungs clear bilaterally  Cardiovascular: s1s2 rrr, no m/r/g Abdomen: non-distended, bsx4 active  Extremities: warm/dry, no edema, bair hugger in place  Neuro: awakens to voice, mumbles but appropriate with yes /no answers, confused on location/events   Resolved Hospital Problem list     Assessment & Plan:   Acute Metabolic Encephalopathy  Suspected Viral Encephalitis   CSF fluid concerning for possible viral meningitis.  Recent viral illness over Christmas.  -admit per [facility]  -monitor mental status, appears to be improving per prior exams  -IVF -continue empiric meningitis coverage with abx and antivirals -assess EEG for possible seizure -s/p steroids in ER  -limit further sedating medications (needed ativan for LP) -frequent reorientation   Hypothermia  -temp foley for accurate monitoring  -active rewarming measures  -could consider bladder lavage if unable to maintain temperature   AKI  Hyponatremia  -  Trend BMP / urinary output -Replace electrolytes as indicated -Avoid nephrotoxic agents, ensure adequate renal perfusion -IVF per [facility]   Anemia  Note CT findings of old fibroid  -trend CBC  -transfuse for Hgb <7%   Best Practice (right click and "Reselect all SmartList Selections" daily)  Per Primary   Labs   CBC: Recent Labs  Lab 02/22/21 1346 02/22/21 1447 02/23/21 0741  02/23/21 0851  WBC 4.9  --  6.6  --   NEUTROABS 4.3  --  6.3  --   HGB 11.2* 12.2   12.9 9.1* 10.5*  HCT 33.2* 36.0   38.0 27.9* 31.0*  MCV 77.6*  --  78.6*  --   PLT 154  --  PLATELET CLUMPS NOTED ON SMEAR, UNABLE TO ESTIMATE  --     Basic Metabolic Panel: Recent Labs  Lab 02/22/21 1346 02/22/21 1447 02/23/21 0741 02/23/21 0851  NA 127* 130*   130* 131* 133*  K 4.3 4.5   4.5 5.2* 4.7  CL 97* 98 101  --   CO2 20*  --  21*  --   GLUCOSE 106* 97 116*  --   BUN 19 21* 14  --   CREATININE 1.40* 1.20* 1.03*  --   CALCIUM 8.3*  --  8.2*  --   MG  --   --  2.0  --   PHOS  --   --  3.7  --    GFR: Estimated Creatinine Clearance: 73.7 mL/min (A) (by C-G formula based on SCr of 1.03 mg/dL (H)). Recent Labs  Lab 02/22/21 1345 02/22/21 1346 02/22/21 1519 02/23/21 0741  WBC  --  4.9  --  6.6  LATICACIDVEN 1.5  --  1.4  --     Liver Function Tests: Recent Labs  Lab 02/22/21 1346 02/23/21 0741  AST 126* 120*  ALT 21 23  ALKPHOS 27* 25*  BILITOT 1.2 0.6  PROT 7.5 6.4*  ALBUMIN 3.4* 2.9*   Recent Labs  Lab 02/22/21 1446  LIPASE 170*   No results for input(s): AMMONIA in the last 168 hours.  ABG    Component Value Date/Time   PHART 7.459 (H) 02/23/2021 0851   PCO2ART 33.3 02/23/2021 0851   PO2ART 147 (H) 02/23/2021 0851   HCO3 23.7 02/23/2021 0851   TCO2 25 02/23/2021 0851   ACIDBASEDEF 2.0 02/22/2021 1447   O2SAT 99.0 02/23/2021 0851     Coagulation Profile: Recent Labs  Lab 02/22/21 1346  INR 1.2    Cardiac Enzymes: Recent Labs  Lab 02/22/21 2333  CKTOTAL 6,127*    HbA1C: No results found for: HGBA1C  CBG: Recent Labs  Lab 02/23/21 1429  GLUCAP 163*    Review of Systems:   Unable to complete as patient is altered.  Information obtained from staff at bedside, & prior medical documentation.   Past Medical History:  She,  has a past medical history of Chlamydia (4/12).   Surgical History:   Past Surgical History:  Procedure  Laterality Date   CESAREAN SECTION N/A 11/17/2018   Procedure: CESAREAN SECTION;  Surgeon: Aletha Halim, MD;  Location: [facility] LD ORS;  Service: Obstetrics;  Laterality: N/A;   INDUCED ABORTION       Social History:   reports that she has never smoked. She has never used smokeless tobacco. She reports that she does not currently use alcohol. She reports that she does not use drugs.   Family History:  Her family history includes Hypertension in her father  and maternal grandmother; Schizophrenia in her mother.   Allergies No Known Allergies   Home Medications  Prior to Admission medications   Medication Sig Start Date End Date Taking? Authorizing Provider  ferrous sulfate 325 (65 FE) MG tablet Take 1 tablet (325 mg total) by mouth 2 (two) times daily with a meal. Patient not taking: Reported on 12/06/2018 11/19/18 12/19/18  Lattie Haw, MD  metroNIDAZOLE (FLAGYL) 500 MG tablet Take 1 tablet (500 mg total) by mouth 2 (two) times daily. Patient not taking: Reported on 02/22/2021 02/15/20   Griffin Basil, MD         Noe Gens, MSN, APRN, NP-C, AGACNP-BC [facility] 02/23/2021, 3:18 PM   Please see [email or web address] for pager details.   From 7A-7P if no response, please call [phone] After hours, please call ELink [phone]

## 2021-02-23 NOTE — Procedures (Signed)
Patient Name: Erin French  MRN: [id number]  Epilepsy Attending: Lora Havens  Referring Physician/Provider: Dr Mitzi Hansen Date: 02/23/2021 Duration: 32.38 mins  Patient history: 25yo F with ams. EEG to evaluate for seizure  Level of alertness: Awake  AEDs during EEG study: None  Technical aspects: This EEG study was done with scalp electrodes positioned according to the 10-20 International system of electrode placement. Electrical activity was acquired at a sampling rate of 500Hz  and reviewed with a high frequency filter of 70Hz  and a low frequency filter of 1Hz . EEG data were recorded continuously and digitally stored.   Description: EEG showed continuous generalized 3 to 6 Hz theta-delta slowing. Hyperventilation and photic stimulation were not performed.     Of note, study was technically difficult due to significant myogenic artifact.   ABNORMALITY - Continuous slow, generalized  IMPRESSION: This technically difficult study is suggestive of moderate diffuse encephalopathy, nonspecific etiology. No seizures or epileptiform discharges were seen throughout the recording.  Erin French

## 2021-02-23 NOTE — ED Notes (Signed)
EKG done and in the chart due to EKG changes from yesterday. Dr. Jamse Arn aware.

## 2021-02-23 NOTE — ED Notes (Signed)
Bair hugger placed on pt at this time for rectal temp of 94.6

## 2021-02-23 NOTE — Procedures (Signed)
Patient Name: Erin French  MRN: [id number]  Epilepsy Attending: Lora Havens  Referring Physician/Provider: Donita Brooks, NP Date: 02/23/2021 Duration: 22.13 mins  Patient history: 25yo F with ams. EEG to evaluate for seizure  Level of alertness:lethargic   AEDs during EEG study: None  Technical aspects: This EEG study was done with scalp electrodes positioned according to the 10-20 International system of electrode placement. Electrical activity was acquired at a sampling rate of 500Hz  and reviewed with a high frequency filter of 70Hz  and a low frequency filter of 1Hz . EEG data were recorded continuously and digitally stored.   Description: EEG showed continuous generalized 3 to 6 Hz theta-delta slowing admixed with frontocentral 112-14hz  beta activity Hyperventilation and photic stimulation were not performed.     Of note, study was technically difficult due to significant sweat artifact.   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 No seizures or epileptiform discharges were seen throughout the recording.  Erin French Erin French

## 2021-02-23 NOTE — Care Management (Signed)
ED RN Care Manager received callback from officer Larkin Ina [phone] that they were alerted yesterday and contacted family member  emergency contact Vilinda Flake [phone] who picked up the child.  Updated MD and ED Nsg staff.

## 2021-02-23 NOTE — ED Notes (Signed)
Pts rectal temp decreased to 93.6 despite being on the highest setting on bair hugger. Dr. Jamse Arn aware. Will continue to monitor and reassess temp in 1 hour

## 2021-02-23 NOTE — ED Notes (Signed)
CCM request temp sensing foley at this time

## 2021-02-23 NOTE — Progress Notes (Signed)
EEG complete - results pending 

## 2021-02-23 NOTE — Progress Notes (Signed)
°  Echocardiogram 2D Echocardiogram has been performed.  Darlina Sicilian M 02/23/2021, 3:51 PM

## 2021-02-23 NOTE — Care Management (Signed)
ED CM contacted non emergency wellness check initiated awaiting call back.

## 2021-02-23 NOTE — ED Notes (Signed)
Per verbal order from Dr. Jamse Arn, warmed IV fluids started on pt at this time

## 2021-02-23 NOTE — ED Notes (Signed)
Attempted to call pts significant other in the chart no answer

## 2021-02-23 NOTE — ED Notes (Signed)
ED TO INPATIENT HANDOFF REPORT  ED Nurse Name and Phone #: [place] [phone]  S Name/Age/Gender Erin French 25 y.o. female Room/Bed: 027C/027C  Code Status   Code Status: Full Code  Home/SNF/Other Home Patient oriented to: self Is this baseline? No   Triage Complete: Triage complete  Chief Complaint Severe sepsis ([place]) [A41.9, R65.20]  Triage Note Seen here last week had viral infection and prescribed antibiotics. Unsure if she took it.  Family friend came to check on her and found her AMS and slurring words.  Fever tachy and tachypneic.  No one has seen her since last Saturday.  Patient has blood crusted around lips.    Allergies No Known Allergies  Level of Care/Admitting Diagnosis ED Disposition     ED Disposition  Admit   Condition  --   Comment  Hospital Area: [facility] [[id number]]  Level of Care: Progressive [102]  Admit to Progressive based on following criteria: MULTISYSTEM THREATS such as stable sepsis, metabolic/electrolyte imbalance with or without encephalopathy that is responding to early treatment.  May admit patient to [facility] or [facility] if equivalent level of care is available:: Yes  Covid Evaluation: Confirmed COVID Negative  Diagnosis: Severe sepsis [facility]) [[id number]]  Admitting Physician: Vianne Bulls [[id number]]  Attending Physician: Vianne Bulls [[id number]]  Estimated length of stay: past midnight tomorrow  Certification:: I certify this patient will need inpatient services for at least 2 midnights          B Medical/Surgery History Past Medical History:  Diagnosis Date   Chlamydia 4/12   Past Surgical History:  Procedure Laterality Date   CESAREAN SECTION N/A 11/17/2018   Procedure: CESAREAN SECTION;  Surgeon: Aletha Halim, MD;  Location: [facility] LD ORS;  Service: Obstetrics;  Laterality: N/A;   INDUCED ABORTION       A IV Location/Drains/Wounds Patient Lines/Drains/Airways Status     Active  Line/Drains/Airways     Name Placement date Placement time Site Days   Peripheral IV 02/23/21 20 G Left Antecubital 02/23/21  0729  Antecubital  less than 1   Peripheral IV 02/23/21 20 G Anterior;Right Forearm 02/23/21  0825  Forearm  less than 1   Urethral Catheter mckaley Temperature probe 16 Fr. 02/23/21  1514  Temperature probe  less than 1   Incision (Closed) 11/17/18 Abdomen Other (Comment) 11/17/18  1857  -- 829   Incision (Closed) 11/17/18 Vagina Other (Comment) 11/17/18  1857  -- 829            Intake/Output Last 24 hours  Intake/Output Summary (Last 24 hours) at 02/23/2021 1701 Last data filed at 02/23/2021 1515 Gross per 24 hour  Intake 875 ml  Output 2400 ml  Net -1525 ml    Labs/Imaging Results for orders placed or performed during the hospital encounter of 02/22/21 (from the past 48 hour(s))  Urinalysis, Routine w reflex microscopic     Status: Abnormal   Collection Time: 02/22/21  1:19 PM  Result Value Ref Range   Color, Urine AMBER (A) YELLOW    Comment: BIOCHEMICALS MAY BE AFFECTED BY COLOR   APPearance HAZY (A) CLEAR   Specific Gravity, Urine 1.024 1.005 - 1.030   pH 6.0 5.0 - 8.0   Glucose, UA NEGATIVE NEGATIVE mg/dL   Hgb urine dipstick LARGE (A) NEGATIVE   Bilirubin Urine NEGATIVE NEGATIVE   Ketones, ur 20 (A) NEGATIVE mg/dL   Protein, ur 100 (A) NEGATIVE mg/dL   Nitrite NEGATIVE NEGATIVE   Leukocytes,Ua  MODERATE (A) NEGATIVE   RBC / HPF 0-5 0 - 5 RBC/hpf   WBC, UA 6-10 0 - 5 WBC/hpf   Bacteria, UA RARE (A) NONE SEEN   Squamous Epithelial / LPF 0-5 0 - 5    Comment: Performed at [facility], [place] [address] [place], [place] [address]  Urine Culture     Status: Abnormal   Collection Time: 02/22/21  1:19 PM   Specimen: In/Out Cath Urine  Result Value Ref Range   Specimen Description IN/OUT CATH URINE    Special Requests      NONE Performed at [facility], [place] [address], [place], [place] [address]    Culture MULTIPLE SPECIES  PRESENT, SUGGEST RECOLLECTION (A)    Report Status 02/23/2021 FINAL   Culture, blood (Routine x 2)     Status: None (Preliminary result)   Collection Time: 02/22/21  1:24 PM   Specimen: BLOOD  Result Value Ref Range   Specimen Description BLOOD SITE NOT SPECIFIED    Special Requests      BOTTLES DRAWN AEROBIC AND ANAEROBIC Blood Culture adequate volume   Culture      NO GROWTH < 24 HOURS Performed at [facility], [place] [address], [place], [place] [address]    Report Status PENDING   Culture, blood (Routine x 2)     Status: None (Preliminary result)   Collection Time: 02/22/21  1:36 PM   Specimen: BLOOD  Result Value Ref Range   Specimen Description BLOOD RIGHT ANTECUBITAL    Special Requests      BOTTLES DRAWN AEROBIC AND ANAEROBIC Blood Culture adequate volume   Culture      NO GROWTH < 24 HOURS Performed at [facility], [place] [address] [place], [place] [address]    Report Status PENDING   Resp Panel by RT-PCR (Flu A&B, Covid) Nasopharyngeal Swab     Status: None   Collection Time: 02/22/21  1:42 PM   Specimen: Nasopharyngeal Swab; Nasopharyngeal(NP) swabs in vial transport medium  Result Value Ref Range   SARS Coronavirus 2 by RT PCR NEGATIVE NEGATIVE    Comment: (NOTE) SARS-CoV-2 target nucleic acids are NOT DETECTED.  The SARS-CoV-2 RNA is generally detectable in upper respiratory specimens during the acute phase of infection. The lowest concentration of SARS-CoV-2 viral copies this assay can detect is 138 copies/mL. A negative result does not preclude SARS-Cov-2 infection and should not be used as the sole basis for treatment or other patient management decisions. A negative result may occur with  improper specimen collection/handling, submission of specimen other than nasopharyngeal swab, presence of viral mutation(s) within the areas targeted by this assay, and inadequate number of viral copies(<138 copies/mL). A negative result must be combined  with clinical observations, patient history, and epidemiological information. The expected result is Negative.  Fact Sheet for Patients:  EntrepreneurPulse.com.au  Fact Sheet for Healthcare Providers:  IncredibleEmployment.be  This test is no t yet approved or cleared by the [place] FDA and  has been authorized for detection and/or diagnosis of SARS-CoV-2 by FDA under an Emergency Use Authorization (EUA). This EUA will remain  in effect (meaning this test can be used) for the duration of the COVID-19 declaration under Section 564(b)(1) of the Act, 21 U.S.C.section 360bbb-3(b)(1), unless the authorization is terminated  or revoked sooner.       Influenza A by PCR NEGATIVE NEGATIVE   Influenza B by PCR NEGATIVE NEGATIVE    Comment: (NOTE)  The Xpert Xpress SARS-CoV-2/FLU/RSV plus assay is intended as an aid in the diagnosis of influenza from Nasopharyngeal swab specimens and should not be used as a sole basis for treatment. Nasal washings and aspirates are unacceptable for Xpert Xpress SARS-CoV-2/FLU/RSV testing.  Fact Sheet for Patients: EntrepreneurPulse.com.au  Fact Sheet for Healthcare Providers: IncredibleEmployment.be  This test is not yet approved or cleared by the [place] FDA and has been authorized for detection and/or diagnosis of SARS-CoV-2 by FDA under an Emergency Use Authorization (EUA). This EUA will remain in effect (meaning this test can be used) for the duration of the COVID-19 declaration under Section 564(b)(1) of the Act, 21 U.S.C. section 360bbb-3(b)(1), unless the authorization is terminated or revoked.  Performed at [facility], [place] [address] [place], [place] [address]   Lactic acid, plasma     Status: None   Collection Time: 02/22/21  1:45 PM  Result Value Ref Range   Lactic Acid, Venous 1.5 0.5 - 1.9 mmol/L    Comment: Performed at [facility] [address], [place], [place] [address]  I-Stat beta hCG blood, ED     Status: None   Collection Time: 02/22/21  1:45 PM  Result Value Ref Range   I-stat hCG, quantitative <5.0 <5 mIU/mL   Comment 3            Comment:   GEST. AGE      CONC.  (mIU/mL)   <=1 WEEK        5 - 50     2 WEEKS       50 - 500     3 WEEKS       100 - 10,000     4 WEEKS     1,000 - 30,000        FEMALE AND NON-PREGNANT FEMALE:     LESS THAN 5 mIU/mL   Comprehensive metabolic panel     Status: Abnormal   Collection Time: 02/22/21  1:46 PM  Result Value Ref Range   Sodium 127 (L) 135 - 145 mmol/L   Potassium 4.3 3.5 - 5.1 mmol/L   Chloride 97 (L) 98 - 111 mmol/L   CO2 20 (L) 22 - 32 mmol/L   Glucose, Bld 106 (H) 70 - 99 mg/dL    Comment: Glucose reference range applies only to samples taken after fasting for at least 8 hours.   BUN 19 6 - 20 mg/dL   Creatinine, Ser 1.40 (H) 0.44 - 1.00 mg/dL   Calcium 8.3 (L) 8.9 - 10.3 mg/dL   Total Protein 7.5 6.5 - 8.1 g/dL   Albumin 3.4 (L) 3.5 - 5.0 g/dL   AST 126 (H) 15 - 41 U/L   ALT 21 0 - 44 U/L   Alkaline Phosphatase 27 (L) 38 - 126 U/L   Total Bilirubin 1.2 0.3 - 1.2 mg/dL   GFR, Estimated 54 (L) >60 mL/min    Comment: (NOTE) Calculated using the CKD-EPI Creatinine Equation (2021)    Anion gap 10 5 - 15    Comment: Performed at [facility], [place] [address], [place], [place] [address]  CBC with Differential     Status: Abnormal   Collection Time: 02/22/21  1:46 PM  Result Value Ref Range   WBC 4.9 4.0 - 10.5 K/uL   RBC 4.28 3.87 - 5.11 MIL/uL   Hemoglobin 11.2 (L) 12.0 - 15.0 g/dL   HCT 33.2 (L) 36.0 - 46.0 %  MCV 77.6 (L) 80.0 - 100.0 fL   MCH 26.2 26.0 - 34.0 pg   MCHC 33.7 30.0 - 36.0 g/dL   RDW 14.0 11.5 - 15.5 %   Platelets 154 150 - 400 K/uL   nRBC 0.0 0.0 - 0.2 %   Neutrophils Relative % 87 %   Neutro Abs 4.3 1.7 - 7.7 K/uL   Lymphocytes Relative 12 %   Lymphs Abs 0.6 (L) 0.7 - 4.0 K/uL   Monocytes Relative 0 %   Monocytes  Absolute 0.0 (L) 0.1 - 1.0 K/uL   Eosinophils Relative 0 %   Eosinophils Absolute 0.0 0.0 - 0.5 K/uL   Basophils Relative 1 %   Basophils Absolute 0.0 0.0 - 0.1 K/uL   nRBC 0 0 /100 WBC   Abs Immature Granulocytes 0.00 0.00 - 0.07 K/uL    Comment: Performed at [facility] [address] [place], [place] [address]  Protime-INR     Status: None   Collection Time: 02/22/21  1:46 PM  Result Value Ref Range   Prothrombin Time 14.8 11.4 - 15.2 seconds   INR 1.2 0.8 - 1.2    Comment: (NOTE) INR goal varies based on device and disease states. Performed at [facility], [place] [address], [place], [place] [address]   APTT     Status: None   Collection Time: 02/22/21  2:46 PM  Result Value Ref Range   aPTT 32 24 - 36 seconds    Comment: Performed at [facility] [address], [place], [place] [address]  Lipase, blood     Status: Abnormal   Collection Time: 02/22/21  2:46 PM  Result Value Ref Range   Lipase 170 (H) 11 - 51 U/L    Comment: Performed at [facility], [place] [address] [place], [place] [address]  I-Stat Chem 8, ED     Status: Abnormal   Collection Time: 02/22/21  2:47 PM  Result Value Ref Range   Sodium 130 (L) 135 - 145 mmol/L   Potassium 4.5 3.5 - 5.1 mmol/L   Chloride 98 98 - 111 mmol/L   BUN 21 (H) 6 - 20 mg/dL   Creatinine, Ser 1.20 (H) 0.44 - 1.00 mg/dL   Glucose, Bld 97 70 - 99 mg/dL    Comment: Glucose reference range applies only to samples taken after fasting for at least 8 hours.   Calcium, Ion 1.03 (L) 1.15 - 1.40 mmol/L   TCO2 21 (L) 22 - 32 mmol/L   Hemoglobin 12.9 12.0 - 15.0 g/dL   HCT 38.0 36.0 - 46.0 %  I-Stat venous blood gas, ED     Status: Abnormal   Collection Time: 02/22/21  2:47 PM  Result Value Ref Range   pH, Ven 7.421 7.250 - 7.430   pCO2, Ven 33.0 (L) 44.0 - 60.0 mmHg   pO2, Ven 50.0 (H) 32.0 - 45.0 mmHg   Bicarbonate 21.5 20.0 - 28.0 mmol/L   TCO2 22 22 - 32 mmol/L   O2 Saturation 86.0 %   Acid-base deficit  2.0 0.0 - 2.0 mmol/L   Sodium 130 (L) 135 - 145 mmol/L   Potassium 4.5 3.5 - 5.1 mmol/L   Calcium, Ion 1.04 (L) 1.15 - 1.40 mmol/L   HCT 36.0 36.0 - 46.0 %   Hemoglobin 12.2 12.0 - 15.0 g/dL   Sample type VENOUS   Rapid HIV screen (HIV 1/2 Ab+Ag)     Status: None   Collection  Time: 02/22/21  2:47 PM  Result Value Ref Range   HIV-1 P24 Antigen - HIV24 NON REACTIVE NON REACTIVE    Comment: (NOTE) Detection of p24 may be inhibited by biotin in the sample, causing false negative results in acute infection.    HIV 1/2 Antibodies NON REACTIVE NON REACTIVE   Interpretation (HIV Ag Ab)      A non reactive test result means that HIV 1 or HIV 2 antibodies and HIV 1 p24 antigen were not detected in the specimen.    Comment: Performed at [facility], [place] [address], [place], [place] [address]  Lactic acid, plasma     Status: None   Collection Time: 02/22/21  3:19 PM  Result Value Ref Range   Lactic Acid, Venous 1.4 0.5 - 1.9 mmol/L    Comment: Performed at [facility] [address] [place], [place] [address]  TSH     Status: None   Collection Time: 02/22/21  5:16 PM  Result Value Ref Range   TSH 0.530 0.350 - 4.500 uIU/mL    Comment: Performed by a 3rd Generation assay with a functional sensitivity of <=0.01 uIU/mL. Performed at [facility], [place] [address] [place], [place] [address]   CSF cell count with differential collection tube #: 1     Status: Abnormal   Collection Time: 02/22/21  6:38 PM  Result Value Ref Range   Tube # 1    Color, CSF COLORLESS COLORLESS   Appearance, CSF CLEAR CLEAR   Supernatant NOT INDICATED    RBC Count, CSF 21 (H) 0 /cu mm   WBC, CSF 15 (HH) 0 - 5 /cu mm    Comment: CRITICAL RESULT CALLED TO, READ BACK BY AND VERIFIED WITH: Abran Richard RN [phone] 2110 BY B WYLIE    Segmented Neutrophils-CSF 85 (H) 0 - 6 %   Lymphs, CSF 15 (L) 40 - 80 %    Comment: Performed at [facility] [address] [place], [place] [address]   CSF cell count with differential collection tube #: 4     Status: Abnormal   Collection Time: 02/22/21  6:38 PM  Result Value Ref Range   Tube # 4    Color, CSF COLORLESS COLORLESS   Appearance, CSF CLEAR CLEAR   Supernatant NOT INDICATED    RBC Count, CSF 7 (H) 0 /cu mm   WBC, CSF 19 (HH) 0 - 5 /cu mm    Comment: CRITICAL RESULT CALLED TO, READ BACK BY AND VERIFIED WITH: RN Abran Richard 02/22/2021 2110 B. WYLIE    Segmented Neutrophils-CSF 77 (H) 0 - 6 %   Lymphs, CSF 21 (L) 40 - 80 %   Monocyte-Macrophage-Spinal Fluid 2 (L) 15 - 45 %    Comment: Performed at [facility] [address] [place], [place] [address]  CSF culture     Status: None (Preliminary result)   Collection Time: 02/22/21  6:38 PM   Specimen: Back; Cerebrospinal Fluid  Result Value Ref Range   Specimen Description BACK    Special Requests NONE    Gram Stain CYTOSPIN SMEAR FEW WBC SEEN NO ORGANISMS SEEN     Culture      NO GROWTH < 12 HOURS Performed at [facility] [address] [place],  [address]    Report Status PENDING   Glucose, CSF     Status: Abnormal   Collection Time: 02/22/21  6:38 PM  Result Value Ref Range   Glucose, CSF 30 (L) 40 - 70 mg/dL    Comment: Performed at [facility] [address] [place], [place] [address]  Protein, CSF     Status: Abnormal   Collection Time: 02/22/21  6:38 PM  Result Value Ref Range   Total  Protein, CSF 120 (H) 15 - 45 mg/dL    Comment: Performed at [facility] [address] [place], [place] [address]  Urine rapid drug screen (hosp performed)     Status: Abnormal   Collection Time: 02/22/21  9:49 PM  Result Value Ref Range   Opiates NONE DETECTED NONE DETECTED   Cocaine NONE DETECTED NONE DETECTED   Benzodiazepines NONE DETECTED NONE DETECTED   Amphetamines NONE DETECTED NONE DETECTED   Tetrahydrocannabinol POSITIVE (A) NONE DETECTED   Barbiturates NONE DETECTED NONE DETECTED    Comment: (NOTE) DRUG  SCREEN FOR MEDICAL PURPOSES ONLY.  IF CONFIRMATION IS NEEDED FOR ANY PURPOSE, NOTIFY LAB WITHIN 5 DAYS.  LOWEST DETECTABLE LIMITS FOR URINE DRUG SCREEN Drug Class                     Cutoff (ng/mL) Amphetamine and metabolites    1000 Barbiturate and metabolites    200 Benzodiazepine                 119 Tricyclics and metabolites     300 Opiates and metabolites        300 Cocaine and metabolites        300 THC                            50 Performed at [facility], [place] [address] [place], [place] [address]   CK     Status: Abnormal   Collection Time: 02/22/21 11:33 PM  Result Value Ref Range   Total CK 6,127 (H) 38 - 234 U/L    Comment: RESULTS CONFIRMED BY MANUAL DILUTION Performed at [facility], [place] [address], [place], [place] [address]   Comprehensive metabolic panel     Status: Abnormal   Collection Time: 02/23/21  7:41 AM  Result Value Ref Range   Sodium 131 (L) 135 - 145 mmol/L   Potassium 5.2 (H) 3.5 - 5.1 mmol/L   Chloride 101 98 - 111 mmol/L   CO2 21 (L) 22 - 32 mmol/L   Glucose, Bld 116 (H) 70 - 99 mg/dL    Comment: Glucose reference range applies only to samples taken after fasting for at least 8 hours.   BUN 14 6 - 20 mg/dL   Creatinine, Ser 1.03 (H) 0.44 - 1.00 mg/dL   Calcium 8.2 (L) 8.9 - 10.3 mg/dL   Total Protein 6.4 (L) 6.5 - 8.1 g/dL   Albumin 2.9 (L) 3.5 - 5.0 g/dL   AST 120 (H) 15 - 41 U/L   ALT 23 0 - 44 U/L   Alkaline Phosphatase 25 (L) 38 - 126 U/L   Total Bilirubin 0.6 0.3 - 1.2 mg/dL   GFR, Estimated >60 >60 mL/min    Comment: (NOTE) Calculated using the CKD-EPI Creatinine Equation (2021)    Anion gap 9 5 - 15    Comment: Performed at [facility], [place] [address] [place],  [address]  CBC WITH DIFFERENTIAL     Status: Abnormal   Collection Time: 02/23/21  7:41 AM  Result Value  Ref Range   WBC 6.6 4.0 - 10.5 K/uL   RBC 3.55 (L) 3.87 - 5.11 MIL/uL   Hemoglobin 9.1 (L) 12.0 - 15.0 g/dL   HCT 27.9 (L) 36.0 -  46.0 %   MCV 78.6 (L) 80.0 - 100.0 fL   MCH 25.6 (L) 26.0 - 34.0 pg   MCHC 32.6 30.0 - 36.0 g/dL   RDW 14.2 11.5 - 15.5 %   Platelets PLATELET CLUMPS NOTED ON SMEAR, UNABLE TO ESTIMATE 150 - 400 K/uL   nRBC 0.0 0.0 - 0.2 %   Neutrophils Relative % 95 %   Neutro Abs 6.3 1.7 - 7.7 K/uL   Lymphocytes Relative 5 %   Lymphs Abs 0.3 (L) 0.7 - 4.0 K/uL   Monocytes Relative 0 %   Monocytes Absolute 0.0 (L) 0.1 - 1.0 K/uL   Eosinophils Relative 0 %   Eosinophils Absolute 0.0 0.0 - 0.5 K/uL   Basophils Relative 0 %   Basophils Absolute 0.0 0.0 - 0.1 K/uL   RBC Morphology MORPHOLOGY UNREMARKABLE    Abs Immature Granulocytes 0.00 0.00 - 0.07 K/uL    Comment: Performed at [facility], [address], [place], [place] [address]  Magnesium     Status: None   Collection Time: 02/23/21  7:41 AM  Result Value Ref Range   Magnesium 2.0 1.7 - 2.4 mg/dL    Comment: Performed at [facility] [address], [place], [place] [address]  Phosphorus     Status: None   Collection Time: 02/23/21  7:41 AM  Result Value Ref Range   Phosphorus 3.7 2.5 - 4.6 mg/dL    Comment: Performed at [facility] [address], [place], [place] [address]  I-Stat arterial blood gas, ED     Status: Abnormal   Collection Time: 02/23/21  8:51 AM  Result Value Ref Range   pH, Arterial 7.459 (H) 7.350 - 7.450   pCO2 arterial 33.3 32.0 - 48.0 mmHg   pO2, Arterial 147 (H) 83.0 - 108.0 mmHg   Bicarbonate 23.7 20.0 - 28.0 mmol/L   TCO2 25 22 - 32 mmol/L   O2 Saturation 99.0 %   Acid-Base Excess 0.0 0.0 - 2.0 mmol/L   Sodium 133 (L) 135 - 145 mmol/L   Potassium 4.7 3.5 - 5.1 mmol/L   Calcium, Ion 1.17 1.15 - 1.40 mmol/L   HCT 31.0 (L) 36.0 - 46.0 %   Hemoglobin 10.5 (L) 12.0 - 15.0 g/dL   Sample type ARTERIAL   Group A Strep by PCR     Status: None   Collection Time: 02/23/21  9:30 AM   Specimen: Throat; Sterile Swab  Result Value Ref Range   Group A Strep by PCR NOT DETECTED NOT DETECTED     Comment: Performed at [facility], [address] [place], [place] [address]  CBG monitoring, ED     Status: Abnormal   Collection Time: 02/23/21  2:29 PM  Result Value Ref Range   Glucose-Capillary 163 (H) 70 - 99 mg/dL    Comment: Glucose reference range applies only to samples taken after fasting for at least 8 hours.   CT Head Wo Contrast  Result Date: 02/22/2021 CLINICAL DATA:  Mental status change, unknown cause. Seen here last week had viral infection and prescribed antibiotics. Unsure if she took it. Family friend came to check on her and found her AMS and slurring words. Fever tachy and tachypneic. EXAM: CT HEAD WITHOUT CONTRAST  TECHNIQUE: Contiguous axial images were obtained from the base of the skull through the vertex without intravenous contrast. COMPARISON:  None. FINDINGS: Brain: No evidence of large-territorial acute infarction. No parenchymal hemorrhage. No mass lesion. No extra-axial collection. No mass effect or midline shift. No hydrocephalus. Basilar cisterns are patent. Vascular: No hyperdense vessel. Skull: No acute fracture or focal lesion. Sinuses/Orbits: Paranasal sinuses and mastoid air cells are clear. The orbits are unremarkable. Other: None. IMPRESSION: No acute intracranial abnormality. Electronically Signed   By: Iven Finn M.D.   On: 02/22/2021 17:43   CT CHEST ABDOMEN PELVIS W CONTRAST  Result Date: 02/22/2021 CLINICAL DATA:  Respiratory illness, nondiagnostic xray. Seen here last week had viral infection and prescribed antibiotics. Unsure if she took it. Family friend came to check on her and found her AMS and slurring words. Fever tachy and tachypneic. EXAM: CT CHEST, ABDOMEN, AND PELVIS WITH CONTRAST TECHNIQUE: Multidetector CT imaging of the chest, abdomen and pelvis was performed following the standard protocol during bolus administration of intravenous contrast. CONTRAST:  154mL OMNIPAQUE IOHEXOL 300 MG/ML  SOLN COMPARISON:  None. FINDINGS: CT  CHEST FINDINGS Cardiovascular: Normal heart size. No significant pericardial effusion. The thoracic aorta is normal in caliber. No atherosclerotic plaque of the thoracic aorta. No coronary artery calcifications. Mediastinum/Nodes: No enlarged mediastinal or hilar lymph nodes. Borderline enlarged right axillary lymph nodes. No definite left lymphadenopathy. Thyroid gland, trachea, and esophagus demonstrate no significant findings. Lungs/Pleura: No focal consolidation. No pulmonary nodule. No pulmonary mass. No pleural effusion. No pneumothorax. Musculoskeletal: No chest wall abnormality. No suspicious lytic or blastic osseous lesions. No acute displaced fracture. CT ABDOMEN PELVIS FINDINGS Hepatobiliary: No focal liver abnormality. No gallstones, gallbladder wall thickening, or pericholecystic fluid. No biliary dilatation. Pancreas: No focal lesion. Normal pancreatic contour. No surrounding inflammatory changes. No main pancreatic ductal dilatation. Spleen: Normal in size without focal abnormality. A splenule is noted. Adrenals/Urinary Tract: No adrenal nodule bilaterally. Bilateral kidneys enhance symmetrically. Mild bilateral hydroureteronephrosis. Punctate left nephrolithiasis. No right nephrolithiasis. No ureterolithiasis bilaterally. The urinary bladder is distended with urine and otherwise unremarkable. On delayed imaging, there is no urothelial wall thickening and there are no filling defects in the opacified portions of the bilateral collecting systems or ureters. Stomach/Bowel: Stomach is within normal limits. No evidence of bowel wall thickening or dilatation. Medialized mobile cecum. Appendix appears normal. Vascular/Lymphatic: No abdominal aorta or iliac aneurysm. No abdominal, pelvic, or inguinal lymphadenopathy. Reproductive: Calcified anterior uterine wall fibroid. Otherwise uterus and bilateral adnexa are unremarkable. Other: No intraperitoneal free fluid. No intraperitoneal free gas. No organized  fluid collection. Musculoskeletal: No abdominal wall hernia or abnormality. No suspicious lytic or blastic osseous lesions. No acute displaced fracture. IMPRESSION: 1. Borderline enlarged right axillary lymph nodes. Correlate with diagnostic mammography. 2. Mild bilateral hydroureteronephrosis. Associated urinary bladder distention with urine. This may reflect the residual of a recently passed calculus or reflect chronic changes of obstructive uropathy or reflux. 3. Nonobstructive punctate left nephrolithiasis. 4. Likely degenerative uterine fibroid. Electronically Signed   By: Iven Finn M.D.   On: 02/22/2021 18:11   DG Chest Port 1 View  Result Date: 02/22/2021 CLINICAL DATA:  Sepsis EXAM: PORTABLE CHEST 1 VIEW COMPARISON:  None. FINDINGS: The heart size and mediastinal contours are within normal limits. Both lungs are clear. There is curvature of spine. IMPRESSION: No active disease. Electronically Signed   By: Abelardo Diesel M.D.   On: 02/22/2021 15:26   EEG adult  Result Date: 02/23/2021 Lora Havens, MD  02/23/2021  4:59 PM Patient Name: Erin French MRN: [id number] Epilepsy Attending: Lora Havens Referring Physician/Provider: Donita Brooks, NP Date: 02/23/2021 Duration: 22.13 mins Patient history: 25yo F with ams. EEG to evaluate for seizure Level of alertness:lethargic AEDs during EEG study: None Technical aspects: This EEG study was done with scalp electrodes positioned according to the 10-20 International system of electrode placement. Electrical activity was acquired at a sampling rate of 500Hz  and reviewed with a high frequency filter of 70Hz  and a low frequency filter of 1Hz . EEG data were recorded continuously and digitally stored. Description: EEG showed continuous generalized 3 to 6 Hz theta-delta slowing admixed with frontocentral 112-14hz  beta activity Hyperventilation and photic stimulation were not performed.   Of note, study was technically difficult due to significant  sweat artifact. 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 No seizures or epileptiform discharges were seen throughout the recording. Lora Havens   EEG adult  Result Date: 02/23/2021 Lora Havens, MD     02/23/2021  7:26 AM Patient Name: Erin French MRN: [id number] Epilepsy Attending: Lora Havens Referring Physician/Provider: Dr Mitzi Hansen Date: 02/23/2021 Duration: 32.38 mins Patient history: 25yo F with ams. EEG to evaluate for seizure Level of alertness: Awake AEDs during EEG study: None Technical aspects: This EEG study was done with scalp electrodes positioned according to the 10-20 International system of electrode placement. Electrical activity was acquired at a sampling rate of 500Hz  and reviewed with a high frequency filter of 70Hz  and a low frequency filter of 1Hz . EEG data were recorded continuously and digitally stored. Description: EEG showed continuous generalized 3 to 6 Hz theta-delta slowing. Hyperventilation and photic stimulation were not performed.   Of note, study was technically difficult due to significant myogenic artifact. ABNORMALITY - Continuous slow, generalized IMPRESSION: This technically difficult study is suggestive of moderate diffuse encephalopathy, nonspecific etiology. No seizures or epileptiform discharges were seen throughout the recording. Lora Havens   ECHOCARDIOGRAM COMPLETE  Result Date: 02/23/2021    ECHOCARDIOGRAM REPORT   Patient Name:   Erin French Date of Exam: 02/23/2021 Medical Rec #:  [id number]      Height:       64.5 in Accession #:    [id number]     Weight:       134.0 lb Date of Birth:  09/09/1995      BSA:          1.659 m Patient Age:    25 years       BP:           124/93 mmHg Patient Gender: F              HR:           80 bpm. Exam Location:  Inpatient Procedure: 2D Echo, 3D Echo, Color Doppler and Cardiac  Doppler STAT ECHO Indications:    Pericardial effusion I31.3  History:        Patient has no prior history of Echocardiogram examinations.                 Fever, chills, aches, lower abdominal pain. Severe sepsis, acute                 encephalopathy, acute kidney injury.  Sonographer:    Darlina Sicilian RDCS Referring Phys: [id number] [place]  1. Left ventricular ejection fraction, by estimation, is 55 to 60%.  The left ventricle has normal function. The left ventricle has no regional wall motion abnormalities. There is moderate left ventricular hypertrophy of the lateral segment. Left ventricular diastolic parameters are consistent with Grade I diastolic dysfunction (impaired relaxation).  2. Right ventricular systolic function is normal. The right ventricular size is normal. There is normal pulmonary artery systolic pressure.  3. The pericardial effusion is circumferential. There is no evidence of cardiac tamponade.  4. The mitral valve is normal in structure. Trivial mitral valve regurgitation. No evidence of mitral stenosis.  5. The aortic valve is tricuspid. Aortic valve regurgitation is not visualized. No aortic stenosis is present.  6. The inferior vena cava is normal in size with greater than 50% respiratory variability, suggesting right atrial pressure of 3 mmHg. FINDINGS  Left Ventricle: Left ventricular ejection fraction, by estimation, is 55 to 60%. The left ventricle has normal function. The left ventricle has no regional wall motion abnormalities. The left ventricular internal cavity size was normal in size. There is  moderate left ventricular hypertrophy of the lateral segment. Left ventricular diastolic parameters are consistent with Grade I diastolic dysfunction (impaired relaxation). Indeterminate filling pressures. Right Ventricle: The right ventricular size is normal. No increase in right ventricular wall thickness. Right ventricular systolic function is normal. There is normal  pulmonary artery systolic pressure. The tricuspid regurgitant velocity is 1.99 m/s, and  with an assumed right atrial pressure of 3 mmHg, the estimated right ventricular systolic pressure is 29.7 mmHg. Left Atrium: Left atrial size was normal in size. Right Atrium: Right atrial size was normal in size. Pericardium: Trivial pericardial effusion is present. The pericardial effusion is circumferential. There is no evidence of cardiac tamponade. Mitral Valve: The mitral valve is normal in structure. Trivial mitral valve regurgitation. No evidence of mitral valve stenosis. Tricuspid Valve: The tricuspid valve is normal in structure. Tricuspid valve regurgitation is trivial. No evidence of tricuspid stenosis. Aortic Valve: The aortic valve is tricuspid. Aortic valve regurgitation is not visualized. No aortic stenosis is present. Pulmonic Valve: The pulmonic valve was normal in structure. Pulmonic valve regurgitation is trivial. No evidence of pulmonic stenosis. Aorta: The aortic root is normal in size and structure. Venous: The inferior vena cava is normal in size with greater than 50% respiratory variability, suggesting right atrial pressure of 3 mmHg. IAS/Shunts: No atrial level shunt detected by color flow Doppler.  LEFT VENTRICLE PLAX 2D LVIDd:         4.20 cm   Diastology LVIDs:         3.00 cm   LV e' medial:    8.27 cm/s LV PW:         1.20 cm   LV E/e' medial:  9.4 LV IVS:        1.20 cm   LV e' lateral:   7.40 cm/s LVOT diam:     2.20 cm   LV E/e' lateral: 10.6 LV SV:         56 LV SV Index:   34 LVOT Area:     3.80 cm  RIGHT VENTRICLE RV S prime:     11.70 cm/s TAPSE (M-mode): 2.1 cm LEFT ATRIUM             Index        RIGHT ATRIUM          Index LA diam:        2.45 cm 1.48 cm/m   RA Area:     7.56 cm LA Vol (  A2C):   32.2 ml 19.41 ml/m  RA Volume:   11.20 ml 6.75 ml/m LA Vol (A4C):   27.6 ml 16.64 ml/m LA Biplane Vol: 30.7 ml 18.51 ml/m  AORTIC VALVE LVOT Vmax:   73.40 cm/s LVOT Vmean:  45.800 cm/s LVOT  VTI:    0.147 m  AORTA Ao Root diam: 2.90 cm Ao Asc diam:  3.00 cm MITRAL VALVE               TRICUSPID VALVE MV Area (PHT): 5.75 cm    TR Peak grad:   15.8 mmHg MV Decel Time: 132 msec    TR Vmax:        199.00 cm/s MV E velocity: 78.10 cm/s MV A velocity: 82.80 cm/s  SHUNTS MV E/A ratio:  0.94        Systemic VTI:  0.15 m                            Systemic Diam: 2.20 cm Skeet Latch MD Electronically signed by Skeet Latch MD Signature Date/Time: 02/23/2021/4:05:03 PM    Final     Pending Labs Unresulted Labs (From admission, onward)     Start     Ordered   03/01/21 0500  Creatinine, serum  (enoxaparin (LOVENOX)    CrCl >/= 30 ml/min)  Weekly,   R     Comments: while on enoxaparin therapy    02/22/21 2232   02/24/21 0500  CK  Tomorrow morning,   R        02/23/21 1642   02/23/21 1429  TSH  Add-on,   AD        02/23/21 1428   02/23/21 0750  Blood gas, arterial  Once,   R        02/23/21 0750   02/23/21 0500  Comprehensive metabolic panel  Daily,   R      02/22/21 2232   02/23/21 0500  CBC WITH DIFFERENTIAL  Daily,   R      02/22/21 2232   02/22/21 1838  HSV 1/2 PCR, CSF Cerebrospinal Fluid  ([facility] ED ADULT CSF PANEL)  ONCE - STAT,   STAT        02/22/21 1837   02/22/21 1838  Pathologist smear review  Once,   STAT        02/22/21 1838   02/22/21 1838  Pathologist smear review  Once,   R        02/22/21 1838            Vitals/Pain Today's Vitals   02/23/21 1530 02/23/21 1545 02/23/21 1600 02/23/21 1615  BP: 120/82 118/74 112/70 114/71  Pulse: 82 85 83 83  Resp: (!) 45 (!) 31 (!) 28 (!) 41  Temp: (!) 93.2 F (34 C) (!) 93.6 F (34.2 C) (!) 93.9 F (34.4 C) (!) 94.1 F (34.5 C)  TempSrc:      SpO2: 100% 100% 100% 100%  Weight:      Height:      PainSc:        Isolation Precautions No active isolations  Medications Medications  acyclovir (ZOVIRAX) 610 mg in dextrose 5 % 100 mL IVPB (0 mg Intravenous Stopped 02/23/21 1434)  cefTRIAXone (ROCEPHIN) 2 g in sodium  chloride 0.9 % 100 mL IVPB (0 g Intravenous Stopped 02/23/21 1006)  lactated ringers infusion (0 mLs Intravenous Stopped 02/23/21 1325)  enoxaparin (LOVENOX) injection 40 mg (40 mg Subcutaneous Given  02/22/21 2303)  sodium chloride flush (NS) 0.9 % injection 3 mL (3 mLs Intravenous Not Given 02/23/21 0937)  acetaminophen (TYLENOL) tablet 650 mg ( Oral See Alternative 02/23/21 0752)    Or  acetaminophen (TYLENOL) suppository 650 mg (650 mg Rectal Given 02/23/21 0752)  ondansetron (ZOFRAN) tablet 4 mg (has no administration in time range)    Or  ondansetron (ZOFRAN) injection 4 mg (has no administration in time range)  lactated ringers infusion ( Intravenous New Bag/Given 02/23/21 1325)  dexamethasone (DECADRON) injection 10 mg (has no administration in time range)  vancomycin (VANCOREADY) IVPB 750 mg/150 mL (has no administration in time range)  lactated ringers bolus 1,000 mL (0 mLs Intravenous Stopped 02/22/21 1615)    And  lactated ringers bolus 1,000 mL (0 mLs Intravenous Stopped 02/22/21 1854)  ceFEPIme (MAXIPIME) 2 g in sodium chloride 0.9 % 100 mL IVPB (0 g Intravenous Stopped 02/22/21 1531)  metroNIDAZOLE (FLAGYL) IVPB 500 mg (0 mg Intravenous Stopped 02/22/21 1615)  vancomycin (VANCOREADY) IVPB 1250 mg/250 mL (0 mg Intravenous Stopped 02/22/21 1855)  acetaminophen (TYLENOL) suppository 650 mg (650 mg Rectal Given 02/22/21 1446)  LORazepam (ATIVAN) injection 1 mg (1 mg Intravenous Given 02/22/21 1640)  iohexol (OMNIPAQUE) 300 MG/ML solution 100 mL (100 mLs Intravenous Contrast Given 02/22/21 1735)  lactated ringers bolus 1,000 mL (0 mLs Intravenous Stopped 02/22/21 2020)  propofol (DIPRIVAN) 500 MG/50ML infusion (0 mcg/kg/min  60.8 kg Intravenous Stopped 02/22/21 2009)  propofol (DIPRIVAN) 10 mg/mL bolus/IV push (30.4 mg Intravenous Given 02/22/21 1913)  LORazepam (ATIVAN) injection 1 mg (1 mg Intravenous Given 02/22/21 2048)  dexamethasone (DECADRON) injection 10 mg (10 mg Intravenous Given  02/22/21 2210)    Mobility non-ambulatory at this time due to confusion High fall risk   Focused Assessments Cardiac Assessment Handoff:  Cardiac Rhythm: Sinus tachycardia Lab Results  Component Value Date   CKTOTAL 6,127 (H) 02/22/2021   No results found for: DDIMER Does the Patient currently have chest pain? No   , Neuro Assessment Handoff:  Swallow screen pass? No  Cardiac Rhythm: Sinus tachycardia   Last date known well: 02/15/21   Neuro Assessment: Exceptions to WDL Neuro Checks:      Last Documented NIHSS Modified Score:   Has TPA been given? No If patient is a Neuro Trauma and patient is going to OR before floor call report to [place] nurse: [phone] or [phone]  , Pulmonary Assessment Handoff:  Lung sounds:   O2 Device: Nasal Cannula O2 Flow Rate (L/min): 2 L/min    R Recommendations: See Admitting Provider Note  Report given to:   Additional Notes: Hypothermic at this time. Temp sensing foley in place. Bair hugger currently on pt.

## 2021-02-23 NOTE — ED Notes (Signed)
Messaged Dr. Dewaine Oats to inform her that pt has ripped out multiple IV and has taken equipment off. Asked for soft restraints. Also informed Dr. Dewaine Oats that pts RR is labored and in the 23s

## 2021-02-24 DIAGNOSIS — R6520 Severe sepsis without septic shock: Secondary | ICD-10-CM | POA: Diagnosis not present

## 2021-02-24 DIAGNOSIS — G9340 Encephalopathy, unspecified: Secondary | ICD-10-CM | POA: Diagnosis not present

## 2021-02-24 DIAGNOSIS — A419 Sepsis, unspecified organism: Secondary | ICD-10-CM | POA: Diagnosis not present

## 2021-02-24 DIAGNOSIS — G0490 Encephalitis and encephalomyelitis, unspecified: Secondary | ICD-10-CM | POA: Diagnosis not present

## 2021-02-24 LAB — CBC WITH DIFFERENTIAL/PLATELET
Abs Immature Granulocytes: 0.04 10*3/uL (ref 0.00–0.07)
Basophils Absolute: 0 10*3/uL (ref 0.0–0.1)
Basophils Relative: 0 %
Eosinophils Absolute: 0 10*3/uL (ref 0.0–0.5)
Eosinophils Relative: 0 %
HCT: 27.7 % — ABNORMAL LOW (ref 36.0–46.0)
Hemoglobin: 9.4 g/dL — ABNORMAL LOW (ref 12.0–15.0)
Immature Granulocytes: 1 %
Lymphocytes Relative: 5 %
Lymphs Abs: 0.3 10*3/uL — ABNORMAL LOW (ref 0.7–4.0)
MCH: 25.7 pg — ABNORMAL LOW (ref 26.0–34.0)
MCHC: 33.9 g/dL (ref 30.0–36.0)
MCV: 75.7 fL — ABNORMAL LOW (ref 80.0–100.0)
Monocytes Absolute: 0.2 10*3/uL (ref 0.1–1.0)
Monocytes Relative: 2 %
Neutro Abs: 6.1 10*3/uL (ref 1.7–7.7)
Neutrophils Relative %: 92 %
Platelets: 141 10*3/uL — ABNORMAL LOW (ref 150–400)
RBC: 3.66 MIL/uL — ABNORMAL LOW (ref 3.87–5.11)
RDW: 13.9 % (ref 11.5–15.5)
WBC: 6.7 10*3/uL (ref 4.0–10.5)
nRBC: 0 % (ref 0.0–0.2)

## 2021-02-24 LAB — COMPREHENSIVE METABOLIC PANEL
ALT: 21 U/L (ref 0–44)
AST: 84 U/L — ABNORMAL HIGH (ref 15–41)
Albumin: 2.5 g/dL — ABNORMAL LOW (ref 3.5–5.0)
Alkaline Phosphatase: 24 U/L — ABNORMAL LOW (ref 38–126)
Anion gap: 9 (ref 5–15)
BUN: 12 mg/dL (ref 6–20)
CO2: 22 mmol/L (ref 22–32)
Calcium: 8.3 mg/dL — ABNORMAL LOW (ref 8.9–10.3)
Chloride: 102 mmol/L (ref 98–111)
Creatinine, Ser: 0.7 mg/dL (ref 0.44–1.00)
GFR, Estimated: >60 mL/min (ref >60)
Glucose, Bld: 116 mg/dL — ABNORMAL HIGH (ref 70–99)
Potassium: 4.6 mmol/L (ref 3.5–5.1)
Sodium: 133 mmol/L — ABNORMAL LOW (ref 135–145)
Total Bilirubin: 0.7 mg/dL (ref 0.3–1.2)
Total Protein: 6 g/dL — ABNORMAL LOW (ref 6.5–8.1)

## 2021-02-24 LAB — C-REACTIVE PROTEIN: CRP: 1.9 mg/dL — ABNORMAL HIGH (ref <1.0)

## 2021-02-24 LAB — CK: Total CK: 3292 U/L — ABNORMAL HIGH (ref 38–234)

## 2021-02-24 LAB — SEDIMENTATION RATE: Sed Rate: 18 mm/hr (ref 0–22)

## 2021-02-24 MED ORDER — LACTATED RINGERS IV BOLUS
1000.0000 mL | Freq: Once | INTRAVENOUS | Status: AC
  Administered 2021-02-24: 1000 mL via INTRAVENOUS

## 2021-02-24 NOTE — Consult Note (Signed)
NEUROLOGY CONSULTATION NOTE   Date of service: February 24, 2021 Patient Name: Erin French MRN:  [id number] DOB:  [birth year] Reason for consult: "Viral meningitis" Requesting Provider: Oren Binet* _ _ _   _ __   _ __ _ _  __ __   _ __   __ _  History of Present Illness  Erin French is a 26 y.o. female with PMH significant for chlamydia who was initially seen in the ED on 02/15/21 with fever, chills, generalized body aches, pain in her womb and felt to have a viral prodrome and was found by her family on 02/22/21 confused, dehydrated and brought to the ED. She was admitted with AMS, fever, chills and lower abdominal pain. Extensive workup demonstrated possible UTI and CSF with WBC count of 19 in tube 4 and CSF protein of 120 concerning for a viral meningitis.  On my evaluation, patient is asleep, opens eyes and answers questions appropriately. Patient's significant other is at bedside and reports that earlier,she had fairly intelligent conversation. Significant other saw patient before christmas and left and came back after new years to find her in the hospital. Patient has very little recollection of what happened before her admission.   ROS   Constitutional Denies weight loss, endorses fever and lethargy.   HEENT Denies changes in vision and hearing.   Respiratory Denies SOB and cough.   CV Denies palpitations and CP   GI mild abdominal pain, nausea but no vomiting and diarrhea.   GU Denies dysuria and urinary frequency.   MSK Endorses myalgia but no joint pain.   Skin Denies rash and pruritus.   Neurological Endorses headaches but no syncope.   Psychiatric Denies recent changes in mood. Denies anxiety and depression.    Past History   Past Medical History:  Diagnosis Date   Chlamydia 4/12   Past Surgical History:  Procedure Laterality Date   CESAREAN SECTION N/A 11/17/2018   Procedure: CESAREAN SECTION;  Surgeon: Aletha Halim, MD;  Location: [facility] LD ORS;   Service: Obstetrics;  Laterality: N/A;   INDUCED ABORTION     Family History  Problem Relation Age of Onset   Hypertension Father    Schizophrenia Mother    Hypertension Maternal Grandmother    Social History   Socioeconomic History   Marital status: Single    Spouse name: Not on file   Number of children: Not on file   Years of education: Not on file   Highest education level: Not on file  Occupational History   Not on file  Tobacco Use   Smoking status: Never   Smokeless tobacco: Never  Vaping Use   Vaping Use: Never used  Substance and Sexual Activity   Alcohol use: Not Currently    Alcohol/week: 0.0 standard drinks    Comment: Last drink December 2019   Drug use: No   Sexual activity: Yes    Partners: Male    Comment: INTERCOURSE AGE 37, SEXUAL PARTNERS LESS THAN 5  Other Topics Concern   Not on file  Social History Narrative   Not on file   Social Determinants of Health   Financial Resource Strain: Not on file  Food Insecurity: Not on file  Transportation Needs: Not on file  Physical Activity: Not on file  Stress: Not on file  Social Connections: Not on file   No Known Allergies  Medications   Medications Prior to Admission  Medication Sig Dispense Refill Last Dose   ferrous sulfate 325 (  65 FE) MG tablet Take 1 tablet (325 mg total) by mouth 2 (two) times daily with a meal. (Patient not taking: Reported on 12/06/2018) 60 tablet 0 Not Taking   metroNIDAZOLE (FLAGYL) 500 MG tablet Take 1 tablet (500 mg total) by mouth 2 (two) times daily. (Patient not taking: Reported on 02/22/2021) 14 tablet 0 Completed Course     Vitals   Vitals:   02/24/21 0747 02/24/21 1147 02/24/21 1607 02/24/21 2000  BP: 116/74 120/69 104/64 118/60  Pulse: 90 87 89 84  Resp: 16 16 16 20   Temp: (!) 97.4 F (36.3 C) (!) 97.5 F (36.4 C) (!) 97.5 F (36.4 C) 97.8 F (36.6 C)  TempSrc: Oral Oral Oral Oral  SpO2: 98% 97% 97% 97%  Weight:      Height:         Body mass  index is 22.58 kg/m.  Physical Exam   General: Laying comfortably in bed; in no acute distress. HENT: Normal oropharynx and mucosa. Normal external appearance of ears and nose. Neck: Supple, no pain or tenderness CV: No JVD. No peripheral edema. Pulmonary: Symmetric Chest rise. Normal respiratory effort. Abdomen: Soft to touch, non-tender. Ext: No cyanosis, edema, or deformity Skin: No rash. Normal palpation of skin.  Musculoskeletal: Normal digits and nails by inspection. No clubbing.  Neurologic Examination  Mental status/Cognition: somnolent, awakens to voice. Poor attention upon awakening. Oriented to self, place, month and year. Speech/language: Fluent, comprehension intact, object naming intact, repetition intact. Cranial nerves:   CN II Pupils equal and reactive to light, no VF deficits   CN III,IV,VI EOM intact, no gaze preference or deviation, no nystagmus    CN V normal sensation in V1, V2, and V3 segments bilaterally    CN VII no asymmetry, no nasolabial fold flattening    CN VIII normal hearing to speech    CN IX & X normal palatal elevation, no uvular deviation    CN XI 5/5 head turn and 5/5 shoulder shrug bilaterally    CN XII midline tongue protrusion    Motor:  Muscle bulk: normal, tone normal, pronator drift none tremor none Mvmt Root Nerve  Muscle Right Left Comments  SA C5/6 Ax Deltoid     EF C5/6 Mc Biceps 5 5   EE C6/7/8 Rad Triceps 5 5   WF C6/7 Med FCR     WE C7/8 PIN ECU     F Ab C8/T1 U ADM/FDI 5 5   HF L1/2/3 Fem Illopsoas 4 4   KE L2/3/4 Fem Quad     DF L4/5 D Peron Tib Ant 5 5   PF S1/2 Tibial Grc/Sol 5 5    Reflexes:  Right Left Comments  Pectoralis      Biceps (C5/6) 2 2   Brachioradialis (C5/6) 2 2    Triceps (C6/7) 2 2    Patellar (L3/4) 2 2    Achilles (S1)      Hoffman      Plantar     Jaw jerk    Sensation:  Light touch Intact throughout   Pin prick    Temperature    Vibration   Proprioception    Coordination/Complex  Motor:  - Finger to Nose intact BL - Heel to shin intact BL - Rapid alternating movement are normal - Gait: unsafe to assess given somnolence.  Labs   CBC:  Recent Labs  Lab 02/23/21 0741 02/23/21 0851 02/24/21 0243  WBC 6.6  --  6.7  NEUTROABS 6.3  --  6.1  HGB 9.1* 10.5* 9.4*  HCT 27.9* 31.0* 27.7*  MCV 78.6*  --  75.7*  PLT PLATELET CLUMPS NOTED ON SMEAR, UNABLE TO ESTIMATE  --  141*    Basic Metabolic Panel:  Lab Results  Component Value Date   NA 133 (L) 02/24/2021   K 4.6 02/24/2021   CO2 22 02/24/2021   GLUCOSE 116 (H) 02/24/2021   BUN 12 02/24/2021   CREATININE 0.70 02/24/2021   CALCIUM 8.3 (L) 02/24/2021   GFRNONAA >60 02/24/2021   GFRAA >60 11/18/2018   Lipid Panel: No results found for: LDLCALC HgbA1c: No results found for: HGBA1C Urine Drug Screen:     Component Value Date/Time   LABOPIA NONE DETECTED 02/22/2021 2149   COCAINSCRNUR NONE DETECTED 02/22/2021 2149   LABBENZ NONE DETECTED 02/22/2021 2149   AMPHETMU NONE DETECTED 02/22/2021 2149   THCU POSITIVE (A) 02/22/2021 2149   LABBARB NONE DETECTED 02/22/2021 2149    Alcohol Level No results found for: [facility]  CT Head without contrast(Personally reviewed): CTH was negative for a large hypodensity concerning for a large territory infarct or hyperdensity concerning for an ICH.  MRI Brain: pending  Impression   Erin French is a 26 y.o. female who is admitted with confusion, fever, chills and lower abdominal pain. Extensive workup with CSF studies with pleocytosis (WBC 21) which has a neutrophilic predominance, elevated protein to 120 and mild hypoglycorrhachia(Glu: 30).  Her clinical presentation and CSF studies would be most consistent with a viral meningitis. However, somewhat odd for a viral meningitis to have neutrophilic predominant pleocytosis along with hypoglycorrhachia this late into symptoms.  Recommendations  - Agree with Empiric meningitis coverage. - Continue Acyclovir until HSV  PCR results. - I ordered Lyme PCR CSF - Recommend getting ID's input on the noted neutrophilic predominant pleocytosis and mild hypoglycorrhachia to see if they think that this is still consistent with a viral meningitis.  ______________________________________________________________________   Thank you for the opportunity to take part in the care of this patient. If you have any further questions, please contact the neurology consultation attending.  Signed,  [place] Pager Number [phone] _ _ _   _ __   _ __ _ _  __ __   _ __   __ _

## 2021-02-24 NOTE — Progress Notes (Signed)
No s/s of distress noted. Patient denies SOB, chest pain and difficulty breathing. She does state that he vision is off and she is seeing double for about 2 days not.   02/23/21 2000  Assess: MEWS Score  Temp 98.8 F (37.1 C)  BP (!) 114/53  Pulse Rate 92  ECG Heart Rate 93  Resp (!) 32  SpO2 99 %  O2 Device Nasal Cannula  O2 Flow Rate (L/min) 2 L/min  Assess: MEWS Score  MEWS Temp 0  MEWS Systolic 0  MEWS Pulse 0  MEWS RR 2  MEWS LOC 1  MEWS Score 3  MEWS Score Color Yellow  Assess: if the MEWS score is Yellow or Red  Were vital signs taken at a resting state? Yes  Focused Assessment No change from prior assessment  Early Detection of Sepsis Score *See Row Information* Low  MEWS guidelines implemented *See Row Information* No, previously yellow, continue vital signs every 4 hours  Treat  MEWS Interventions Administered scheduled meds/treatments;Administered prn meds/treatments  Pain Scale 0-10  Pain Score 0  Take Vital Signs  Increase Vital Sign Frequency   (COntinue VS every 4 hours)  Document  Patient Outcome Other (Comment) (stable and no s/s of distress noted)  Progress note created (see row info) Yes

## 2021-02-24 NOTE — Consult Note (Deleted)
NAME:  Erin French, MRN:  [id number], DOB:  Feb 10, 1996, LOS: 2 ADMISSION DATE:  02/22/2021, CONSULTATION DATE:  02/23/21 REFERRING MD:  Dr. Jamse Arn, CHIEF COMPLAINT: AMS    History of Present Illness:  26 y/o F who presented to [facility] ER on 1/1 with reports of altered mental status.   She was seen in the ER on 12/24 with body aches, fever to 103, chills and cough for 4-5 days. LMP documented at that time of 02/02/21.  She also reported nausea, dry heaves, diarrhea, decreased intake and abdominal pain.  She was treated for viral illness and discharged.   She returns to the ER on 1/1 via EMS after being found at home altered, slurring words by a friend.  She was febrile and breathing fast.  On initial evaluation in the ER, she was profoundly altered, disoriented.  She was tender to palpation in abdomen.  Her temperature ws 102.9. CT of the abdomen/pelvis showed borderline enlarged right axillary nodes, mild bilateral hydroureteronephrosis associated bladder distention with urine, non-obstructive punctate left nephrolithiasis.  CT head was negative. Labs showed CK 6,127,  Na 131, K 5.2, CO2 21, glucose 116, BUN 14, Sr Cr 1.03, calcium 8.2, phos 2.7, mag 2.0, albumin 2.9, ast 120, WBC 6.6, Hgb 9.1, MCV 78 and clumped platelets.  No shift on differential.  Given AMS and no clear etiology, LP was warranted but EDP unable to reach family for procedure consent.  Ativan was given and LP performed.  CSF showed <30 glucose, WBC 19, neutrophils 77, lymph 21, total protein 120, concerning for possible viral meningitis. She remained persistently hypothermic, bair hugger applied.   PCCM consulted for evaluation of encephalopathy & hypothermia.    Pertinent  Medical History  Chlamydia   Significant Hospital Events: Including procedures, antibiotic start and stop dates in addition to other pertinent events   12/31 admit with AMS  1/1 PCCM consulted 1/2 Improved mental status but not at baseline   Interim  History / Subjective:  Afebrile  Diet ordered overnight  Pt asking for food   Objective   Blood pressure 116/74, pulse 90, temperature (!) 97.4 F (36.3 C), temperature source Oral, resp. rate 16, height 5' 4.5" (1.638 m), weight 60.6 kg, SpO2 98 %, currently breastfeeding.        Intake/Output Summary (Last 24 hours) at 02/24/2021 5102 Last data filed at 02/24/2021 5852 Gross per 24 hour  Intake 3439.14 ml  Output 2150 ml  Net 1289.14 ml   Filed Weights   02/22/21 1323 02/24/21 0500  Weight: 60.8 kg 60.6 kg    Examination: General: ill appearing young adult female lying in bed in NAD HEENT: MM pink/extremely dry chapped lips, good dentition, anicteric Neuro: Awake, alert, able to correctly answer place / oriented to self, not oriented to year, appears surprised when date is shared with her, MAE / generalized weakness  CV: s1s2 RRR, no m/r/g PULM: non-labored at rest, lungs bilaterally clear GI: soft, bsx4 active, tolerating PO's Extremities: warm/dry, no edema  Skin: no rashes or lesions  Resolved Hospital Problem list     Assessment & Plan:   Acute Metabolic Encephalopathy  Suspected Viral Encephalitis   CSF fluid concerning for possible viral encephalitis + severe dehydration  Recent viral illness over Christmas. UDS positive for THC.   -improving mental status but was profoundly altered and slowly coming back to baseline. Consider neuro consultation.  -group A strep negative, TSH wnl, HIV negative -GC chlamydia pending, HSV pending, CSF culture pending -hydration  with IVF until adequately taking PO's  -empiric neuro coverage with abx and antivirals -continue decadron 10 mg Q6  -EEG with diffuse slowing -limit further sedating medications  -frequent reorientation  -PT consulted -seizure precautions  Pericardial Effusion  Grade I Diastolic Dysfunction  ? If related to recent viral illness.  Effusion circumferential without tamponade.  -send limited autoimmune  panel given presentation   Hypothermia  -improving, monitor with temp foley  -active rewarming measures / bair hugger   AKI  Dehydration / Volume Depletion  Rhabdomyolysis  Hyponatremia  -follow CK trend  -additional 1L LR bolus, LR at 150ml/hr  -Trend BMP / urinary output -Replace electrolytes as indicated -Avoid nephrotoxic agents, ensure adequate renal perfusion  Anemia  Note CT findings of old fibroid  -follow CBC  -transfuse for Hgb <7% or active bleeding   Best Practice (right click and "Reselect all SmartList Selections" daily)  Per Primary      Noe Gens, MSN, APRN, NP-C, AGACNP-BC [facility] 02/24/2021, 9:17 AM   Please see [email or web address] for pager details.   From 7A-7P if no response, please call [phone] After hours, please call ELink [phone]

## 2021-02-24 NOTE — Progress Notes (Signed)
[facility] night cross cover note:  I was notified by RN with request for initiation of a diet order in the setting of the patient's interval improvement in mental status such that she has been able to participate in and pass nursing bedside swallow screen.  As such, I placed order for clear liquid diet to be advanced as tolerated.    Babs Bertin, DO Hospitalist

## 2021-02-24 NOTE — Progress Notes (Signed)
NAME:  Erin French, MRN:  [id number], DOB:  [birth year], LOS: 2 ADMISSION DATE:  02/22/2021, CONSULTATION DATE:  02/23/21 REFERRING MD:  Dr. Jamse Arn, CHIEF COMPLAINT: AMS    History of Present Illness:  26 y/o F who presented to [facility] ER on 1/1 with reports of altered mental status.   She was seen in the ER on 12/24 with body aches, fever to 103, chills and cough for 4-5 days. LMP documented at that time of 02/02/21.  She also reported nausea, dry heaves, diarrhea, decreased intake and abdominal pain.  She was treated for viral illness and discharged.   She returns to the ER on 1/1 via EMS after being found at home altered, slurring words by a friend.  She was febrile and breathing fast.  On initial evaluation in the ER, she was profoundly altered, disoriented.  She was tender to palpation in abdomen.  Her temperature ws 102.9. CT of the abdomen/pelvis showed borderline enlarged right axillary nodes, mild bilateral hydroureteronephrosis associated bladder distention with urine, non-obstructive punctate left nephrolithiasis.  CT head was negative. Labs showed CK 6,127,  Na 131, K 5.2, CO2 21, glucose 116, BUN 14, Sr Cr 1.03, calcium 8.2, phos 2.7, mag 2.0, albumin 2.9, ast 120, WBC 6.6, Hgb 9.1, MCV 78 and clumped platelets.  No shift on differential.  Given AMS and no clear etiology, LP was warranted but EDP unable to reach family for procedure consent.  Ativan was given and LP performed.  CSF showed <30 glucose, WBC 19, neutrophils 77, lymph 21, total protein 120, concerning for possible viral meningitis. She remained persistently hypothermic, bair hugger applied.   PCCM consulted for evaluation of encephalopathy & hypothermia.    Pertinent  Medical History  Chlamydia   Significant Hospital Events: Including procedures, antibiotic start and stop dates in addition to other pertinent events   12/31 admit with AMS  1/1 PCCM consulted 1/2 Improved mental status but not at baseline   Interim  History / Subjective:  Afebrile  Diet ordered overnight  Pt asking for food   Objective   Blood pressure 116/74, pulse 90, temperature (!) 97.4 F (36.3 C), temperature source Oral, resp. rate 16, height 5' 4.5" (1.638 m), weight 60.6 kg, SpO2 98 %, currently breastfeeding.        Intake/Output Summary (Last 24 hours) at 02/24/2021 1012 Last data filed at 02/24/2021 5035 Gross per 24 hour  Intake 3439.14 ml  Output 2150 ml  Net 1289.14 ml   Filed Weights   02/22/21 1323 02/24/21 0500  Weight: 60.8 kg 60.6 kg    Examination: General: ill appearing young adult female lying in bed in NAD HEENT: MM pink/extremely dry chapped lips, good dentition, anicteric Neuro: Awake, alert, able to correctly answer place / oriented to self, not oriented to year, appears surprised when date is shared with her, MAE / generalized weakness  CV: s1s2 RRR, no m/r/g PULM: non-labored at rest, lungs bilaterally clear GI: soft, bsx4 active, tolerating PO's Extremities: warm/dry, no edema  Skin: no rashes or lesions  Resolved Hospital Problem list     Assessment & Plan:   Acute Metabolic Encephalopathy  Suspected Viral Encephalitis   CSF fluid concerning for possible viral encephalitis + severe dehydration  Recent viral illness over Christmas. UDS positive for THC.   -improving mental status but was profoundly altered and slowly coming back to baseline. Consider neuro consultation.  -group A strep negative, TSH wnl, HIV negative -GC chlamydia pending, HSV pending, CSF culture pending -hydration  with IVF until adequately taking PO's  -empiric neuro coverage with abx and antivirals -continue decadron 10 mg Q6  -EEG with diffuse slowing -limit further sedating medications  -frequent reorientation  -PT consulted -seizure precautions  Pericardial Effusion  Grade I Diastolic Dysfunction  ? If related to recent viral illness.  Effusion circumferential without tamponade.  -send limited autoimmune  panel given presentation   Hypothermia  -improving, monitor with temp foley  -active rewarming measures / bair hugger   AKI  Dehydration / Volume Depletion  Rhabdomyolysis  Hyponatremia  -follow CK trend  -additional 1L LR bolus, LR at 134ml/hr  -Trend BMP / urinary output -Replace electrolytes as indicated -Avoid nephrotoxic agents, ensure adequate renal perfusion  Anemia  Note CT findings of old fibroid  -follow CBC  -transfuse for Hgb <7% or active bleeding   Best Practice (right click and "Reselect all SmartList Selections" daily)  Per Primary      Noe Gens, MSN, APRN, NP-C, AGACNP-BC [facility] 02/24/2021, 10:12 AM   Please see [email or web address] for pager details.   From 7A-7P if no response, please call [phone] After hours, please call ELink [phone]

## 2021-02-24 NOTE — Progress Notes (Signed)
Made MD on call for floor coverage aware of some things the patient and I discussed. As I was asking questions about what happened and what led to the admission, she mentioned that she has been weak and unable to walk normally for more than three weeks and has fallen multiple times. She mentioned her hands shaking and being unable to steady her hands if holding a cup. Lastly, she states that she has been seeing double for a while and her vision is blurred.

## 2021-02-24 NOTE — Progress Notes (Signed)
PROGRESS NOTE    Geraldin Habermehl  STM:[id number]  DOB: 08/07/1995  DOA: 02/22/2021 PCP: Karleen Dolphin, MD (Inactive) Outpatient Specialists:   Hospital course:  26 year old female with history of chlamydia was admitted last night with altered mental status, fevers chills and lower abdominal pain.  Patient had extensive work-up as she was unable to provide a history.  Work-up was notable for CSF consistent with likely viral meningitis and possible UTI.  Also noted to have UDS positive only for THC, CPK 6000 and normal lactate.  COVID and influenza were negative.  Head CT was negative.  Patient was started on broad-spectrum antibiotics including ceftriaxone, acyclovir to cover meningitis as well as vancomycin and Flagyl.   Subjective:  Patient was sleeping when I went in however was slowly arousable by voice alone.  Patient was speaking slowly but stated she understood she was in the hospital.  She complained of aching in her entire body.  Admits that she has been feeling unwell for a couple of weeks.  Spoke with patient's aunt Ms. Harl Bowie who noted that she came by to see patient last night.  She felt the patient knew who she was, was asking about her son and also remembered that it was Ms. Harl Bowie is birthday today.  Objective: Vitals:   02/24/21 0600 02/24/21 0747 02/24/21 1147 02/24/21 1607  BP: 119/69 116/74 120/69 104/64  Pulse: 79 90 87 89  Resp: (!) 35 16 16 16   Temp: (!) 97.2 F (36.2 C) (!) 97.4 F (36.3 C) (!) 97.5 F (36.4 C) (!) 97.5 F (36.4 C)  TempSrc:  Oral Oral Oral  SpO2: 99% 98% 97% 97%  Weight:      Height:        Intake/Output Summary (Last 24 hours) at 02/24/2021 1730 Last data filed at 02/24/2021 0610 Gross per 24 hour  Intake 3439.14 ml  Output 1450 ml  Net 1989.14 ml    Filed Weights   02/22/21 1323 02/24/21 0500  Weight: 60.8 kg 60.6 kg     Exam:  General: Thin female sleeping comfortably in bed, arousable by voice alone, oriented x3.    Eyes: sclera anicteric, conjuctiva with significant injection bilaterally  CVS: S1-S2, tachycardic, 2/6 murmur. Respiratory: Good air entry bilaterally with transmitted upper respiratory sounds  GI: NABS, soft, initially tender to palpation as was the rest of her body however on recheck patient had no tenderness to light or deep palpation. LE: No edema.  Neuro: Mental status is much improved since yesterday, however she continues to have slow cognition, slow speech and bradykinesia.    Assessment & Plan:   26 year old female was admitted with fever and altered mental status with work-up suggestive of possible viral meningitis and or UTI.  Altered mental status with fever followed by hypothermia/severe sepsis Patient continues to improve over the past 24 hours. I suspect this is a viral meningitis, will continue ceftriaxone and acyclovir. However given age and presentation, cannot rule out possible autoimmune cerebritis Continue Decadron 10 mg every 6 as started yesterday per PCCM. Will request neurology consultation for guidance for additional work-up if warranted UDS is positive only for [facility] and patient denies other substance use HIV is negative, HSV is pending Will request ID consultation   Hypothermia Patient's temperature has improved to 97 with ongoing use of Bair hugger blanket.  R/O Addison's disease Given low sodium and higher potassium, there is concern for possible adrenal insufficiency However patient is already been started on Decadron so cannot test accurately  Will continue dexamethasone 10 every 6 hours per PCCM recommendation Follow sodium and potassium  AKI in setting of mild elevation of CPK Resolved with hydration Follow CPK  Safety of patient's child Very much appreciate ED case manager who was able to ascertain that patient's child has been picked up by a family member and is being cared for safely as per her discussion with officer.  Axillary  adenopathy Patient can have diagnostic mammogram if warranted as an outpatient    DVT prophylaxis: Lovenox Code Status: Full Family Communication: Multiple attempts to call spouse was unsuccessful, will try to get a contact phone number from Officer who did safety check Disposition Plan:   Patient is from: Home  Anticipated Discharge Location: Home  Barriers to Discharge: Acutely ill and septic  Is patient medically stable for Discharge: No   Scheduled Meds:  Chlorhexidine Gluconate Cloth  6 each Topical Daily   dexamethasone (DECADRON) injection  10 mg Intravenous Q6H   enoxaparin (LOVENOX) injection  40 mg Subcutaneous Q24H   sodium chloride flush  3 mL Intravenous Q12H   Continuous Infusions:  acyclovir 610 mg (02/24/21 1538)   cefTRIAXone (ROCEPHIN)  IV 2 g (02/24/21 0839)   lactated ringers 150 mL/hr at 02/24/21 0304   vancomycin 750 mg (02/24/21 0832)    Data Reviewed:  Basic Metabolic Panel: Recent Labs  Lab 02/22/21 1346 02/22/21 1447 02/23/21 0741 02/23/21 0851 02/24/21 0243  NA 127* 130*   130* 131* 133* 133*  K 4.3 4.5   4.5 5.2* 4.7 4.6  CL 97* 98 101  --  102  CO2 20*  --  21*  --  22  GLUCOSE 106* 97 116*  --  116*  BUN 19 21* 14  --  12  CREATININE 1.40* 1.20* 1.03*  --  0.70  CALCIUM 8.3*  --  8.2*  --  8.3*  MG  --   --  2.0  --   --   PHOS  --   --  3.7  --   --      CBC: Recent Labs  Lab 02/22/21 1346 02/22/21 1447 02/23/21 0741 02/23/21 0851 02/24/21 0243  WBC 4.9  --  6.6  --  6.7  NEUTROABS 4.3  --  6.3  --  6.1  HGB 11.2* 12.2   12.9 9.1* 10.5* 9.4*  HCT 33.2* 36.0   38.0 27.9* 31.0* 27.7*  MCV 77.6*  --  78.6*  --  75.7*  PLT 154  --  PLATELET CLUMPS NOTED ON SMEAR, UNABLE TO ESTIMATE  --  141*     Studies: CT Head Wo Contrast  Result Date: 02/22/2021 CLINICAL DATA:  Mental status change, unknown cause. Seen here last week had viral infection and prescribed antibiotics. Unsure if she took it. Family friend came to check  on her and found her AMS and slurring words. Fever tachy and tachypneic. EXAM: CT HEAD WITHOUT CONTRAST TECHNIQUE: Contiguous axial images were obtained from the base of the skull through the vertex without intravenous contrast. COMPARISON:  None. FINDINGS: Brain: No evidence of large-territorial acute infarction. No parenchymal hemorrhage. No mass lesion. No extra-axial collection. No mass effect or midline shift. No hydrocephalus. Basilar cisterns are patent. Vascular: No hyperdense vessel. Skull: No acute fracture or focal lesion. Sinuses/Orbits: Paranasal sinuses and mastoid air cells are clear. The orbits are unremarkable. Other: None. IMPRESSION: No acute intracranial abnormality. Electronically Signed   By: Iven Finn M.D.   On: 02/22/2021 17:43   CT  CHEST ABDOMEN PELVIS W CONTRAST  Result Date: 02/22/2021 CLINICAL DATA:  Respiratory illness, nondiagnostic xray. Seen here last week had viral infection and prescribed antibiotics. Unsure if she took it. Family friend came to check on her and found her AMS and slurring words. Fever tachy and tachypneic. EXAM: CT CHEST, ABDOMEN, AND PELVIS WITH CONTRAST TECHNIQUE: Multidetector CT imaging of the chest, abdomen and pelvis was performed following the standard protocol during bolus administration of intravenous contrast. CONTRAST:  152mL OMNIPAQUE IOHEXOL 300 MG/ML  SOLN COMPARISON:  None. FINDINGS: CT CHEST FINDINGS Cardiovascular: Normal heart size. No significant pericardial effusion. The thoracic aorta is normal in caliber. No atherosclerotic plaque of the thoracic aorta. No coronary artery calcifications. Mediastinum/Nodes: No enlarged mediastinal or hilar lymph nodes. Borderline enlarged right axillary lymph nodes. No definite left lymphadenopathy. Thyroid gland, trachea, and esophagus demonstrate no significant findings. Lungs/Pleura: No focal consolidation. No pulmonary nodule. No pulmonary mass. No pleural effusion. No pneumothorax.  Musculoskeletal: No chest wall abnormality. No suspicious lytic or blastic osseous lesions. No acute displaced fracture. CT ABDOMEN PELVIS FINDINGS Hepatobiliary: No focal liver abnormality. No gallstones, gallbladder wall thickening, or pericholecystic fluid. No biliary dilatation. Pancreas: No focal lesion. Normal pancreatic contour. No surrounding inflammatory changes. No main pancreatic ductal dilatation. Spleen: Normal in size without focal abnormality. A splenule is noted. Adrenals/Urinary Tract: No adrenal nodule bilaterally. Bilateral kidneys enhance symmetrically. Mild bilateral hydroureteronephrosis. Punctate left nephrolithiasis. No right nephrolithiasis. No ureterolithiasis bilaterally. The urinary bladder is distended with urine and otherwise unremarkable. On delayed imaging, there is no urothelial wall thickening and there are no filling defects in the opacified portions of the bilateral collecting systems or ureters. Stomach/Bowel: Stomach is within normal limits. No evidence of bowel wall thickening or dilatation. Medialized mobile cecum. Appendix appears normal. Vascular/Lymphatic: No abdominal aorta or iliac aneurysm. No abdominal, pelvic, or inguinal lymphadenopathy. Reproductive: Calcified anterior uterine wall fibroid. Otherwise uterus and bilateral adnexa are unremarkable. Other: No intraperitoneal free fluid. No intraperitoneal free gas. No organized fluid collection. Musculoskeletal: No abdominal wall hernia or abnormality. No suspicious lytic or blastic osseous lesions. No acute displaced fracture. IMPRESSION: 1. Borderline enlarged right axillary lymph nodes. Correlate with diagnostic mammography. 2. Mild bilateral hydroureteronephrosis. Associated urinary bladder distention with urine. This may reflect the residual of a recently passed calculus or reflect chronic changes of obstructive uropathy or reflux. 3. Nonobstructive punctate left nephrolithiasis. 4. Likely degenerative uterine  fibroid. Electronically Signed   By: Iven Finn M.D.   On: 02/22/2021 18:11   EEG adult  Result Date: 02/23/2021 Lora Havens, MD     02/23/2021  4:59 PM Patient Name: Clarie Camey MRN: [id number] Epilepsy Attending: Lora Havens Referring Physician/Provider: Donita Brooks, NP Date: 02/23/2021 Duration: 22.13 mins Patient history: 25yo F with ams. EEG to evaluate for seizure Level of alertness:lethargic AEDs during EEG study: None Technical aspects: This EEG study was done with scalp electrodes positioned according to the 10-20 International system of electrode placement. Electrical activity was acquired at a sampling rate of 500Hz  and reviewed with a high frequency filter of 70Hz  and a low frequency filter of 1Hz . EEG data were recorded continuously and digitally stored. Description: EEG showed continuous generalized 3 to 6 Hz theta-delta slowing admixed with frontocentral 112-14hz  beta activity Hyperventilation and photic stimulation were not performed.   Of note, study was technically difficult due to significant sweat artifact. 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  No seizures or epileptiform discharges were seen throughout the recording. Lora Havens   EEG adult  Result Date: 02/23/2021 Lora Havens, MD     02/23/2021  7:26 AM Patient Name: Cella Cappello MRN: [id number] Epilepsy Attending: Lora Havens Referring Physician/Provider: Dr Mitzi Hansen Date: 02/23/2021 Duration: 32.38 mins Patient history: 25yo F with ams. EEG to evaluate for seizure Level of alertness: Awake AEDs during EEG study: None Technical aspects: This EEG study was done with scalp electrodes positioned according to the 10-20 International system of electrode placement. Electrical activity was acquired at a sampling rate of 500Hz  and reviewed with a high frequency filter of 70Hz  and a  low frequency filter of 1Hz . EEG data were recorded continuously and digitally stored. Description: EEG showed continuous generalized 3 to 6 Hz theta-delta slowing. Hyperventilation and photic stimulation were not performed.   Of note, study was technically difficult due to significant myogenic artifact. ABNORMALITY - Continuous slow, generalized IMPRESSION: This technically difficult study is suggestive of moderate diffuse encephalopathy, nonspecific etiology. No seizures or epileptiform discharges were seen throughout the recording. Lora Havens   ECHOCARDIOGRAM COMPLETE  Result Date: 02/23/2021    ECHOCARDIOGRAM REPORT   Patient Name:   [facility] Date of Exam: 02/23/2021 Medical Rec #:  [id number]      Height:       64.5 in Accession #:    [id number]     Weight:       134.0 lb Date of Birth:  Jul 22, 1995      BSA:          1.659 m Patient Age:    25 years       BP:           124/93 mmHg Patient Gender: F              HR:           80 bpm. Exam Location:  Inpatient Procedure: 2D Echo, 3D Echo, Color Doppler and Cardiac Doppler STAT ECHO Indications:    Pericardial effusion I31.3  History:        Patient has no prior history of Echocardiogram examinations.                 Fever, chills, aches, lower abdominal pain. Severe sepsis, acute                 encephalopathy, acute kidney injury.  Sonographer:    Darlina Sicilian RDCS Referring Phys: [id number] [place]  1. Left ventricular ejection fraction, by estimation, is 55 to 60%. The left ventricle has normal function. The left ventricle has no regional wall motion abnormalities. There is moderate left ventricular hypertrophy of the lateral segment. Left ventricular diastolic parameters are consistent with Grade I diastolic dysfunction (impaired relaxation).  2. Right ventricular systolic function is normal. The right ventricular size is normal. There is normal pulmonary artery systolic pressure.  3. The pericardial effusion is circumferential.  There is no evidence of cardiac tamponade.  4. The mitral valve is normal in structure. Trivial mitral valve regurgitation. No evidence of mitral stenosis.  5. The aortic valve is tricuspid. Aortic valve regurgitation is not visualized. No aortic stenosis is present.  6. The inferior vena cava is normal in size with greater than 50% respiratory variability, suggesting right atrial pressure of 3 mmHg. FINDINGS  Left Ventricle: Left ventricular ejection fraction, by estimation, is 55 to 60%. The left ventricle has normal function. The left ventricle has  no regional wall motion abnormalities. The left ventricular internal cavity size was normal in size. There is  moderate left ventricular hypertrophy of the lateral segment. Left ventricular diastolic parameters are consistent with Grade I diastolic dysfunction (impaired relaxation). Indeterminate filling pressures. Right Ventricle: The right ventricular size is normal. No increase in right ventricular wall thickness. Right ventricular systolic function is normal. There is normal pulmonary artery systolic pressure. The tricuspid regurgitant velocity is 1.99 m/s, and  with an assumed right atrial pressure of 3 mmHg, the estimated right ventricular systolic pressure is 62.3 mmHg. Left Atrium: Left atrial size was normal in size. Right Atrium: Right atrial size was normal in size. Pericardium: Trivial pericardial effusion is present. The pericardial effusion is circumferential. There is no evidence of cardiac tamponade. Mitral Valve: The mitral valve is normal in structure. Trivial mitral valve regurgitation. No evidence of mitral valve stenosis. Tricuspid Valve: The tricuspid valve is normal in structure. Tricuspid valve regurgitation is trivial. No evidence of tricuspid stenosis. Aortic Valve: The aortic valve is tricuspid. Aortic valve regurgitation is not visualized. No aortic stenosis is present. Pulmonic Valve: The pulmonic valve was normal in structure. Pulmonic  valve regurgitation is trivial. No evidence of pulmonic stenosis. Aorta: The aortic root is normal in size and structure. Venous: The inferior vena cava is normal in size with greater than 50% respiratory variability, suggesting right atrial pressure of 3 mmHg. IAS/Shunts: No atrial level shunt detected by color flow Doppler.  LEFT VENTRICLE PLAX 2D LVIDd:         4.20 cm   Diastology LVIDs:         3.00 cm   LV e' medial:    8.27 cm/s LV PW:         1.20 cm   LV E/e' medial:  9.4 LV IVS:        1.20 cm   LV e' lateral:   7.40 cm/s LVOT diam:     2.20 cm   LV E/e' lateral: 10.6 LV SV:         56 LV SV Index:   34 LVOT Area:     3.80 cm  RIGHT VENTRICLE RV S prime:     11.70 cm/s TAPSE (M-mode): 2.1 cm LEFT ATRIUM             Index        RIGHT ATRIUM          Index LA diam:        2.45 cm 1.48 cm/m   RA Area:     7.56 cm LA Vol (A2C):   32.2 ml 19.41 ml/m  RA Volume:   11.20 ml 6.75 ml/m LA Vol (A4C):   27.6 ml 16.64 ml/m LA Biplane Vol: 30.7 ml 18.51 ml/m  AORTIC VALVE LVOT Vmax:   73.40 cm/s LVOT Vmean:  45.800 cm/s LVOT VTI:    0.147 m  AORTA Ao Root diam: 2.90 cm Ao Asc diam:  3.00 cm MITRAL VALVE               TRICUSPID VALVE MV Area (PHT): 5.75 cm    TR Peak grad:   15.8 mmHg MV Decel Time: 132 msec    TR Vmax:        199.00 cm/s MV E velocity: 78.10 cm/s MV A velocity: 82.80 cm/s  SHUNTS MV E/A ratio:  0.94        Systemic VTI:  0.15 m  Systemic Diam: 2.20 cm Skeet Latch MD Electronically signed by Skeet Latch MD Signature Date/Time: 02/23/2021/4:05:03 PM    Final     Principal Problem:   Severe sepsis ([place]) Active Problems:   AKI (acute kidney injury) ([place])   Acute encephalopathy   Hyponatremia   Elevated transaminase level   Axillary lymphadenopathy     Levi Crass Derek Jack, [facility]  If 7PM-7AM, please contact night-coverage [email or web address]   LOS: 2 days

## 2021-02-25 DIAGNOSIS — A419 Sepsis, unspecified organism: Secondary | ICD-10-CM | POA: Diagnosis not present

## 2021-02-25 DIAGNOSIS — R6520 Severe sepsis without septic shock: Secondary | ICD-10-CM | POA: Diagnosis not present

## 2021-02-25 LAB — COMPREHENSIVE METABOLIC PANEL
ALT: 20 U/L (ref 0–44)
AST: 67 U/L — ABNORMAL HIGH (ref 15–41)
Albumin: 2.4 g/dL — ABNORMAL LOW (ref 3.5–5.0)
Alkaline Phosphatase: 22 U/L — ABNORMAL LOW (ref 38–126)
Anion gap: 6 (ref 5–15)
BUN: 13 mg/dL (ref 6–20)
CO2: 24 mmol/L (ref 22–32)
Calcium: 8.3 mg/dL — ABNORMAL LOW (ref 8.9–10.3)
Chloride: 103 mmol/L (ref 98–111)
Creatinine, Ser: 0.62 mg/dL (ref 0.44–1.00)
GFR, Estimated: >60 mL/min (ref >60)
Glucose, Bld: 158 mg/dL — ABNORMAL HIGH (ref 70–99)
Potassium: 4.3 mmol/L (ref 3.5–5.1)
Sodium: 133 mmol/L — ABNORMAL LOW (ref 135–145)
Total Bilirubin: 0.8 mg/dL (ref 0.3–1.2)
Total Protein: 5.8 g/dL — ABNORMAL LOW (ref 6.5–8.1)

## 2021-02-25 LAB — CBC WITH DIFFERENTIAL/PLATELET
Abs Immature Granulocytes: 0.04 10*3/uL (ref 0.00–0.07)
Basophils Absolute: 0 10*3/uL (ref 0.0–0.1)
Basophils Relative: 0 %
Eosinophils Absolute: 0 10*3/uL (ref 0.0–0.5)
Eosinophils Relative: 0 %
HCT: 25.6 % — ABNORMAL LOW (ref 36.0–46.0)
Hemoglobin: 8.8 g/dL — ABNORMAL LOW (ref 12.0–15.0)
Immature Granulocytes: 1 %
Lymphocytes Relative: 5 %
Lymphs Abs: 0.3 10*3/uL — ABNORMAL LOW (ref 0.7–4.0)
MCH: 26 pg (ref 26.0–34.0)
MCHC: 34.4 g/dL (ref 30.0–36.0)
MCV: 75.5 fL — ABNORMAL LOW (ref 80.0–100.0)
Monocytes Absolute: 0.2 10*3/uL (ref 0.1–1.0)
Monocytes Relative: 3 %
Neutro Abs: 6.1 10*3/uL (ref 1.7–7.7)
Neutrophils Relative %: 91 %
Platelets: 115 10*3/uL — ABNORMAL LOW (ref 150–400)
RBC: 3.39 MIL/uL — ABNORMAL LOW (ref 3.87–5.11)
RDW: 13.9 % (ref 11.5–15.5)
WBC: 6.7 10*3/uL (ref 4.0–10.5)
nRBC: 0 % (ref 0.0–0.2)

## 2021-02-25 LAB — ENA+DNA/DS+ANTICH+CENTRO+JO...
Anti JO-1: <0.2 AI (ref 0.0–0.9)
Centromere Ab Screen: <0.2 AI (ref 0.0–0.9)
Chromatin Ab SerPl-aCnc: <0.2 AI (ref 0.0–0.9)
ENA SM Ab Ser-aCnc: <0.2 AI (ref 0.0–0.9)
Ribonucleic Protein: 0.2 AI (ref 0.0–0.9)
SSA (Ro) (ENA) Antibody, IgG: >8 AI — ABNORMAL HIGH (ref 0.0–0.9)
SSB (La) (ENA) Antibody, IgG: 0.4 AI (ref 0.0–0.9)
Scleroderma (Scl-70) (ENA) Antibody, IgG: <0.2 AI (ref 0.0–0.9)
ds DNA Ab: 1 IU/mL (ref 0–9)

## 2021-02-25 LAB — SJOGRENS SYNDROME-B EXTRACTABLE NUCLEAR ANTIBODY: SSB (La) (ENA) Antibody, IgG: 0.4 AI (ref 0.0–0.9)

## 2021-02-25 LAB — GC/CHLAMYDIA PROBE AMP (~~LOC~~) NOT AT ARMC
Chlamydia: NEGATIVE
Comment: NEGATIVE
Comment: NORMAL
Neisseria Gonorrhea: NEGATIVE

## 2021-02-25 LAB — HSV 1/2 PCR, CSF
HSV-1 DNA: NEGATIVE
HSV-2 DNA: NEGATIVE

## 2021-02-25 LAB — ANA W/REFLEX IF POSITIVE: Anti Nuclear Antibody (ANA): POSITIVE — AB

## 2021-02-25 LAB — MAGNESIUM: Magnesium: 2 mg/dL (ref 1.7–2.4)

## 2021-02-25 LAB — CK: Total CK: 1263 U/L — ABNORMAL HIGH (ref 38–234)

## 2021-02-25 LAB — VANCOMYCIN, TROUGH: Vancomycin Tr: 9 ug/mL — ABNORMAL LOW (ref 15–20)

## 2021-02-25 LAB — SJOGRENS SYNDROME-A EXTRACTABLE NUCLEAR ANTIBODY: SSA (Ro) (ENA) Antibody, IgG: >8 AI — ABNORMAL HIGH (ref 0.0–0.9)

## 2021-02-25 MED ORDER — VANCOMYCIN HCL 500 MG/100ML IV SOLN
500.0000 mg | Freq: Three times a day (TID) | INTRAVENOUS | Status: DC
  Administered 2021-02-25 – 2021-02-26 (×4): 500 mg via INTRAVENOUS
  Filled 2021-02-25 (×4): qty 100

## 2021-02-25 NOTE — Progress Notes (Signed)
Inpatient Rehab Admissions Coordinator:   Per therapy recommendations, patient was screened for CIR candidacy by Clemens Catholic, MS, CCC-SLP. At this time, Pt. is not yet tolerating transfers OOB and it is not clear that she can tolerate the intensity of CIR. However,  Pt. may have potential to progress to becoming a potential CIR candidate, so CIR admissions team will follow and monitor for progress and participation with therapies and place consult order if Pt. appears to be an appropriate candidate. Please contact me with any questions.   Clemens Catholic, [place], [place] Admissions Coordinator  [phone] ([place]) [phone] (office)

## 2021-02-25 NOTE — Progress Notes (Addendum)
Pharmacy Antibiotic Note  Erin French is a 26 y.o. female admitted on 02/22/2021 with sepsis.  Pharmacy has been consulted for Vancomcyin and Acyclovir dosing. Vanc trough subtherapeutic at 9 this morning. Will increase vanc dosing per nomogram recommendations.  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 - LR at 150 ml/hr running  Height: 5' 4.5" (163.8 cm) Weight: 60.7 kg (133 lb 13.1 oz) IBW/kg (Calculated) : 55.85  Temp (24hrs), Avg:97.8 F (36.6 C), Min:97.5 F (36.4 C), Max:98.1 F (36.7 C)  Recent Labs  Lab 02/22/21 1345 02/22/21 1346 02/22/21 1447 02/22/21 1519 02/23/21 0741 02/24/21 0243 02/25/21 0031 02/25/21 0848  WBC  --  4.9  --   --  6.6 6.7 6.7  --   CREATININE  --  1.40* 1.20*  --  1.03* 0.70 0.62  --   LATICACIDVEN 1.5  --   --  1.4  --   --   --   --   VANCOTROUGH  --   --   --   --   --   --   --  9*    Estimated Creatinine Clearance: 94.9 mL/min (by C-G formula based on SCr of 0.62 mg/dL).    No Known Allergies  Antimicrobials this admission: metronidazole 12/31  cefepime 12/31 ceftriaxone 12/31 >> vancomycin 12/31 >>  acyclovir 12/31 >>   Decadron 12/31 >>  Microbiology results: 12/31 CSF: < 30 glucose, WBC 19, TP 120 12/31 CSF Cx: NGTD, few wbc 12/31 Ucx: mult species, needs recollection 12/31 Bcx: NGTD   Thank you for allowing pharmacy to be a part of this patients care.  Ardyth Harps, PharmD Clinical Pharmacist

## 2021-02-25 NOTE — Evaluation (Signed)
Physical Therapy Evaluation Patient Details Name: Erin French MRN: [id number] DOB: 03-10-1995 Today's Date: 02/25/2021  History of Present Illness  26 y.o. female presenting to the emergency department 02/22/21 with altered mental status. EEG moderate diffuse encephalopathy; possible UTI; viral meningitis per Neuro and ID consults   PMH significant for chlamydia  Clinical Impression   Pt admitted secondary to problem above with deficits below. PTA patient was independent with mobility and living in [place] townhouse with her partner and 2 young children.  Pt currently requires min assist to maintain sitting balance due to truncal ataxia (and noted ataxia in extremities). She was not safe to attempt standing due to symptomatic orthostasis (supine BP 120/76; seated 95/56; returned to supine 104/68). Patient is difficult to understand when talking and states it feels like the muscles for speaking "won't work." Recommend OT, SLP and Acute Inpatient Rehab consults. Anticipate patient will benefit from PT to address problems listed below.Will continue to follow acutely to maximize functional mobility independence and safety.          Recommendations for follow up therapy are one component of a multi-disciplinary discharge planning process, led by the attending physician.  Recommendations may be updated based on patient status, additional functional criteria and insurance authorization.  Follow Up Recommendations Acute inpatient rehab (3hours/day)    Assistance Recommended at Discharge Frequent or constant Supervision/Assistance  Patient can return home with the following       Equipment Recommendations Rolling walker (2 wheels)  Recommendations for Other Services  Rehab consult;OT consult;Speech consult    Functional Status Assessment Patient has had a recent decline in their functional status and demonstrates the ability to make significant improvements in function in a reasonable and  predictable amount of time.     Precautions / Restrictions Precautions Precautions: Fall Precaution Comments: pt reports some falls at home PTA Restrictions Weight Bearing Restrictions: No      Mobility  Bed Mobility Overal bed mobility: Needs Assistance Bed Mobility: Supine to Sit;Sit to Supine     Supine to sit: Min assist Sit to supine: Min assist   General bed mobility comments: difficulty with balance due to ataxia and dizziness; assist to raise legs back onto bed    Transfers                   General transfer comment: unable to attempt due to symptomatic orthostasis with supine to sit    Ambulation/Gait                  Stairs            Wheelchair Mobility    Modified Rankin (Stroke Patients Only)       Balance Overall balance assessment: Needs assistance Sitting-balance support: Bilateral upper extremity supported;Feet supported Sitting balance-Leahy Scale: Poor Sitting balance - Comments: trunk ataxia noted, especially with hands placed on knees Postural control: Posterior lean;Right lateral lean;Left lateral lean                                   Pertinent Vitals/Pain Pain Assessment: No/denies pain    Home Living Family/patient expects to be discharged to:: Private residence Living Arrangements: Spouse/significant other;Children (partner, Jazmin; 2 and 77 yo children) Available Help at Discharge: Family;Available 24 hours/day Type of Home: House Home Access: Level entry       Home Layout: One level Home Equipment: None Additional Comments: home set-up is for  partner's mother's home (where Erin French plans to go when leaves hospital due to # steps at her townhouse)    Prior Function Prior Level of Function : Independent/Modified Independent                     Hand Dominance        Extremity/Trunk Assessment   Upper Extremity Assessment Upper Extremity Assessment: RUE deficits/detail;LUE  deficits/detail (ataxic movements when reaching)    Lower Extremity Assessment Lower Extremity Assessment: Generalized weakness (ataxic movments noted)    Cervical / Trunk Assessment Cervical / Trunk Assessment: Other exceptions Cervical / Trunk Exceptions: ataxia  Communication   Communication: Expressive difficulties (difficult to understand and says muscles won't work right (denies swallowing issues))  Cognition Arousal/Alertness: Awake/alert Behavior During Therapy: Flat affect Overall Cognitive Status: Within Functional Limits for tasks assessed                                          General Comments General comments (skin integrity, edema, etc.): Partner, Jazmin, present throughout session. Able to provide home set-up information as difficult to understand Samary    Exercises General Exercises - Lower Extremity Ankle Circles/Pumps: AROM;Both;10 reps Other Exercises Other Exercises: Educated to later elevate HOB fully to "exercise" her BP. Also to do ankle pumps when she elevates HOB.   Assessment/Plan    PT Assessment Patient needs continued PT services  PT Problem List Decreased strength;Decreased activity tolerance;Decreased balance;Decreased mobility;Decreased coordination;Decreased knowledge of use of DME;Decreased knowledge of precautions;Cardiopulmonary status limiting activity       PT Treatment Interventions DME instruction;Gait training;Stair training;Functional mobility training;Therapeutic activities;Therapeutic exercise;Balance training;Neuromuscular re-education;Patient/family education    PT Goals (Current goals can be found in the Care Plan section)  Acute Rehab PT Goals Patient Stated Goal: Get better and return home to kids PT Goal Formulation: With patient Time For Goal Achievement: 03/11/21 Potential to Achieve Goals: Good    Frequency Min 4X/week     Co-evaluation               AM-PAC PT "6 Clicks" Mobility  Outcome  Measure Help needed turning from your back to your side while in a flat bed without using bedrails?: None Help needed moving from lying on your back to sitting on the side of a flat bed without using bedrails?: A Little Help needed moving to and from a bed to a chair (including a wheelchair)?: Total Help needed standing up from a chair using your arms (e.g., wheelchair or bedside chair)?: Total Help needed to walk in hospital room?: Total Help needed climbing 3-5 steps with a railing? : Total 6 Click Score: 11    End of Session   Activity Tolerance: Treatment limited secondary to medical complications (Comment) (orthostasis) Patient left: in bed;with call bell/phone within reach;with family/visitor present Nurse Communication: Other (comment) (orthostasis; ataxia) PT Visit Diagnosis: Other symptoms and signs involving the nervous system (R29.898);Dizziness and giddiness (R42)    Time: 4166-0630 PT Time Calculation (min) (ACUTE ONLY): 31 min   Charges:   PT Evaluation $PT Eval High Complexity: 1 High PT Treatments $Therapeutic Activity: 8-22 mins         Arby Barrette, PT Acute Rehabilitation Services  Pager [phone] Office [phone]   Rexanne Mano 02/25/2021, 3:31 PM

## 2021-02-25 NOTE — Progress Notes (Signed)
PROGRESS NOTE    Erin French  NLZ:[id number]  DOB: April 25, 1995  DOA: 02/22/2021 PCP: Maryellen Pile, MD (Inactive) Outpatient Specialists:   Hospital course:  26 year old female with history of chlamydia was admitted last night with altered mental status, fevers chills and lower abdominal pain.  Patient had extensive work-up as she was unable to provide a history.  Work-up was notable for CSF consistent with likely viral meningitis and possible UTI.  Also noted to have UDS positive only for THC, CPK 6000 and normal lactate.  COVID and influenza were negative.  Head CT was negative.  Patient was started on broad-spectrum antibiotics including ceftriaxone, acyclovir to cover meningitis as well as vancomycin and Flagyl.   Subjective:  Patient was sleeping when I went in, her partner was sleeping in the recliner at bedside.  Patient was slow to arouse but when she did wake up she was able to speak with me albeit with slowed cognition and speech but she was coherent.  She thought she was doing okay.  She did know that the person next to her was called Erin French.  She did know she was in the hospital.  She is not entirely sure why she is here.  Objective: Vitals:   02/25/21 0758 02/25/21 0802 02/25/21 1148 02/25/21 1544  BP: (!) 107/56 (!) 133/59 111/65 (!) 104/58  Pulse: 83 67 91 89  Resp: 16 16 16 16   Temp: 97.9 F (36.6 C) 97.6 F (36.4 C) 98.2 F (36.8 C) 98.2 F (36.8 C)  TempSrc: Oral Oral Oral Oral  SpO2: 96% 96% 98% 98%  Weight:      Height:        Intake/Output Summary (Last 24 hours) at 02/25/2021 1625 Last data filed at 02/25/2021 0437 Gross per 24 hour  Intake 1345.71 ml  Output 825 ml  Net 520.71 ml    Filed Weights   02/22/21 1323 02/24/21 0500 02/25/21 0500  Weight: 60.8 kg 60.6 kg 60.7 kg     Exam:  General: Thin female sleeping comfortably, slow to arouse but oriented x3 when she did wake up..   Eyes: sclera anicteric, conjuctiva with significant  injection bilaterally  CVS: S1-S2, tachycardic, 2/6 murmur. Respiratory: Good air entry bilaterally with transmitted upper respiratory sounds  GI: NABS, soft, nontender  LE: No edema.  Neuro: Mental status is somewhat improved since yesterday, however she continues to have slow cognition, slow speech and bradykinesia.  Assessment & Plan:   26 year old female was admitted with fever and altered mental status with work-up suggestive of possible viral meningitis vs primary presentation of autoimmune disease.  Altered mental status with fever followed by hypothermia/severe sepsis Appreciate neurology and ID consultations--this seems to be a viral meningitis Patient continues to improve slowly over the past 24 hours. Will continue present course of management per ID and neurology Continue ceftriaxone, vancomycin until CSF cultures are finalized Continue acyclovir until HSV CSF studies are finalized Continue Decadron until autoimmune work-up returns Still pending: VDRL, RPR, HIV-RNA, IGRA,VZV PCR CSF, Lyme panel  ANA, DS ANA, ESR and CRP also pending  Hypothermia Patient remains on Bair hugger with temperature at 97 and stable  R/O Addison's disease Given low sodium and higher potassium, there is concern for possible adrenal insufficiency However patient is already been started on Decadron so cannot test accurately Will continue dexamethasone 10 every 6 hours per PCCM recommendation Follow sodium and potassium  AKI in setting of mild elevation of CPK Resolved with hydration Follow CPK  Axillary adenopathy  Patient can have diagnostic mammogram if warranted as an outpatient   DVT prophylaxis: Lovenox Code Status: Full Family Communication: Spoke with patient's partner Erin French who was at bedside, patient's children are with Erin French's mother Erin French who is listed as next of kin. disposition Plan:   Patient is from: Home  Anticipated Discharge Location: Home  Barriers to  Discharge: Acutely ill and septic  Is patient medically stable for Discharge: No   Scheduled Meds:  Chlorhexidine Gluconate Cloth  6 each Topical Daily   dexamethasone (DECADRON) injection  10 mg Intravenous Q6H   enoxaparin (LOVENOX) injection  40 mg Subcutaneous Q24H   sodium chloride flush  3 mL Intravenous Q12H   Continuous Infusions:  acyclovir 610 mg (02/25/21 1343)   cefTRIAXone (ROCEPHIN)  IV 2 g (02/25/21 0956)   lactated ringers 150 mL/hr at 02/25/21 0437   vancomycin 500 mg (02/25/21 1025)    Data Reviewed:  Basic Metabolic Panel: Recent Labs  Lab 02/22/21 1346 02/22/21 1447 March 05, 2021 0741 03-05-21 0851 02/24/21 0243 02/25/21 0031  NA 127* 130*   130* 131* 133* 133* 133*  K 4.3 4.5   4.5 5.2* 4.7 4.6 4.3  CL 97* 98 101  --  102 103  CO2 20*  --  21*  --  22 24  GLUCOSE 106* 97 116*  --  116* 158*  BUN 19 21* 14  --  12 13  CREATININE 1.40* 1.20* 1.03*  --  0.70 0.62  CALCIUM 8.3*  --  8.2*  --  8.3* 8.3*  MG  --   --  2.0  --   --  2.0  PHOS  --   --  3.7  --   --   --      CBC: Recent Labs  Lab 02/22/21 1346 02/22/21 1447 Mar 05, 2021 0741 2021/03/05 0851 02/24/21 0243 02/25/21 0031  WBC 4.9  --  6.6  --  6.7 6.7  NEUTROABS 4.3  --  6.3  --  6.1 6.1  HGB 11.2* 12.2   12.9 9.1* 10.5* 9.4* 8.8*  HCT 33.2* 36.0   38.0 27.9* 31.0* 27.7* 25.6*  MCV 77.6*  --  78.6*  --  75.7* 75.5*  PLT 154  --  PLATELET CLUMPS NOTED ON SMEAR, UNABLE TO ESTIMATE  --  141* 115*     Studies: EEG adult  Result Date: Mar 05, 2021 Lora Havens, MD     2021-03-05  4:59 PM Patient Name: Erin French MRN: [id number] Epilepsy Attending: Lora Havens Referring Physician/Provider: Donita Brooks, NP Date: 2021/03/05 Duration: 22.13 mins Patient history: 25yo F with ams. EEG to evaluate for seizure Level of alertness:lethargic AEDs during EEG study: None Technical aspects: This EEG study was done with scalp electrodes positioned according to the 10-20 International system of  electrode placement. Electrical activity was acquired at a sampling rate of 500Hz  and reviewed with a high frequency filter of 70Hz  and a low frequency filter of 1Hz . EEG data were recorded continuously and digitally stored. Description: EEG showed continuous generalized 3 to 6 Hz theta-delta slowing admixed with frontocentral 112-14hz  beta activity Hyperventilation and photic stimulation were not performed.   Of note, study was technically difficult due to significant sweat artifact. 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 No seizures or epileptiform discharges were seen throughout the recording. Erin French    Principal Problem:   Severe sepsis ([place]) Active Problems:   AKI (  acute kidney injury) ([place])   Acute encephalopathy   Hyponatremia   Elevated transaminase level   Axillary lymphadenopathy     Erin French Derek Jack, [facility]  If 7PM-7AM, please contact night-coverage [email or web address]   LOS: 3 days

## 2021-02-25 NOTE — Consult Note (Signed)
[facility]    Date of Admission:  02/22/2021   Total days of inpatient antibiotics 4        Reason for Consult: Acute encephalopathy    Principal Problem:   Severe sepsis ([place]) Active Problems:   AKI (acute kidney injury) ([place])   Acute encephalopathy   Hyponatremia   Elevated transaminase level   Axillary lymphadenopathy   Assessment: 26 YF with history of chlamydia(last episode 2 years ago), recent ED visit for possible viral URI(COVID/flu negative) admitted with fever and altered mental status. She underwent LP with CSF studies showing neutrophil predominant pleocytosis, PTN 120, GLC 30. She is on empiric meningitis coverage with vancomycin , ceftriaxone and acyclovir. Neurology recommend ID consult given concern for viral meningitis in the setting of neutrophilic predominant pleocytosis with mild hypoglycorrhachia.  #Altered mental status #Pleocytosis with neutrophil predominance -CSF cell count: 7 rbc, 19 wbc, 77% neutrophils, 21% lymphs, 2% mono, 120 PTN, 30 GLC. CK on admission 6, 127->3,292. -Etiology: Given her work-up CSF studies included 120 protein and signs  of meningitis(stiff neck/fever/photophobia) her clinical picture is most consistent with viral meninginitis. She denies Hx of oral or genital lesion, but HSV meningitis can occur despite history of lesions. Will add VZV PCR, she does have two children (2 and 5) but neither have been ill.  As Csf Cx are negative so far suspect bacterial meningitis is less likely.  -Other possible infectious etiology include: TB meningitis(unlikely as she has no Hx of exposure or risk factors), neurosyphilis(she does not report any recent exposures-RPR non reactive in 2019). Autoimmune work-up is pending -Seen by neurology, ordered Lyme PCR CSF. Pt denies any tick exposure/rashes.  Recommendations:  -Continue vancomycin and ceftriaxone until CSF Cx finalize -Continue acyclovir while awaiting HSV CSF  studies -Follow-up VDRL, RPR, HIV-RNA, IGRA -Ordered VZV PCR CSF -Lyme panel and autoimmune work-up pending Microbiology:   Antibiotics: Acyclovir 12/31-p Cefepime 12/31 Metronidazole 12/31 Ceftriaxone 12/31-p Vancomycin 12/31-p  Cultures: Blood 12/31-p  Urine 12/31: multiple species  CSF 12/31 NGTD x 3, Gram stain:few wbc, no organisms seen   HPI: Erin French is a 26 y.o. female with history of chlamydia, ED visit on 12/24 for viral syndrome with fever and cough(COVID/flu negative) admitted for altered mental status. On arrival to the ED, she had a temp of 102.9, wbc 4.9K. CT head was negative, CT chest abdomen pelvis showed distended urinary bladder with mild b/l hydronephrosis. She underwent LP. Started on empiric meningitis coverage with vancomycin, ceftriaxone and acyclovir. Neurology consulted and recommended ID involvement given neutrophilic predominant pleocytosis and Glc 30. concerning for viral meningitis.    Review of Systems: Review of Systems  All other systems reviewed and are negative.  Past Medical History:  Diagnosis Date   Chlamydia 4/12    Social History   Tobacco Use   Smoking status: Never   Smokeless tobacco: Never  Vaping Use   Vaping Use: Never used  Substance Use Topics   Alcohol use: Not Currently    Alcohol/week: 0.0 standard drinks    Comment: Last drink December 2019   Drug use: No    Family History  Problem Relation Age of Onset   Hypertension Father    Schizophrenia Mother    Hypertension Maternal Grandmother    Scheduled Meds:  Chlorhexidine Gluconate Cloth  6 each Topical Daily   dexamethasone (DECADRON) injection  10 mg Intravenous Q6H   enoxaparin (LOVENOX) injection  40 mg Subcutaneous Q24H  sodium chloride flush  3 mL Intravenous Q12H   Continuous Infusions:  acyclovir 610 mg (02/25/21 0651)   cefTRIAXone (ROCEPHIN)  IV 2 g (02/25/21 0956)   lactated ringers 150 mL/hr at 02/25/21 0437   vancomycin 500 mg  (02/25/21 1025)   PRN Meds:.acetaminophen **OR** acetaminophen, lip balm, ondansetron **OR** ondansetron (ZOFRAN) IV No Known Allergies  OBJECTIVE: Blood pressure 111/65, pulse 91, temperature 98.2 F (36.8 C), temperature source Oral, resp. rate 16, height 5' 4.5" (1.638 m), weight 60.7 kg, SpO2 98 %, currently breastfeeding.  Physical Exam Constitutional:      Appearance: Normal appearance.  HENT:     Head: Normocephalic and atraumatic.     Right Ear: Tympanic membrane normal.     Left Ear: Tympanic membrane normal.     Nose: Nose normal.     Mouth/Throat:     Mouth: Mucous membranes are moist.  Eyes:     Extraocular Movements: Extraocular movements intact.     Pupils: Pupils are equal, round, and reactive to light.     Comments: B/l injected conjunctiva  Cardiovascular:     Rate and Rhythm: Normal rate and regular rhythm.     Heart sounds: No murmur heard.   No friction rub. No gallop.  Pulmonary:     Effort: Pulmonary effort is normal.     Breath sounds: Normal breath sounds.  Abdominal:     General: Abdomen is flat.     Palpations: Abdomen is soft.  Musculoskeletal:     Comments: Neck is extended, able to do full ROM but it is painful  Skin:    General: Skin is warm and dry.  Neurological:     General: No focal deficit present.     Mental Status: She is alert and oriented to person, place, and time.  Psychiatric:        Mood and Affect: Mood normal.    Lab Results Lab Results  Component Value Date   WBC 6.7 02/25/2021   HGB 8.8 (L) 02/25/2021   HCT 25.6 (L) 02/25/2021   MCV 75.5 (L) 02/25/2021   PLT 115 (L) 02/25/2021    Lab Results  Component Value Date   CREATININE 0.62 02/25/2021   BUN 13 02/25/2021   NA 133 (L) 02/25/2021   K 4.3 02/25/2021   CL 103 02/25/2021   CO2 24 02/25/2021    Lab Results  Component Value Date   ALT 20 02/25/2021   AST 67 (H) 02/25/2021   ALKPHOS 22 (L) 02/25/2021   BILITOT 0.8 02/25/2021       Laurice Record,  MD [facility] 02/25/2021, 12:23 PM

## 2021-02-25 NOTE — Plan of Care (Signed)
Neurology plan of care  Please see consult note from overnight for full findings and recommendations. ID consult is pending, further mgmt per their recommendations of suspected viral meningitis. Patient slowly improving on current regimen. Primary team please f/u on Lyme PCR CSF. Neurology will be available on an as needed basis for any further questions.  Su Monks, MD [facility] [phone]  If 7pm- 7am, please page neurology on call as listed in [place].

## 2021-02-26 ENCOUNTER — Inpatient Hospital Stay (HOSPITAL_COMMUNITY): Payer: Medicaid Other

## 2021-02-26 DIAGNOSIS — R6520 Severe sepsis without septic shock: Secondary | ICD-10-CM | POA: Diagnosis not present

## 2021-02-26 DIAGNOSIS — A419 Sepsis, unspecified organism: Secondary | ICD-10-CM | POA: Diagnosis not present

## 2021-02-26 LAB — CBC WITH DIFFERENTIAL/PLATELET
Abs Immature Granulocytes: 0.04 10*3/uL (ref 0.00–0.07)
Basophils Absolute: 0 10*3/uL (ref 0.0–0.1)
Basophils Relative: 0 %
Eosinophils Absolute: 0 10*3/uL (ref 0.0–0.5)
Eosinophils Relative: 0 %
HCT: 25.5 % — ABNORMAL LOW (ref 36.0–46.0)
Hemoglobin: 8.4 g/dL — ABNORMAL LOW (ref 12.0–15.0)
Immature Granulocytes: 1 %
Lymphocytes Relative: 5 %
Lymphs Abs: 0.4 10*3/uL — ABNORMAL LOW (ref 0.7–4.0)
MCH: 25.3 pg — ABNORMAL LOW (ref 26.0–34.0)
MCHC: 32.9 g/dL (ref 30.0–36.0)
MCV: 76.8 fL — ABNORMAL LOW (ref 80.0–100.0)
Monocytes Absolute: 0.2 10*3/uL (ref 0.1–1.0)
Monocytes Relative: 3 %
Neutro Abs: 6.8 10*3/uL (ref 1.7–7.7)
Neutrophils Relative %: 91 %
Platelets: 151 10*3/uL (ref 150–400)
RBC: 3.32 MIL/uL — ABNORMAL LOW (ref 3.87–5.11)
RDW: 14 % (ref 11.5–15.5)
WBC: 7.3 10*3/uL (ref 4.0–10.5)
nRBC: 0 % (ref 0.0–0.2)

## 2021-02-26 LAB — CSF CULTURE W GRAM STAIN: Culture: NO GROWTH

## 2021-02-26 LAB — HIV-1 RNA QUANT-NO REFLEX-BLD
HIV 1 RNA Quant: <20 copies/mL
LOG10 HIV-1 RNA: UNDETERMINED log10copy/mL

## 2021-02-26 LAB — BASIC METABOLIC PANEL
Anion gap: 6 (ref 5–15)
BUN: 11 mg/dL (ref 6–20)
CO2: 24 mmol/L (ref 22–32)
Calcium: 8.2 mg/dL — ABNORMAL LOW (ref 8.9–10.3)
Chloride: 103 mmol/L (ref 98–111)
Creatinine, Ser: 0.7 mg/dL (ref 0.44–1.00)
GFR, Estimated: >60 mL/min (ref >60)
Glucose, Bld: 155 mg/dL — ABNORMAL HIGH (ref 70–99)
Potassium: 3.9 mmol/L (ref 3.5–5.1)
Sodium: 133 mmol/L — ABNORMAL LOW (ref 135–145)

## 2021-02-26 LAB — ANCA TITERS
Atypical P-ANCA titer: <1:20 {titer}
C-ANCA: <1:20 {titer}
P-ANCA: <1:20 {titer}

## 2021-02-26 LAB — VDRL, CSF: VDRL Quant, CSF: NONREACTIVE

## 2021-02-26 LAB — PATHOLOGIST SMEAR REVIEW: Path Review: INCREASED

## 2021-02-26 LAB — RPR: RPR Ser Ql: NONREACTIVE

## 2021-02-26 LAB — CK: Total CK: 535 U/L — ABNORMAL HIGH (ref 38–234)

## 2021-02-26 LAB — SEDIMENTATION RATE: Sed Rate: 11 mm/hr (ref 0–22)

## 2021-02-26 NOTE — Progress Notes (Signed)
Inpatient Rehab Admissions Coordinator:  ? ?Per therapy recommendations,  patient was screened for CIR candidacy by Tanza Pellot, MS, CCC-SLP. At this time, Pt. Appears to be a a potential candidate for CIR. I will place   order for rehab consult per protocol for full assessment. Please contact me any with questions. ? ?Arthea Nobel, MS, CCC-SLP ?Rehab Admissions Coordinator  ?[phone] (celll) ?[phone] (office) ? ?

## 2021-02-26 NOTE — Progress Notes (Signed)
Physical Therapy Treatment Patient Details Name: Erin French MRN: [id number] DOB: 1995-10-01 Today's Date: 02/26/2021   History of Present Illness 26 y.o. female presenting to the emergency department 02/22/21 with altered mental status. EEG moderate diffuse encephalopathy; possible UTI; viral meningitis per Neuro and ID consults   PMH significant for chlamydia    PT Comments    Patient continues to be limited by orthostasis, ataxia, and weakness. Was able to transfer to [facility] and back to bed with max assist and +2 for lines/safety. Noted MRI ordered for ?autoimmune encephalitis. Will order OT consult. Continue to also recommend SLP for language and cognitive evals.     Recommendations for follow up therapy are one component of a multi-disciplinary discharge planning process, led by the attending physician.  Recommendations may be updated based on patient status, additional functional criteria and insurance authorization.  Follow Up Recommendations  Acute inpatient rehab (3hours/day)     Assistance Recommended at Discharge Frequent or constant Supervision/Assistance  Patient can return home with the following Two people to help with walking and/or transfers;Assistance with cooking/housework;Assist for transportation;Help with stairs or ramp for entrance   Equipment Recommendations  Wheelchair (measurements PT);Wheelchair cushion (measurements PT);Hospital bed;BSC/3in1    Recommendations for Other Services Rehab consult;OT consult;Speech consult     Precautions / Restrictions Precautions Precautions: Fall Precaution Comments: pt reports some falls at home PTA     Mobility  Bed Mobility Overal bed mobility: Needs Assistance Bed Mobility: Sit to Supine       Sit to supine: Min assist   General bed mobility comments: sitting EOB with RN on arrival; return to supine min assist for guiding torso (due to orthostasis/dizziness) and raising 2nd leg onto bed    Transfers Overall  transfer level: Needs assistance Equipment used: None Transfers: Sit to/from Stand;Bed to chair/wheelchair/BSC Sit to Stand: Mod assist     Step pivot transfers: Max assist     General transfer comment: +ataxic movements of trunk and LEs as trying to step around; overall weak and +orthostasis; assisted on/off BSC with RN present for lines    Ambulation/Gait               General Gait Details: unable due to weakness, ataxia, and orthostasis   Stairs             Wheelchair Mobility    Modified Rankin (Stroke Patients Only)       Balance Overall balance assessment: Needs assistance Sitting-balance support: Bilateral upper extremity supported;Feet supported Sitting balance-Leahy Scale: Poor Sitting balance - Comments: trunk ataxia noted, especially with hands placed on knees Postural control: Posterior lean;Right lateral lean;Left lateral lean Standing balance support: Bilateral upper extremity supported;During functional activity Standing balance-Leahy Scale: Zero                              Cognition Arousal/Alertness: Awake/alert Behavior During Therapy: Flat affect Overall Cognitive Status: Within Functional Limits for tasks assessed                                 General Comments: following commands; further assessment NT        Exercises Other Exercises Other Exercises: Educated to later elevate HOB fully to "exercise" her BP. Also to do ankle pumps when she elevates HOB. Partner and RN present for education    General Comments General comments (skin integrity, edema, etc.): see  vitals flowsheet for BPs      Pertinent Vitals/Pain Pain Assessment: No/denies pain    Home Living                          Prior Function            PT Goals (current goals can now be found in the care plan section) Acute Rehab PT Goals Patient Stated Goal: Get better and return home to kids Time For Goal Achievement:  03/11/21 Potential to Achieve Goals: Good Progress towards PT goals: Progressing toward goals    Frequency    Min 4X/week      PT Plan Current plan remains appropriate    Co-evaluation              AM-PAC PT "6 Clicks" Mobility   Outcome Measure  Help needed turning from your back to your side while in a flat bed without using bedrails?: None Help needed moving from lying on your back to sitting on the side of a flat bed without using bedrails?: A Little Help needed moving to and from a bed to a chair (including a wheelchair)?: Total Help needed standing up from a chair using your arms (e.g., wheelchair or bedside chair)?: Total Help needed to walk in hospital room?: Total Help needed climbing 3-5 steps with a railing? : Total 6 Click Score: 11    End of Session   Activity Tolerance: Treatment limited secondary to medical complications (Comment) (orthostasis) Patient left: in bed;with call bell/phone within reach;with family/visitor present;with bed alarm set;with nursing/sitter in room Nurse Communication: Other (comment);Mobility status (orthostasis; ataxia) PT Visit Diagnosis: Other symptoms and signs involving the nervous system (R29.898);Dizziness and giddiness (R42)     Time: 2094-7096 PT Time Calculation (min) (ACUTE ONLY): 16 min  Charges:  $Therapeutic Activity: 8-22 mins                      Erin French, PT Acute Rehabilitation Services  Pager [phone] Office [phone]    Erin French 02/26/2021, 11:32 AM

## 2021-02-26 NOTE — Progress Notes (Signed)
Brief Neurology Update  D/w ID today who re-engaged neurology 2/2 c/f autoimmune encephalitis. I called the lab and they did have additional CSF that we sent to [facility] clinic for autoimmune encephalitis panel. I also sent off serum panel for same. She is awaiting MRI brain wwo. After reviewing results I will discuss w/ patient in AM with tentative plan to start IV solumedrol tmrw for empiric tx autoimmune encephalitis (ID ok with this).  Will f/u tmrw  Su Monks, MD [facility] [phone]  If 7pm- 7am, please page neurology on call as listed in [place].

## 2021-02-26 NOTE — Progress Notes (Signed)
PROGRESS NOTE    Erin French  JAS:[id number]  DOB: 04-Aug-1995  DOA: 02/22/2021 PCP: Karleen Dolphin, MD (Inactive) Outpatient Specialists:   Hospital course:  26 year old female with history of chlamydia was admitted last night with altered mental status, fevers chills and lower abdominal pain.  Patient had extensive work-up as she was unable to provide a history.  Work-up was notable for CSF consistent with likely viral meningitis and possible UTI.  Also noted to have UDS positive only for THC, CPK 6000 and normal lactate.  COVID and influenza were negative.  Head CT was negative.  Patient was started on broad-spectrum antibiotics including ceftriaxone, acyclovir to cover meningitis as well as vancomycin and Flagyl.   Subjective:  Sleeping in bed in no discomfort, denies any headache or focal weakness.  Says she feels better.  Objective: Vitals:   02/26/21 0000 02/26/21 0500 02/26/21 0740 02/26/21 1204  BP: 117/75  106/64 120/72  Pulse: 78  85 83  Resp: (!) 22 15 20 16   Temp: 98.1 F (36.7 C)  98.3 F (36.8 C) 98.5 F (36.9 C)  TempSrc: Oral  Oral Oral  SpO2: 96%  93% 95%  Weight:  55.2 kg    Height:        Intake/Output Summary (Last 24 hours) at 02/26/2021 1342 Last data filed at 02/26/2021 1100 Gross per 24 hour  Intake 2737.11 ml  Output 1150 ml  Net 1587.11 ml   Filed Weights   02/24/21 0500 02/25/21 0500 02/26/21 0500  Weight: 60.6 kg 60.7 kg 55.2 kg     Exam:  Sleeping, woke up after much deliberation, girlfriend by the bedside helped and waking her up, denies any headache, no focal weakness, flat affect .AT,PERRAL Supple Neck, No JVD,   Symmetrical Chest wall movement, Good air movement bilaterally, CTAB RRR,No Gallops, Rubs or new Murmurs,  +ve B.Sounds, Abd Soft, No tenderness,   No Cyanosis, Clubbing or edema    Assessment & Plan:   26 year old female was admitted with fever and altered mental status with work-up suggestive of possible viral  meningitis vs primary presentation of autoimmune disease.  Altered mental status with fever followed by hypothermia/severe sepsis -  Appreciate neurology and ID consultations--this seems to be a viral meningitis versus autoimmune meningitis/encephalitis.  Prelim blood work noted to be positive for ssa (ro) antibody igg, ANA positive, continue on acyclovir and IV Decadron.  Follow CSF serology and viral cultures.  Clinically improving.  Hypothermia -  Patient remains on as needed Bair hugger with temperature at 97 and stable  R/O Addison's disease -she had hyponatremia with hyperkalemia, hypothermia, orthostatic hypotension.  Likely has adrenal insufficiency unfortunately she is on high doses of Decadron.  Plan will be to place her on low-dose steroids with outpatient endocrine follow-up  Orthostatic hypotension.  See above, add TEDs, IVF continue.  AKI in setting of mild elevation of CPK - resolved with hydration   Axillary adenopathy - follow outpatient with surgery for excision biopsy.  ANA +ve, +ve ssa (ro) antibody igg - could have Sjogren's, for now steroids then outpatient follow-up with rheumatology   DVT prophylaxis: Lovenox Code Status: Full Family Communication: Spoke with patient's partner Delana Meyer who was at bedside on 02/26/21. disposition Plan:   Patient is from: Home  Anticipated Discharge Location: Home  Barriers to Discharge: Acutely ill and septic  Is patient medically stable for Discharge: No   Scheduled Meds:  dexamethasone (DECADRON) injection  10 mg Intravenous Q6H   enoxaparin (LOVENOX) injection  40 mg Subcutaneous Q24H   sodium chloride flush  3 mL Intravenous Q12H   Continuous Infusions:  acyclovir 610 mg (02/26/21 0531)   lactated ringers 150 mL/hr at 02/26/21 0614    Data Reviewed:  Basic Metabolic Panel: Recent Labs  Lab 02/22/21 1346 02/22/21 1447 02/23/21 0741 02/23/21 0851 02/24/21 0243 02/25/21 0031 02/26/21 0050  NA 127* 130*   130* 131*  133* 133* 133* 133*  K 4.3 4.5   4.5 5.2* 4.7 4.6 4.3 3.9  CL 97* 98 101  --  102 103 103  CO2 20*  --  21*  --  22 24 24   GLUCOSE 106* 97 116*  --  116* 158* 155*  BUN 19 21* 14  --  12 13 11   CREATININE 1.40* 1.20* 1.03*  --  0.70 0.62 0.70  CALCIUM 8.3*  --  8.2*  --  8.3* 8.3* 8.2*  MG  --   --  2.0  --   --  2.0  --   PHOS  --   --  3.7  --   --   --   --     CBC: Recent Labs  Lab 02/22/21 1346 02/22/21 1447 02/23/21 0741 02/23/21 0851 02/24/21 0243 02/25/21 0031 02/26/21 0050  WBC 4.9  --  6.6  --  6.7 6.7 7.3  NEUTROABS 4.3  --  6.3  --  6.1 6.1 6.8  HGB 11.2*   < > 9.1* 10.5* 9.4* 8.8* 8.4*  HCT 33.2*   < > 27.9* 31.0* 27.7* 25.6* 25.5*  MCV 77.6*  --  78.6*  --  75.7* 75.5* 76.8*  PLT 154  --  PLATELET CLUMPS NOTED ON SMEAR, UNABLE TO ESTIMATE  --  141* 115* 151   < > = values in this interval not displayed.    Studies: No results found.  Principal Problem:   Severe sepsis ([place]) Active Problems:   AKI (acute kidney injury) ([place])   Acute encephalopathy   Hyponatremia   Elevated transaminase level   Axillary lymphadenopathy   Lala Lund, [facility]  If 7PM-7AM, please contact night-coverage [email or web address]   LOS: 4 days

## 2021-02-26 NOTE — Progress Notes (Signed)
[facility]                    Date of Admission:  02/22/2021                                 Total days of inpatient antibiotics 4                                                                Reason for Consult: Acute encephalopathy                            Principal Problem:   Severe sepsis ([place]) Active Problems:   AKI (acute kidney injury) ([place])   Acute encephalopathy   Hyponatremia   Elevated transaminase level   Axillary lymphadenopathy     Assessment: 25 YF with history of chlamydia(last episode 2 years ago), recent ED visit for possible viral URI(COVID/flu negative) admitted with fever and altered mental status. She underwent LP with CSF studies showing neutrophil predominant pleocytosis, PTN 120, GLC 30. She is on empiric meningitis coverage with vancomycin , ceftriaxone and acyclovir. Neurology recommend ID consult given concern for viral meningitis in the setting of neutrophilic predominant pleocytosis with mild hypoglycorrhachia.   #Altered mental status #Pleocytosis with neutrophil predominance -CSF cell count: 7 rbc, 19 wbc, 77% neutrophils, 21% lymphs, 2% mono, 120 PTN, 30 GLC. CK on admission 6, 127->3,292. -Etiology: Initially thought to be 2/2 viral meningitis. Given that HSV PCR in CSF  is negative, it seems less likely. Will leave Acyclovir on while awaiting VZV PCR CSF(although my suspicion is low).  -Autoimmune work-up returned +SSA ab (>8) which is strongly positive. I discussed with Neurology as far as autoimmune encephalitis being the etiology: plan on doing MRI brain, high dose steroids and autoimmune work up.  -Other possible infectious etiology include: TB meningitis(unlikely as she has no Hx of exposure or risk factors), neurosyphilis(she does not report any recent exposures-RPR non reactive in 2019).    Recommendations:  -D/C vancomycin and ceftriaxone -Continue acyclovir while awaiting VZV CSF  studies -Follow-up VDRL, RPR, HIV-RNA, IGRA -Follow-up MRI head. Agree with high dose steroids and auto-immune encephalitis work-up.  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 Qty: 60 tablet, Refills: 0    metroNIDAZOLE (FLAGYL) 500 MG tablet Take 1 tablet (500 mg total) by mouth 2 (two) times daily. Qty: 14 tablet, Refills: 0   Associated Diagnoses: BV (bacterial vaginosis)        Subjective: Pt is in a bear hugger. She reports feeling better overall  Interval: No hypothermia or huyoerthermia overnight  Review of Systems: Review of Systems  All other systems reviewed and are negative.  Past Medical History:  Diagnosis Date   Chlamydia 4/12    Social History   Tobacco Use   Smoking status: Never   Smokeless tobacco: Never  Vaping Use   Vaping Use: Never used  Substance Use Topics   Alcohol use: Not Currently    Alcohol/week: 0.0 standard drinks    Comment: Last drink December 2019   Drug use: No    Family History  Problem Relation Age of Onset   Hypertension Father    Schizophrenia Mother     Hypertension Maternal Grandmother     No Known Allergies  Health Maintenance  Topic Date Due   HPV VACCINES (1 - Risk 3-dose series) Never done   COVID-19 Vaccine (3 - Booster for Pfizer series) 07/26/2019   INFLUENZA VACCINE  Never done   PAP-Cervical Cytology Screening  06/12/2022   PAP SMEAR-Modifier  06/12/2022   TETANUS/TDAP  04/12/2025   Hepatitis C Screening  Completed   HIV Screening  Completed   Pneumococcal Vaccine 25-75 Years old  Aged Out    Objective:  Vitals:   02/25/21 2000 02/26/21 0000 02/26/21 0500 02/26/21 0740  BP: 123/85 117/75  106/64  Pulse: 76 78  85  Resp: 20 (!) 22 15 20   Temp: 97.8 F (36.6 C) 98.1 F (36.7 C)  98.3 F (36.8 C)  TempSrc: Oral Oral  Oral  SpO2: 97% 96%  93%  Weight:   55.2 kg   Height:       Body mass index is 20.57 kg/m.  Physical Exam Constitutional:      Appearance: Normal appearance.  HENT:     Head: Normocephalic and atraumatic.     Right Ear: Tympanic membrane normal.     Left Ear: Tympanic membrane normal.     Nose: Nose normal.     Mouth/Throat:     Mouth: Mucous membranes are moist.  Eyes:     Extraocular Movements: Extraocular movements intact.     Pupils: Pupils are equal, round, and reactive to light.     Comments: Injected conjunctiva  Cardiovascular:     Rate and Rhythm: Normal rate and regular rhythm.     Heart sounds: No murmur heard.   No friction rub. No gallop.  Pulmonary:     Effort: Pulmonary effort is normal.     Breath sounds: Normal breath sounds.  Abdominal:     General: Abdomen is flat.     Palpations: Abdomen is soft.  Musculoskeletal:     Comments: ROM at neck improved  Skin:    General: Skin is warm and dry.  Neurological:     General: No focal deficit present.     Mental Status: She is alert and oriented to person, place, and time.  Psychiatric:        Mood and Affect: Mood normal.    Lab Results Lab Results  Component Value Date   WBC 7.3 02/26/2021   HGB 8.4 (L)  02/26/2021   HCT 25.5 (L) 02/26/2021  MCV 76.8 (L) 02/26/2021   PLT 151 02/26/2021    Lab Results  Component Value Date   CREATININE 0.70 02/26/2021   BUN 11 02/26/2021   NA 133 (L) 02/26/2021   K 3.9 02/26/2021   CL 103 02/26/2021   CO2 24 02/26/2021    Lab Results  Component Value Date   ALT 20 02/25/2021   AST 67 (H) 02/25/2021   ALKPHOS 22 (L) 02/25/2021   BILITOT 0.8 02/25/2021    No results found for: CHOL, HDL, LDLCALC, LDLDIRECT, TRIG, CHOLHDL Lab Results  Component Value Date   LABRPR Non Reactive 06/12/2019   No results found for: HIV1RNAQUANT, HIV1RNAVL, [facility] 02/26/2021, 10:29 AM

## 2021-02-27 ENCOUNTER — Inpatient Hospital Stay (HOSPITAL_COMMUNITY): Payer: Medicaid Other

## 2021-02-27 DIAGNOSIS — A419 Sepsis, unspecified organism: Secondary | ICD-10-CM | POA: Diagnosis not present

## 2021-02-27 DIAGNOSIS — R6520 Severe sepsis without septic shock: Secondary | ICD-10-CM | POA: Diagnosis not present

## 2021-02-27 DIAGNOSIS — G9340 Encephalopathy, unspecified: Secondary | ICD-10-CM | POA: Diagnosis not present

## 2021-02-27 DIAGNOSIS — R509 Fever, unspecified: Secondary | ICD-10-CM | POA: Diagnosis not present

## 2021-02-27 DIAGNOSIS — R4182 Altered mental status, unspecified: Secondary | ICD-10-CM | POA: Diagnosis not present

## 2021-02-27 LAB — COMPREHENSIVE METABOLIC PANEL
ALT: 37 U/L (ref 0–44)
AST: 79 U/L — ABNORMAL HIGH (ref 15–41)
Albumin: 2.3 g/dL — ABNORMAL LOW (ref 3.5–5.0)
Alkaline Phosphatase: 24 U/L — ABNORMAL LOW (ref 38–126)
Anion gap: 6 (ref 5–15)
BUN: 11 mg/dL (ref 6–20)
CO2: 23 mmol/L (ref 22–32)
Calcium: 7.9 mg/dL — ABNORMAL LOW (ref 8.9–10.3)
Chloride: 102 mmol/L (ref 98–111)
Creatinine, Ser: 0.64 mg/dL (ref 0.44–1.00)
GFR, Estimated: >60 mL/min (ref >60)
Glucose, Bld: 135 mg/dL — ABNORMAL HIGH (ref 70–99)
Potassium: 4.2 mmol/L (ref 3.5–5.1)
Sodium: 131 mmol/L — ABNORMAL LOW (ref 135–145)
Total Bilirubin: 0.4 mg/dL (ref 0.3–1.2)
Total Protein: 5.3 g/dL — ABNORMAL LOW (ref 6.5–8.1)

## 2021-02-27 LAB — ENA+DNA/DS+ANTICH+CENTRO+JO...
Anti JO-1: <0.2 AI (ref 0.0–0.9)
Centromere Ab Screen: <0.2 AI (ref 0.0–0.9)
Chromatin Ab SerPl-aCnc: <0.2 AI (ref 0.0–0.9)
ENA SM Ab Ser-aCnc: <0.2 AI (ref 0.0–0.9)
Ribonucleic Protein: 0.2 AI (ref 0.0–0.9)
SSA (Ro) (ENA) Antibody, IgG: >8 AI — ABNORMAL HIGH (ref 0.0–0.9)
SSB (La) (ENA) Antibody, IgG: 0.3 AI (ref 0.0–0.9)
Scleroderma (Scl-70) (ENA) Antibody, IgG: <0.2 AI (ref 0.0–0.9)
ds DNA Ab: 1 IU/mL (ref 0–9)

## 2021-02-27 LAB — CBC WITH DIFFERENTIAL/PLATELET
Abs Immature Granulocytes: 0.1 10*3/uL — ABNORMAL HIGH (ref 0.00–0.07)
Basophils Absolute: 0 10*3/uL (ref 0.0–0.1)
Basophils Relative: 0 %
Eosinophils Absolute: 0 10*3/uL (ref 0.0–0.5)
Eosinophils Relative: 0 %
HCT: 25.1 % — ABNORMAL LOW (ref 36.0–46.0)
Hemoglobin: 8.2 g/dL — ABNORMAL LOW (ref 12.0–15.0)
Immature Granulocytes: 1 %
Lymphocytes Relative: 5 %
Lymphs Abs: 0.4 10*3/uL — ABNORMAL LOW (ref 0.7–4.0)
MCH: 25.1 pg — ABNORMAL LOW (ref 26.0–34.0)
MCHC: 32.7 g/dL (ref 30.0–36.0)
MCV: 76.8 fL — ABNORMAL LOW (ref 80.0–100.0)
Monocytes Absolute: 0.4 10*3/uL (ref 0.1–1.0)
Monocytes Relative: 5 %
Neutro Abs: 7.2 10*3/uL (ref 1.7–7.7)
Neutrophils Relative %: 89 %
Platelets: 198 10*3/uL (ref 150–400)
RBC: 3.27 MIL/uL — ABNORMAL LOW (ref 3.87–5.11)
RDW: 14.1 % (ref 11.5–15.5)
WBC: 8.1 10*3/uL (ref 4.0–10.5)
nRBC: 0 % (ref 0.0–0.2)

## 2021-02-27 LAB — ANTI-DNA ANTIBODY, DOUBLE-STRANDED: ds DNA Ab: 1 IU/mL (ref 0–9)

## 2021-02-27 LAB — MAGNESIUM: Magnesium: 2.3 mg/dL (ref 1.7–2.4)

## 2021-02-27 LAB — CULTURE, BLOOD (ROUTINE X 2)
Culture: NO GROWTH
Culture: NO GROWTH
Special Requests: ADEQUATE
Special Requests: ADEQUATE

## 2021-02-27 LAB — QUANTIFERON-TB GOLD PLUS (RQFGPL)
QuantiFERON Mitogen Value: 0.2 IU/mL
QuantiFERON Nil Value: 0.11 IU/mL
QuantiFERON TB1 Ag Value: 0.12 IU/mL
QuantiFERON TB2 Ag Value: 0.11 IU/mL

## 2021-02-27 LAB — OSMOLALITY: Osmolality: 281 mOsm/kg (ref 275–295)

## 2021-02-27 LAB — SODIUM, URINE, RANDOM: Sodium, Ur: 119 mmol/L

## 2021-02-27 LAB — CORTISOL: Cortisol, Plasma: 0.9 ug/dL

## 2021-02-27 LAB — OSMOLALITY, URINE: Osmolality, Ur: 472 mOsm/kg (ref 300–900)

## 2021-02-27 LAB — CK: Total CK: 289 U/L — ABNORMAL HIGH (ref 38–234)

## 2021-02-27 LAB — QUANTIFERON-TB GOLD PLUS: QuantiFERON-TB Gold Plus: UNDETERMINED — AB

## 2021-02-27 LAB — URIC ACID: Uric Acid, Serum: 2.1 mg/dL — ABNORMAL LOW (ref 2.5–7.1)

## 2021-02-27 LAB — TSH: TSH: 0.401 u[IU]/mL (ref 0.350–4.500)

## 2021-02-27 LAB — ANA W/REFLEX IF POSITIVE: Anti Nuclear Antibody (ANA): POSITIVE — AB

## 2021-02-27 MED ORDER — DEXAMETHASONE SODIUM PHOSPHATE 10 MG/ML IJ SOLN: 10.0000 mg | Freq: Four times a day (QID) | INTRAMUSCULAR | Status: DC

## 2021-02-27 MED ORDER — GADOBUTROL 1 MMOL/ML IV SOLN
5.0000 mL | Freq: Once | INTRAVENOUS | Status: AC | PRN
  Administered 2021-02-27: 5 mL via INTRAVENOUS

## 2021-02-27 MED ORDER — SODIUM CHLORIDE 0.9 % IV SOLN
1000.0000 mg | Freq: Every day | INTRAVENOUS | Status: AC
  Administered 2021-02-28 – 2021-03-04 (×5): 1000 mg via INTRAVENOUS
  Filled 2021-02-27 (×5): qty 16

## 2021-02-27 NOTE — Progress Notes (Signed)
Neurology progress note  S: Patient's mental status and neurologic exam are improving. She is fully oriented today without focal deficits.  Data:  MRI brain wwo: multiple small T2/FLAIR hyperintensities in bilateral white matter abnl for age, none enhance (personal review)  O:  Vitals:   02/27/21 1203 02/27/21 1559  BP: 130/79 136/80  Pulse: 76 91  Resp: 16 15  Temp: 98.2 F (36.8 C) 98.5 F (36.9 C)  SpO2: 100% 99%    Physical Exam Gen: A&Ox4, NAD HEENT: Atraumatic, normocephalic; oropharynx clear, tongue without atrophy or fasciculations. Resp: CTAB, normal work of breathing CV: RRR, extremities appear well-perfused. Abd: soft/NT/ND Extrem: Nml bulk; no cyanosis, clubbing, or edema.  Neuro: *MS: A&O x4. Follows multi-step commands.  *Speech: no dysarthria or aphasia, able to name and repeat. *CN:    I: Deferred   II,III: PERRLA, VFF by confrontation, optic discs not visualized 2/2 pupillary constriction   III,IV,VI: EOMI w/o nystagmus, no ptosis   V: Sensation intact from V1 to V3 to LT   VII: Eyelid closure was full.  Smile symmetric.   VIII: Hearing intact to voice   IX,X: Voice normal, palate elevates symmetrically    XI: SCM/trap 5/5 bilat   XII: Tongue protrudes midline, no atrophy or fasciculations  *Motor:   Normal bulk.  No tremor, rigidity or bradykinesia. No pronator drift.   Strength: Dlt Bic Tri WE WrF FgS Gr HF KnF KnE PlF DoF    Left 5 5 5 5 5 5 5 5 5 5 5 5     Right 5 5 5 5 5 5 5 5 5 5 5 5    *Sensory: Intact to light touch, pinprick, temperature vibration throughout. Symmetric. Propioception intact bilat.  No double-simultaneous extinction.  *Coordination:  Finger-to-nose, heel-to-shin, rapid alternating motions were intact. *Reflexes:  2+ and symmetric throughout without clonus; toes down-going bilat *Gait: deferred  A/P: Erin French is a 26 y.o. female who is admitted with confusion, fever, chills and lower abdominal pain. Extensive workup  with CSF studies with pleocytosis (WBC 21) which has a neutrophilic predominance, elevated protein to 120 and mild hypoglycorrhachia(Glu: 30). Viral meningitis is on the differential but HSV returned negative. Sjogren's autoAb strongly positive. Increased suspicion for autoimmune encephalopathy. I have sent off serum and CSF panels for autoimmune encephalitis Abs to [facility] clinic which will take approx 2-3 weeks to result. In the meantime recommend empiric course IV solumedrol 1g daily x5 days. ID is amenable to this. I am not sure what to make of her MRI findings, except that it is abnormal and also not c/w viral meningitis and provides evidence in support of alternative etiology (though discrete lesions are less common than more subtle changes in autoimmune encephalitis as well).  - IV solumedrol 1000mg  daily x5 days start 1/6 end date 1/10 (I will order) - Will continue to follow  Su Monks, MD [facility] [phone]  If 7pm- 7am, please page neurology on call as listed in [place].

## 2021-02-27 NOTE — Progress Notes (Addendum)
PROGRESS NOTE    Erin French  ZOX:[id number]  DOB: October 05, 1995  DOA: 02/22/2021 PCP: Karleen Dolphin, MD (Inactive) Outpatient Specialists:   Hospital course:  26 year old female with history of chlamydia was admitted last night with altered mental status, fevers chills and lower abdominal pain.  Patient had extensive work-up as she was unable to provide a history.  Work-up was notable for CSF consistent with likely viral meningitis and possible UTI.  Also noted to have UDS positive only for THC, CPK 6000 and normal lactate.  COVID and influenza were negative.  Head CT was negative.  Patient was started on broad-spectrum antibiotics including ceftriaxone, acyclovir to cover meningitis as well as vancomycin and Flagyl.   Subjective: Patient in bed feels a whole lot better no headache chest or abdominal pain, no shortness of breath or fever, no focal weakness.  No problems with vision or hearing, no eye pain.  Objective: Vitals:   02/27/21 0113 02/27/21 0450 02/27/21 0500 02/27/21 0755  BP: 123/76 122/89  114/63  Pulse: 83 72  83  Resp: (!) 22 19  10   Temp: 98.3 F (36.8 C) 98.8 F (37.1 C)  98.2 F (36.8 C)  TempSrc: Axillary Oral  Oral  SpO2: 97% 98%  99%  Weight:   54.5 kg   Height:        Intake/Output Summary (Last 24 hours) at 02/27/2021 1152 Last data filed at 02/27/2021 0934 Gross per 24 hour  Intake 431.32 ml  Output 1300 ml  Net -868.68 ml   Filed Weights   02/25/21 0500 02/26/21 0500 02/27/21 0500  Weight: 60.7 kg 55.2 kg 54.5 kg     Exam:  Awake Alert, Oriented X 3, No new F.N deficits, Normal affect [place].AT,PERRAL right eye conjunctival hemorrhage stable, Supple Neck,No JVD, No cervical lymphadenopathy appriciated.  Symmetrical Chest wall movement, Good air movement bilaterally, CTAB RRR,No Gallops, Rubs or new Murmurs, No Parasternal Heave +ve B.Sounds, Abd Soft, No tenderness, No organomegaly appriciated, No rebound - guarding or rigidity. No Cyanosis,  Clubbing or edema, No new Rash or bruise   Assessment & Plan:   26 year old female was admitted with fever and altered mental status with work-up suggestive of possible viral meningitis vs primary presentation of autoimmune disease.  Altered mental status with fever followed by hypothermia/severe sepsis -  Appreciate neurology and ID consultations--this seems to be a viral meningitis versus autoimmune meningitis/encephalitis.  Prelim blood work noted to be positive for ssa (ro) antibody igg, ANA positive, continue on acyclovir and IV Decadron.  Follow CSF serology and viral cultures.  Clinically improving.  MRI brain with and without contrast on 02/27/2021, quanteferon serology noted will defer to ID.  Hypothermia - resolved.  R/O Addison's disease -she had hyponatremia with hyperkalemia, hypothermia, orthostatic hypotension.  Likely has adrenal insufficiency unfortunately she is on high doses of Decadron.  Plan will be to place her on low-dose steroids with outpatient endocrine follow-up  Orthostatic hypotension.  See above, add TEDs, IVF continue.  AKI in setting of mild elevation of CPK - resolved with hydration   Axillary adenopathy - follow outpatient with surgery for excision biopsy.  ANA +ve, +ve ssa (ro) antibody igg - could have Sjogren's, for now steroids then outpatient follow-up with rheumatology   DVT prophylaxis: Lovenox Code Status: Full Family Communication: Spoke with patient's partner Delana Meyer who was at bedside on 02/26/21, 02/27/2021. disposition Plan:   Patient is from: Home  Anticipated Discharge Location: Home  Barriers to Discharge: Acutely ill and septic  Is patient medically stable for Discharge: No   Scheduled Meds:  dexamethasone (DECADRON) injection  10 mg Intravenous Q6H   enoxaparin (LOVENOX) injection  40 mg Subcutaneous Q24H   sodium chloride flush  3 mL Intravenous Q12H   Continuous Infusions:  acyclovir 610 mg (02/27/21 0558)   lactated ringers 75  mL/hr at 02/26/21 2331    Data Reviewed:  Basic Metabolic Panel: Recent Labs  Lab 02/23/21 0741 02/23/21 0851 02/24/21 0243 02/25/21 0031 02/26/21 0050 02/27/21 0059  NA 131* 133* 133* 133* 133* 131*  K 5.2* 4.7 4.6 4.3 3.9 4.2  CL 101  --  102 103 103 102  CO2 21*  --  22 24 24 23   GLUCOSE 116*  --  116* 158* 155* 135*  BUN 14  --  12 13 11 11   CREATININE 1.03*  --  0.70 0.62 0.70 0.64  CALCIUM 8.2*  --  8.3* 8.3* 8.2* 7.9*  MG 2.0  --   --  2.0  --  2.3  PHOS 3.7  --   --   --   --   --     CBC: Recent Labs  Lab 02/23/21 0741 02/23/21 0851 02/24/21 0243 02/25/21 0031 02/26/21 0050 02/27/21 0059  WBC 6.6  --  6.7 6.7 7.3 8.1  NEUTROABS 6.3  --  6.1 6.1 6.8 7.2  HGB 9.1* 10.5* 9.4* 8.8* 8.4* 8.2*  HCT 27.9* 31.0* 27.7* 25.6* 25.5* 25.1*  MCV 78.6*  --  75.7* 75.5* 76.8* 76.8*  PLT PLATELET CLUMPS NOTED ON SMEAR, UNABLE TO ESTIMATE  --  141* 115* 151 198    Studies: No results found.  Principal Problem:   Severe sepsis ([place]) Active Problems:   AKI (acute kidney injury) ([place])   Acute encephalopathy   Hyponatremia   Elevated transaminase level   Axillary lymphadenopathy  Signature  Lala Lund M.D on 02/27/2021 at 11:52 AM   -  To page go to [email or web address]       LOS: 5 days

## 2021-02-27 NOTE — Progress Notes (Signed)
Physical Therapy Treatment Patient Details Name: Erin French MRN: [id number] DOB: 1996/01/03 Today's Date: 02/27/2021   History of Present Illness 26 y.o. female presenting to the emergency department 02/22/21 with altered mental status. EEG moderate diffuse encephalopathy; possible UTI; viral meningitis suspected per Neuro and ID consults, but later ruled out; ?autoimmune encephalitis   PMH significant for chlamydia    PT Comments    Patient not orthostatic with changes in position today. Was able to progress to walking with RW and 2 person min assist due to ataxia and difficulty with foot placement. Patient highly motivated to get better and return home.     Recommendations for follow up therapy are one component of a multi-disciplinary discharge planning process, led by the attending physician.  Recommendations may be updated based on patient status, additional functional criteria and insurance authorization.  Follow Up Recommendations  Acute inpatient rehab (3hours/day)     Assistance Recommended at Discharge Frequent or constant Supervision/Assistance  Patient can return home with the following Two people to help with walking and/or transfers;Assistance with cooking/housework;Assist for transportation;Help with stairs or ramp for entrance;A lot of help with bathing/dressing/bathroom;Direct supervision/assist for medications management;Direct supervision/assist for financial management   Equipment Recommendations  Wheelchair (measurements PT);Wheelchair cushion (measurements PT);Hospital bed;BSC/3in1;Rolling walker (2 wheels)    Recommendations for Other Services       Precautions / Restrictions Precautions Precautions: Fall Precaution Comments: pt reports some falls at home PTA     Mobility  Bed Mobility Overal bed mobility: Needs Assistance Bed Mobility: Supine to Sit     Supine to sit: Min guard     General bed mobility comments: Min gaurd for steadying. Requires  increased time/effort. Cues for hand placement.    Transfers Overall transfer level: Needs assistance Equipment used: Rolling walker (2 wheels) Transfers: Sit to/from Stand;Bed to chair/wheelchair/BSC Sit to Stand: Min assist;+2 physical assistance;+2 safety/equipment     Step pivot transfers: Min assist;+2 physical assistance;+2 safety/equipment     General transfer comment: Min A +2 for safety/steadying with use of RW. Grossly ataxic.    Ambulation/Gait Ambulation/Gait assistance: Min assist;+2 physical assistance;+2 safety/equipment Gait Distance (Feet): 5 Feet (seated rest 10 ft) Assistive device: Rolling walker (2 wheels) Gait Pattern/deviations: Step-to pattern;Decreased step length - right;Decreased step length - left;Ataxic;Wide base of support Gait velocity: decr Gait velocity interpretation: <1.31 ft/sec, indicative of household ambulator   General Gait Details: vc for safe use of RW; pt safest with step-to pattern due to ataxia with imbalance   Stairs             Wheelchair Mobility    Modified Rankin (Stroke Patients Only)       Balance Overall balance assessment: Needs assistance Sitting-balance support: Bilateral upper extremity supported;Feet supported Sitting balance-Leahy Scale: Poor Sitting balance - Comments: Reliant on BUE support on bed surface initially 2/2 ataxia. Min gaurd to maintain static sitting balance at EOB. Postural control: Posterior lean;Right lateral lean Standing balance support: Bilateral upper extremity supported;During functional activity Standing balance-Leahy Scale: Poor Standing balance comment: Reliant on external assist for safety. BUE supported on RW.                            Cognition Arousal/Alertness: Awake/alert Behavior During Therapy: Flat affect Overall Cognitive Status: Impaired/Different from baseline Area of Impairment: Memory;Problem solving                     Memory: Decreased  short-term  memory       Problem Solving: Slow processing General Comments: patient required repetition of safest hand placement with each transfer x 3 reps        Exercises      General Comments General comments (skin integrity, edema, etc.): BPs stable with supine to sit and after ambulation      Pertinent Vitals/Pain Pain Assessment: No/denies pain    Home Living                          Prior Function            PT Goals (current goals can now be found in the care plan section) Acute Rehab PT Goals Patient Stated Goal: Get better and return home to kids Time For Goal Achievement: 03/11/21 Potential to Achieve Goals: Good Progress towards PT goals: Progressing toward goals    Frequency    Min 4X/week      PT Plan Current plan remains appropriate    Co-evaluation              AM-PAC PT "6 Clicks" Mobility   Outcome Measure  Help needed turning from your back to your side while in a flat bed without using bedrails?: None Help needed moving from lying on your back to sitting on the side of a flat bed without using bedrails?: A Little Help needed moving to and from a bed to a chair (including a wheelchair)?: Total Help needed standing up from a chair using your arms (e.g., wheelchair or bedside chair)?: Total Help needed to walk in hospital room?: Total Help needed climbing 3-5 steps with a railing? : Total 6 Click Score: 11    End of Session Equipment Utilized During Treatment: Gait belt Activity Tolerance: Patient tolerated treatment well Patient left: with call bell/phone within reach;with family/visitor present;with nursing/sitter in room;in chair;with chair alarm set Nurse Communication: Other (comment);Mobility status (ataxia) PT Visit Diagnosis: Other symptoms and signs involving the nervous system (R29.898);Dizziness and giddiness (R42)     Time: 0388-8280 PT Time Calculation (min) (ACUTE ONLY): 25 min  Charges:  $Gait  Training: 23-37 mins                      Arby Barrette, PT [place]  Pager [phone] Office [phone]    Rexanne Mano 02/27/2021, 2:30 PM

## 2021-02-27 NOTE — Progress Notes (Signed)
[facility]                    Date of Admission:  02/22/2021                                 Total days of inpatient antibiotics 4                                                                Reason for Consult: Acute encephalopathy                            Principal Problem:   Severe sepsis ([place]) Active Problems:   AKI (acute kidney injury) ([place])   Acute encephalopathy   Hyponatremia   Elevated transaminase level   Axillary lymphadenopathy     Assessment: 25 YF with history of chlamydia(last episode 2 years ago), recent ED visit for possible viral URI(COVID/flu negative) admitted with fever and altered mental status. She underwent LP with CSF studies showing neutrophil predominant pleocytosis, PTN 120, GLC 30. She is on empiric meningitis coverage with vancomycin , ceftriaxone and acyclovir. Neurology recommend ID consult given concern for viral meningitis in the setting of neutrophilic predominant pleocytosis with mild hypoglycorrhachia.   #Altered mental status #Pleocytosis with neutrophil predominance -CSF cell count: 7 rbc, 19 wbc, 77% neutrophils, 21% lymphs, 2% mono, 120 PTN, 30 GLC. CK on admission 6, 127->3,292. VDRl NR, HSV 1/2 negative -HIV RNA <20, HIV Ab negative consistent with being HIV negative -Autoimmune work-up returned +SSA ab (>8) which is strongly positive. ANA reactive -Neurology engaged and ordered MRI, autoimmune encephalitis panel and plan on high dose steroids.  -MRI brain showed small white matter hyperintensities which could represent, white matter diease, demyelinating disease, vasculitis, chronic ischemia. 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    Recommendations:  -Continue acyclovir while awaiting VZV CSF studies - Agree with high dose steroids and auto-immune encephalitis work-up.  -IGRA is indeterminate, would repeat outpatient.  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 Qty: 60 tablet, Refills: 0    metroNIDAZOLE (FLAGYL) 500 MG tablet Take 1 tablet (500 mg total) by mouth 2 (two) times daily.  Qty: 14 tablet, Refills: 0   Associated Diagnoses: BV (bacterial vaginosis)        Subjective: Pt is sitting in chair. She reports HA has resolved. Full ROM has returned to her neck.  Interval: Afebrile overnight.wbbc 8.1K  Review of Systems: Review of Systems  All other systems reviewed and are negative.  Past Medical History:  Diagnosis Date   Chlamydia 4/12    Social History   Tobacco Use   Smoking status: Never   Smokeless tobacco: Never  Vaping Use   Vaping Use: Never used  Substance Use Topics   Alcohol use: Not Currently    Alcohol/week: 0.0 standard drinks    Comment: Last drink December 2019   Drug use: No    Family History  Problem Relation Age of Onset   Hypertension Father    Schizophrenia Mother    Hypertension Maternal Grandmother     No Known Allergies  Health Maintenance  Topic Date Due   HPV VACCINES (1 - Risk 3-dose series)  Never done   COVID-19 Vaccine (3 - Booster for Pfizer series) 07/26/2019   INFLUENZA VACCINE  Never done   PAP-Cervical Cytology Screening  06/12/2022   PAP SMEAR-Modifier  06/12/2022   TETANUS/TDAP  04/12/2025   Hepatitis C Screening  Completed   HIV Screening  Completed   Pneumococcal Vaccine 24-92 Years old  Aged Out    Objective:  Vitals:   02/27/21 0500 02/27/21 0755 02/27/21 1203 02/27/21 1559  BP:  114/63 130/79 136/80  Pulse:  83 76 91  Resp:  10 16 15   Temp:  98.2 F (36.8 C) 98.2 F (36.8 C) 98.5 F (36.9 C)  TempSrc:  Oral Oral Oral  SpO2:  99% 100% 99%  Weight: 54.5 kg     Height:       Body mass index is 20.31 kg/m.  Physical Exam Constitutional:      Appearance: Normal appearance.  HENT:     Head: Normocephalic and atraumatic.     Right Ear: Tympanic membrane normal.     Left Ear: Tympanic membrane normal.     Nose: Nose normal.     Mouth/Throat:     Mouth: Mucous membranes are moist.  Eyes:     Extraocular Movements: Extraocular movements intact.     Pupils: Pupils are equal, round, and reactive to light.     Comments: Conjectivae injection improved  Cardiovascular:     Rate and Rhythm: Normal rate and regular rhythm.     Heart sounds: No murmur heard.   No friction rub. No gallop.  Pulmonary:     Effort: Pulmonary effort is normal.     Breath sounds: Normal breath sounds.  Abdominal:     General: Abdomen is flat.     Palpations: Abdomen is soft.  Musculoskeletal:        General: Normal range of motion.  Skin:    General: Skin is warm and dry.  Neurological:     General: No focal deficit present.     Mental Status: She is alert and oriented to person, place, and time.  Psychiatric:        Mood and Affect: Mood normal.    Lab Results Lab Results  Component Value Date   WBC 8.1 02/27/2021   HGB 8.2 (L) 02/27/2021   HCT 25.1 (L) 02/27/2021   MCV 76.8 (L) 02/27/2021   PLT 198 02/27/2021    Lab Results  Component Value Date    CREATININE 0.64 02/27/2021  BUN 11 02/27/2021   NA 131 (L) 02/27/2021   K 4.2 02/27/2021   CL 102 02/27/2021   CO2 23 02/27/2021    Lab Results  Component Value Date   ALT 37 02/27/2021   AST 79 (H) 02/27/2021   ALKPHOS 24 (L) 02/27/2021   BILITOT 0.4 02/27/2021    No results found for: CHOL, HDL, LDLCALC, LDLDIRECT, TRIG, CHOLHDL Lab Results  Component Value Date   LABRPR NON REACTIVE 02/24/2021   HIV 1 RNA Quant (copies/mL)  Date Value  02/25/2021 <20      [facility] 02/27/2021, 4:25 PM

## 2021-02-27 NOTE — Evaluation (Signed)
Occupational Therapy Evaluation Patient Details Name: Erin French MRN: [id number] DOB: [birth year] Today's Date: 02/27/2021   History of Present Illness 26 y.o. female presenting to the emergency department 02/22/21 with altered mental status. EEG moderate diffuse encephalopathy; possible UTI; viral meningitis per Neuro and ID consults   PMH significant for chlamydia   Clinical Impression   PTA patient was living with her significant other and 2 young children and was grossly I with ADLs/IADLs and working as a Educational psychologist. Patient currently functioning below baseline demonstrating observed ADLs and functional transfers with Min A +2 secondary to deficits listed below including ataxia and cognitive deficits (unable to complete SBT). Patient would benefit from continued acute OT services in prep for safe d/c to next level of care with recommendation for acute inpatient rehab.       Recommendations for follow up therapy are one component of a multi-disciplinary discharge planning process, led by the attending physician.  Recommendations may be updated based on patient status, additional functional criteria and insurance authorization.   Follow Up Recommendations  Acute inpatient rehab (3hours/day)    Assistance Recommended at Discharge Frequent or constant Supervision/Assistance  Patient can return home with the following      Functional Status Assessment  Patient has had a recent decline in their functional status and demonstrates the ability to make significant improvements in function in a reasonable and predictable amount of time.  Equipment Recommendations  Other (comment) (Defer to next level of care.)    Recommendations for Other Services Rehab consult     Precautions / Restrictions Precautions Precautions: Fall Precaution Comments: pt reports some falls at home PTA Restrictions Weight Bearing Restrictions: No      Mobility Bed Mobility Overal bed mobility: Needs  Assistance Bed Mobility: Supine to Sit     Supine to sit: Min guard     General bed mobility comments: Min gaurd for steadying. Requires increased time/effort. Cues for hand placement.    Transfers Overall transfer level: Needs assistance Equipment used: Rolling walker (2 wheels) Transfers: Sit to/from Stand;Bed to chair/wheelchair/BSC Sit to Stand: Min assist;+2 physical assistance;+2 safety/equipment     Step pivot transfers: Min assist;+2 physical assistance;+2 safety/equipment     General transfer comment: Min A +2 for safety/steadying with use of RW. Grossly ataxic.      Balance Overall balance assessment: Needs assistance Sitting-balance support: Bilateral upper extremity supported;Feet supported Sitting balance-Leahy Scale: Poor Sitting balance - Comments: Reliant on BUE support on bed surface initially 2/2 ataxia. Min gaurd to maintain static sitting balance at EOB. Postural control: Posterior lean;Right lateral lean;Left lateral lean Standing balance support: Bilateral upper extremity supported;During functional activity Standing balance-Leahy Scale: Poor Standing balance comment: Reliant on external assist for safety. BUE supported on RW.                           ADL either performed or assessed with clinical judgement   ADL Overall ADL's : Needs assistance/impaired Eating/Feeding: Set up;Sitting   Grooming: Minimal assistance;Sitting   Upper Body Bathing: Minimal assistance;Sitting   Lower Body Bathing: Minimal assistance;+2 for physical assistance;+2 for safety/equipment;Sit to/from stand   Upper Body Dressing : Minimal assistance;Sitting   Lower Body Dressing: Minimal assistance;+2 for physical assistance;+2 for safety/equipment;Sit to/from stand   Toilet Transfer: Minimal assistance;+2 for physical assistance;+2 for safety/equipment;Rolling walker (2 wheels)   Toileting- Clothing Manipulation and Hygiene: Minimal assistance;+2 for physical  assistance;+2 for safety/equipment;Sit to/from stand  Vision Baseline Vision/History: 0 No visual deficits Ability to See in Adequate Light: 0 Adequate Patient Visual Report: Blurring of vision Additional Comments: Reports increased blurring of vision since admission.     Perception     Praxis      Pertinent Vitals/Pain Pain Assessment: No/denies pain     Hand Dominance Right   Extremity/Trunk Assessment Upper Extremity Assessment Upper Extremity Assessment: RUE deficits/detail;LUE deficits/detail RUE Deficits / Details: AROM WFL; MMT 5/5 RUE Sensation:  (reports numbness in hands/fingers) RUE Coordination: decreased fine motor (Ataxic) LUE Deficits / Details: AROM WFL; MMT 5/5 LUE Sensation:  (reports numbness in hands/fingers) LUE Coordination: decreased fine motor (Ataxic)   Lower Extremity Assessment Lower Extremity Assessment: Defer to PT evaluation   Cervical / Trunk Assessment Cervical / Trunk Assessment: Other exceptions Cervical / Trunk Exceptions: ataxia   Communication Communication Communication: Expressive difficulties (difficult to understand and says muscles won't work right (denies swallowing issues))   Cognition Arousal/Alertness: Awake/alert Behavior During Therapy: Flat affect Overall Cognitive Status: Impaired/Different from baseline Area of Impairment: Orientation;Memory;Problem solving                 Orientation Level: Disoriented to;Time   Memory: Decreased short-term memory       Problem Solving: Slow processing General Comments: Patient with difficulty on multiple parts of the SBT. Emotionally labile.     General Comments  VSS on RA.    Exercises     Shoulder Instructions      Home Living Family/patient expects to be discharged to:: Private residence Living Arrangements: Spouse/significant other;Children (partner, Jazmin; 2 and 43 yo children) Available Help at Discharge: Family;Available 24  hours/day Type of Home: House Home Access: Level entry     Home Layout: One level               Home Equipment: None   Additional Comments: home set-up is for partner's mother's home (where Anistyn plans to go when leaves hospital due to # steps at her townhouse)      Prior Functioning/Environment Prior Level of Function : Independent/Modified Independent               ADLs Comments: Worked as a Educational psychologist.        OT Problem List: Impaired balance (sitting and/or standing);Impaired vision/perception;Decreased coordination;Decreased cognition;Decreased safety awareness;Decreased knowledge of use of DME or AE;Impaired sensation;Impaired UE functional use      OT Treatment/Interventions: Self-care/ADL training;Therapeutic exercise;Neuromuscular education;Energy conservation;DME and/or AE instruction;Therapeutic activities;Cognitive remediation/compensation;Visual/perceptual remediation/compensation;Patient/family education;Balance training    OT Goals(Current goals can be found in the care plan section) Acute Rehab OT Goals Patient Stated Goal: To get better. OT Goal Formulation: With patient Time For Goal Achievement: 03/13/21 Potential to Achieve Goals: Good ADL Goals Pt Will Perform Grooming: standing;with modified independence Pt Will Perform Upper Body Dressing: with modified independence Pt Will Perform Lower Body Dressing: sit to/from stand;with modified independence Pt Will Transfer to Toilet: ambulating;with modified independence Pt Will Perform Toileting - Clothing Manipulation and hygiene: sit to/from stand;with modified independence Pt Will Perform Tub/Shower Transfer: Tub transfer;with modified independence;ambulating;rolling walker Additional ADL Goal #1: Patient will maintain dynamic standing balance with Mod I and LRAD in prep for ADLs/IADLs.  OT Frequency: Min 2X/week    Co-evaluation              AM-PAC OT "6 Clicks" Daily Activity     Outcome  Measure Help from another person eating meals?: A Little Help from another person taking care of personal grooming?:  A Little Help from another person toileting, which includes using toliet, bedpan, or urinal?: A Lot Help from another person bathing (including washing, rinsing, drying)?: A Lot Help from another person to put on and taking off regular upper body clothing?: A Little Help from another person to put on and taking off regular lower body clothing?: A Lot 6 Click Score: 15   End of Session Equipment Utilized During Treatment: Gait belt;Rolling walker (2 wheels) Nurse Communication: Mobility status  Activity Tolerance: Patient tolerated treatment well Patient left: in chair;with call bell/phone within reach;with chair alarm set;with family/visitor present  OT Visit Diagnosis: Other abnormalities of gait and mobility (R26.89);Unsteadiness on feet (R26.81);Muscle weakness (generalized) (M62.81);History of falling (Z91.81);Ataxia, unspecified (R27.0);Other symptoms and signs involving cognitive function                Time: 4982-6415 OT Time Calculation (min): 22 min Charges:  OT General Charges $OT Visit: 1 Visit OT Evaluation $OT Eval Moderate Complexity: 1 Mod  Asli Tokarski H. OTR/L Supplemental OT, Department of rehab services [phone]  Maicie Vanderloop R H. 02/27/2021, 9:51 AM

## 2021-02-28 DIAGNOSIS — A419 Sepsis, unspecified organism: Secondary | ICD-10-CM | POA: Diagnosis not present

## 2021-02-28 DIAGNOSIS — R6520 Severe sepsis without septic shock: Secondary | ICD-10-CM | POA: Diagnosis not present

## 2021-02-28 DIAGNOSIS — G9340 Encephalopathy, unspecified: Secondary | ICD-10-CM | POA: Diagnosis not present

## 2021-02-28 LAB — GLUCOSE, CAPILLARY
Glucose-Capillary: 67 mg/dL — ABNORMAL LOW (ref 70–99)
Glucose-Capillary: 119 mg/dL — ABNORMAL HIGH (ref 70–99)
Glucose-Capillary: 195 mg/dL — ABNORMAL HIGH (ref 70–99)
Glucose-Capillary: 210 mg/dL — ABNORMAL HIGH (ref 70–99)

## 2021-02-28 LAB — CBC WITH DIFFERENTIAL/PLATELET
Abs Immature Granulocytes: 0.15 10*3/uL — ABNORMAL HIGH (ref 0.00–0.07)
Basophils Absolute: 0 10*3/uL (ref 0.0–0.1)
Basophils Relative: 0 %
Eosinophils Absolute: 0 10*3/uL (ref 0.0–0.5)
Eosinophils Relative: 0 %
HCT: 23.9 % — ABNORMAL LOW (ref 36.0–46.0)
Hemoglobin: 7.8 g/dL — ABNORMAL LOW (ref 12.0–15.0)
Immature Granulocytes: 2 %
Lymphocytes Relative: 10 %
Lymphs Abs: 0.8 10*3/uL (ref 0.7–4.0)
MCH: 25.4 pg — ABNORMAL LOW (ref 26.0–34.0)
MCHC: 32.6 g/dL (ref 30.0–36.0)
MCV: 77.9 fL — ABNORMAL LOW (ref 80.0–100.0)
Monocytes Absolute: 0.7 10*3/uL (ref 0.1–1.0)
Monocytes Relative: 8 %
Neutro Abs: 6.3 10*3/uL (ref 1.7–7.7)
Neutrophils Relative %: 80 %
Platelets: 260 10*3/uL (ref 150–400)
RBC: 3.07 MIL/uL — ABNORMAL LOW (ref 3.87–5.11)
RDW: 14.2 % (ref 11.5–15.5)
WBC: 7.9 10*3/uL (ref 4.0–10.5)
nRBC: 0 % (ref 0.0–0.2)

## 2021-02-28 LAB — COMPREHENSIVE METABOLIC PANEL
ALT: 50 U/L — ABNORMAL HIGH (ref 0–44)
AST: 69 U/L — ABNORMAL HIGH (ref 15–41)
Albumin: 2.4 g/dL — ABNORMAL LOW (ref 3.5–5.0)
Alkaline Phosphatase: 22 U/L — ABNORMAL LOW (ref 38–126)
Anion gap: 3 — ABNORMAL LOW (ref 5–15)
BUN: 10 mg/dL (ref 6–20)
CO2: 27 mmol/L (ref 22–32)
Calcium: 8.2 mg/dL — ABNORMAL LOW (ref 8.9–10.3)
Chloride: 103 mmol/L (ref 98–111)
Creatinine, Ser: 0.64 mg/dL (ref 0.44–1.00)
GFR, Estimated: >60 mL/min (ref >60)
Glucose, Bld: 113 mg/dL — ABNORMAL HIGH (ref 70–99)
Potassium: 3.8 mmol/L (ref 3.5–5.1)
Sodium: 133 mmol/L — ABNORMAL LOW (ref 135–145)
Total Bilirubin: 0.6 mg/dL (ref 0.3–1.2)
Total Protein: 5.3 g/dL — ABNORMAL LOW (ref 6.5–8.1)

## 2021-02-28 LAB — MAGNESIUM: Magnesium: 2.2 mg/dL (ref 1.7–2.4)

## 2021-02-28 LAB — HEMOGLOBIN A1C
Hgb A1c MFr Bld: 5.8 % — ABNORMAL HIGH (ref 4.8–5.6)
Mean Plasma Glucose: 119.76 mg/dL

## 2021-02-28 LAB — VZV PCR, CSF: VZV PCR, CSF: NEGATIVE

## 2021-02-28 MED ORDER — INSULIN ASPART 100 UNIT/ML IJ SOLN
0.0000 [IU] | Freq: Three times a day (TID) | INTRAMUSCULAR | Status: DC
  Administered 2021-02-28 – 2021-03-02 (×4): 2 [IU] via SUBCUTANEOUS
  Administered 2021-03-02: 1 [IU] via SUBCUTANEOUS
  Administered 2021-03-03: 2 [IU] via SUBCUTANEOUS
  Administered 2021-03-03: 1 [IU] via SUBCUTANEOUS

## 2021-02-28 MED ORDER — INSULIN ASPART 100 UNIT/ML IJ SOLN: 0.0000 [IU] | Freq: Every day | INTRAMUSCULAR | Status: DC

## 2021-02-28 NOTE — Progress Notes (Signed)
Physical Therapy Treatment Patient Details Name: Erin French MRN: [id number] DOB: [birth year] Today's Date: 02/28/2021   History of Present Illness 26 y.o. female presenting to the emergency department 02/22/21 with altered mental status. EEG moderate diffuse encephalopathy; possible UTI; viral meningitis suspected per Neuro and ID consults, but later ruled out; ?autoimmune encephalitis   PMH significant for chlamydia    PT Comments    Pt tolerates treatment well, ambulating for increased distances. Pt continues to demonstrate imbalance and impaired LE coordination, increasing falls risk. Pt requires 32 seconds to complete 5x sit to stand, indicating an increased falls risk. Pt will benefit from continued aggressive mobilization to aide in improving balance and gait quality. PT continues to recommend AIR placement.  Recommendations for follow up therapy are one component of a multi-disciplinary discharge planning process, led by the attending physician.  Recommendations may be updated based on patient status, additional functional criteria and insurance authorization.  Follow Up Recommendations  Acute inpatient rehab (3hours/day)     Assistance Recommended at Discharge Frequent or constant Supervision/Assistance  Patient can return home with the following A lot of help with walking and/or transfers;A lot of help with bathing/dressing/bathroom;Assistance with cooking/housework;Assist for transportation;Direct supervision/assist for financial management;Direct supervision/assist for medications management   Equipment Recommendations  Rolling walker (2 wheels);BSC/3in1    Recommendations for Other Services       Precautions / Restrictions Precautions Precautions: Fall Precaution Comments: pt reports some falls at home PTA Restrictions Weight Bearing Restrictions: No     Mobility  Bed Mobility Overal bed mobility: Needs Assistance Bed Mobility: Supine to Sit;Sit to Supine      Supine to sit: Supervision Sit to supine: Supervision        Transfers Overall transfer level: Needs assistance Equipment used: Rolling walker (2 wheels) Transfers: Sit to/from Stand Sit to Stand: Min guard           General transfer comment: pt requires cues for hand placement, unable to retain cues later in session. Pt performs 6 sit to stands with RW, one attempt without RW but unable to stand successfully    Ambulation/Gait Ambulation/Gait assistance: Min assist;+2 safety/equipment Gait Distance (Feet): 80 Feet Assistive device: Rolling walker (2 wheels) Gait Pattern/deviations: Step-through pattern;Ataxic Gait velocity: reduced Gait velocity interpretation: <1.31 ft/sec, indicative of household ambulator   General Gait Details: pt with step-through gait, increased stance time and time to advance each LE. Verbal cues to maintain RW closer to BOS   Stairs             Wheelchair Mobility    Modified Rankin (Stroke Patients Only)       Balance Overall balance assessment: Needs assistance Sitting-balance support: No upper extremity supported;Feet supported Sitting balance-Leahy Scale: Fair     Standing balance support: Bilateral upper extremity supported;Reliant on assistive device for balance Standing balance-Leahy Scale: Poor Standing balance comment: minA with BUE support of RW                            Cognition Arousal/Alertness: Awake/alert Behavior During Therapy: Flat affect Overall Cognitive Status: Impaired/Different from baseline Area of Impairment: Problem solving;Memory                     Memory: Decreased short-term memory       Problem Solving: Slow processing          Exercises      General Comments General comments (skin integrity, edema,  etc.): pt denies symptoms of orthostatic BP, VSS during session. 5x sit to stand in 32 seconds      Pertinent Vitals/Pain Pain Assessment: No/denies pain     Home Living                          Prior Function            PT Goals (current goals can now be found in the care plan section) Acute Rehab PT Goals Patient Stated Goal: Get better and return home to kids Progress towards PT goals: Progressing toward goals    Frequency    Min 4X/week      PT Plan Current plan remains appropriate    Co-evaluation              AM-PAC PT "6 Clicks" Mobility   Outcome Measure  Help needed turning from your back to your side while in a flat bed without using bedrails?: A Little Help needed moving from lying on your back to sitting on the side of a flat bed without using bedrails?: A Little Help needed moving to and from a bed to a chair (including a wheelchair)?: A Little Help needed standing up from a chair using your arms (e.g., wheelchair or bedside chair)?: A Little Help needed to walk in hospital room?: Total Help needed climbing 3-5 steps with a railing? : Total 6 Click Score: 14    End of Session   Activity Tolerance: Patient tolerated treatment well Patient left: in bed;with call bell/phone within reach;with family/visitor present Nurse Communication: Mobility status PT Visit Diagnosis: Other symptoms and signs involving the nervous system (R29.898);Dizziness and giddiness (R42)     Time: 2094-7096 PT Time Calculation (min) (ACUTE ONLY): 23 min  Charges:  $Gait Training: 8-22 mins $Therapeutic Activity: 8-22 mins                     Zenaida Niece, PT, DPT Acute Rehabilitation Pager: [phone] Office [place] 02/28/2021, 3:09 PM

## 2021-02-28 NOTE — Plan of Care (Signed)
Problem: Safety: Goal: Non-violent Restraint(s) Outcome: Completed/Met   Problem: Education: Goal: Knowledge of General Education information will improve Description: Including pain rating scale, medication(s)/side effects and non-pharmacologic comfort measures Outcome: Progressing

## 2021-02-28 NOTE — Plan of Care (Signed)
VZV PCR CSF negative. D/C acyclovir ID will sign off.

## 2021-02-28 NOTE — Progress Notes (Addendum)
Inpatient Rehab Admissions Coordinator:   Met with patient and partner, Fredrich Birks, at bedside to discuss recommendations for CIR as well as expectations and goals of rehab stay.  I reviewed 3 hrs/day of therapy, average LOS to be about 2 weeks, and goals of supervision to independent at home.  We discussed that we would like to see her complete her course of IV solumedrol and have medical workup completed prior to rehab admission so we are looking into sometime next week.  Will follow progress with therapy as well to see if she still meets therapy criteria for CIR next week.  Will need insurance auth, pt states she has [employer], not [employer].  Will verify coverage and open with appropriate payor today.    Appears pt does have [employer].  Requested prior auth be started.   Shann Medal, PT, DPT Admissions Coordinator [phone] 02/28/21  12:24 PM

## 2021-02-28 NOTE — Progress Notes (Signed)
Neurology progress note  S: Patient is alert today with no focal deficits and no complaints  Data:  MRI brain wwo: multiple small T2/FLAIR hyperintensities in bilateral white matter abnl for age, none enhance (personal review)  O:  Vitals:   02/28/21 1500 02/28/21 1619  BP:  103/77  Pulse:  100  Resp:  16  Temp: 97.7 F (36.5 C) 97.7 F (36.5 C)  SpO2:  97%    Physical Exam Gen: A&Ox4, NAD HEENT: Atraumatic, normocephalic; oropharynx clear, tongue without atrophy or fasciculations. Resp: CTAB, normal work of breathing CV: RRR, extremities appear well-perfused. Abd: soft/NT/ND Extrem: Nml bulk; no cyanosis, clubbing, or edema.  Neuro: *MS: A&O x4. Follows multi-step commands.  *Speech: no dysarthria or aphasia, able to name and repeat. *CN:    I: Deferred   II,III: PERRLA, VFF by confrontation, optic discs not visualized 2/2 pupillary constriction   III,IV,VI: EOMI w/o nystagmus, no ptosis   V: Sensation intact from V1 to V3 to LT   VII: Eyelid closure was full.  Smile symmetric.   VIII: Hearing intact to voice   IX,X: Voice normal, palate elevates symmetrically    XI: SCM/trap 5/5 bilat   XII: Tongue protrudes midline, no atrophy or fasciculations  *Motor:   Normal bulk.  No tremor, rigidity or bradykinesia. No pronator drift.   Strength: Dlt Bic Tri WE WrF FgS Gr HF KnF KnE PlF DoF    Left 5 5 5 5 5 5 5 5 5 5 5 5     Right 5 5 5 5 5 5 5 5 5 5 5 5    *Sensory: Intact to light touch, pinprick, temperature vibration throughout. Symmetric. Propioception intact bilat.  No double-simultaneous extinction.  *Coordination:  Finger-to-nose, heel-to-shin, rapid alternating motions were intact. *Reflexes:  2+ and symmetric throughout without clonus; toes down-going bilat *Gait: deferred  A/P: Erin French is a 26 y.o. female who is admitted with confusion, fever, chills and lower abdominal pain. Extensive workup with CSF studies with pleocytosis (WBC 21) which has a  neutrophilic predominance, elevated protein to 120 and mild hypoglycorrhachia(Glu: 30). Viral meningitis is on the differential but HSV returned negative. Sjogren's autoAb strongly positive. Increased suspicion for autoimmune encephalopathy. I have sent off serum and CSF panels for autoimmune encephalitis Abs to [facility] clinic which will take approx 2-3 weeks to result. In the meantime we are treating her empirically with high dose steroids (ID is ok with this). I am not sure what to make of her MRI findings, except that it is abnormal and also not c/w viral meningitis and provides evidence in support of alternative etiology (though discrete lesions are less common than more subtle changes in autoimmune encephalitis as well).  - Continue IV solumedrol 1000mg  daily x5 days start 1/6 end date 1/10  - Will continue to follow  Su Monks, MD [facility] [phone]  If 7pm- 7am, please page neurology on call as listed in [place].

## 2021-02-28 NOTE — Progress Notes (Addendum)
PROGRESS NOTE    Erin French  UGQ:[id number]  DOB: [birth year]  DOA: 02/22/2021 PCP: Karleen Dolphin, MD (Inactive) Outpatient Specialists:   Hospital course:  26 year old female with history of chlamydia was admitted last night with altered mental status, fevers chills and lower abdominal pain.  Patient had extensive work-up as she was unable to provide a history.  Work-up was notable for CSF consistent with likely viral meningitis and possible UTI.  Also noted to have UDS positive only for THC, CPK 6000 and normal lactate.  COVID and influenza were negative.  Head CT was negative.  Patient was started on broad-spectrum antibiotics including ceftriaxone, acyclovir to cover meningitis as well as vancomycin and Flagyl.   Subjective: Patient in bed, appears comfortable, denies any headache, no fever, no chest pain or pressure, no shortness of breath , no abdominal pain. No focal weakness.  Objective: Vitals:   02/27/21 2248 02/28/21 0000 02/28/21 0400 02/28/21 0759  BP: 124/68 113/76 129/88 120/71  Pulse:  80 77 98  Resp: 20 15 12 16   Temp: 98.2 F (36.8 C)   99.8 F (37.7 C)  TempSrc: Axillary Axillary Axillary Oral  SpO2: 100% 96% 98% 95%  Weight:      Height:        Intake/Output Summary (Last 24 hours) at 02/28/2021 1100 Last data filed at 02/27/2021 2305 Gross per 24 hour  Intake --  Output 1050 ml  Net -1050 ml   Filed Weights   02/25/21 0500 02/26/21 0500 02/27/21 0500  Weight: 60.7 kg 55.2 kg 54.5 kg     Exam:  Awake Alert, No new F.N deficits, Normal affect [place].AT, right eye small conjunctival hemorrhage on the lateral side Supple Neck, No JVD,   Symmetrical Chest wall movement, Good air movement bilaterally, CTAB RRR,No Gallops, Rubs or new Murmurs,  +ve B.Sounds, Abd Soft, No tenderness,   No Cyanosis, Clubbing or edema     Assessment & Plan:   26 year old female was admitted with fever and altered mental status with work-up suggestive of possible  viral meningitis vs primary presentation of autoimmune disease.  Altered mental status with fever followed by hypothermia/severe sepsis -  Appreciate neurology and ID consultations--this seems to be a viral meningitis versus autoimmune meningitis/encephalitis.  Prelim blood work noted to be positive for ssa (ro) antibody igg, ANA positive, IV acyclovir has been stopped since HSV and VZV serology was negative, currently on 5 days of high-dose IV Solu-Medrol for autoimmune encephalitis/meningitis.  MRI nonspecific changes.  Appreciate neurology and ID input.  Most likely finish 5-day IV steroids and discharged home on low-dose oral steroid for possible Addison's insufficiency with outpatient neurology and endocrine follow-up  R/O Addison's disease -she had hyponatremia with hyperkalemia, hypothermia, orthostatic hypotension.  Likely has adrenal insufficiency unfortunately she is on high doses of Decadron.  Plan will be to place her on low-dose steroids with outpatient endocrine follow-up  Orthostatic hypotension.  See above, add TEDs, IVF continue.  Hypothermia - resolved.  AKI in setting of mild elevation of CPK - resolved with hydration   Axillary adenopathy - follow outpatient with surgery for excision biopsy.  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  Outpatient follow-up with ID per ID.  No cough or pulmonary symptoms.   DVT prophylaxis: Lovenox Code Status: Full Family Communication: Spoke with patient's partner Delana Meyer who was at bedside on 02/26/21, 02/27/2021, bedside 02/28/2021 disposition  Plan:   Patient is from: Home  Anticipated Discharge Location: Home  Barriers to Discharge: Acutely ill and septic  Is patient medically stable for Discharge: No   Scheduled Meds:  enoxaparin (LOVENOX) injection  40 mg Subcutaneous Q24H   insulin aspart  0-9 Units Subcutaneous TID  WC   sodium chloride flush  3 mL Intravenous Q12H   Continuous Infusions:  methylPREDNISolone (SOLU-MEDROL) injection 1,000 mg (02/28/21 1029)    Data Reviewed:  Basic Metabolic Panel: Recent Labs  Lab 02/23/21 0741 02/23/21 5631 02/24/21 0243 02/25/21 0031 02/26/21 0050 02/27/21 0059 02/28/21 0106  NA 131*   < > 133* 133* 133* 131* 133*  K 5.2*   < > 4.6 4.3 3.9 4.2 3.8  CL 101  --  102 103 103 102 103  CO2 21*  --  22 24 24 23 27   GLUCOSE 116*  --  116* 158* 155* 135* 113*  BUN 14  --  12 13 11 11 10   CREATININE 1.03*  --  0.70 0.62 0.70 0.64 0.64  CALCIUM 8.2*  --  8.3* 8.3* 8.2* 7.9* 8.2*  MG 2.0  --   --  2.0  --  2.3 2.2  PHOS 3.7  --   --   --   --   --   --    < > = values in this interval not displayed.    CBC: Recent Labs  Lab 02/24/21 0243 02/25/21 0031 02/26/21 0050 02/27/21 0059 02/28/21 0106  WBC 6.7 6.7 7.3 8.1 7.9  NEUTROABS 6.1 6.1 6.8 7.2 6.3  HGB 9.4* 8.8* 8.4* 8.2* 7.8*  HCT 27.7* 25.6* 25.5* 25.1* 23.9*  MCV 75.7* 75.5* 76.8* 76.8* 77.9*  PLT 141* 115* 151 198 260    Studies: MR BRAIN W WO CONTRAST  Result Date: 02/27/2021 CLINICAL DATA:  Suspect viral meningitis versus autoimmune encephalitis. Fever and altered mental status. EXAM: MRI HEAD WITHOUT AND WITH CONTRAST TECHNIQUE: Multiplanar, multiecho pulse sequences of the brain and surrounding structures were obtained without and with intravenous contrast. CONTRAST:  61mL GADAVIST GADOBUTROL 1 MMOL/ML IV SOLN COMPARISON:  CT head 02/22/2021 FINDINGS: Brain: Ventricle size and cerebral volume normal. Negative for acute infarct. Negative for hemorrhage or mass. No evidence of herpes encephalitis. Scattered small deep white matter hyperintensities bilaterally. Small right frontal periventricular white matter hyperintensity. Brainstem and cerebellum normal. No fluid collection. Normal enhancement. Postcontrast imaging degraded by motion. Allowing for this, no abnormal leptomeningeal enhancement  identified. Vascular: Normal arterial flow voids. Skull and upper cervical spine: Negative Sinuses/Orbits: Mild mucosal edema paranasal sinuses. Mild mastoid mucosal edema bilaterally. Negative orbit Other: None IMPRESSION: Multiple small white matter hyperintensities, abnormal for 26 year old. Differential diagnosis includes demyelinating disease, vasculitis, chronic ischemia, and other chronic white matter inflammatory disorders. None of these enhance are exhibits mass-effect. No areas of restricted diffusion No enhancing lesion.  No intracranial fluid collection. Electronically Signed   By: Franchot Gallo M.D.   On: 02/27/2021 11:52    Principal Problem:   Severe sepsis (HCC) Active Problems:   AKI (acute kidney injury) ([place])   Acute encephalopathy   Hyponatremia   Elevated transaminase level   Axillary lymphadenopathy  Signature  Lala Lund M.D on 02/28/2021 at 11:00 AM   -  To page go to [email or web address]       LOS: 6 days

## 2021-03-01 DIAGNOSIS — A419 Sepsis, unspecified organism: Secondary | ICD-10-CM | POA: Diagnosis not present

## 2021-03-01 DIAGNOSIS — G9340 Encephalopathy, unspecified: Secondary | ICD-10-CM | POA: Diagnosis not present

## 2021-03-01 DIAGNOSIS — R6520 Severe sepsis without septic shock: Secondary | ICD-10-CM | POA: Diagnosis not present

## 2021-03-01 LAB — CBC WITH DIFFERENTIAL/PLATELET
Abs Immature Granulocytes: 0.28 10*3/uL — ABNORMAL HIGH (ref 0.00–0.07)
Basophils Absolute: 0 10*3/uL (ref 0.0–0.1)
Basophils Relative: 0 %
Eosinophils Absolute: 0 10*3/uL (ref 0.0–0.5)
Eosinophils Relative: 0 %
HCT: 28.5 % — ABNORMAL LOW (ref 36.0–46.0)
Hemoglobin: 9.2 g/dL — ABNORMAL LOW (ref 12.0–15.0)
Immature Granulocytes: 4 %
Lymphocytes Relative: 10 %
Lymphs Abs: 0.7 10*3/uL (ref 0.7–4.0)
MCH: 25.6 pg — ABNORMAL LOW (ref 26.0–34.0)
MCHC: 32.3 g/dL (ref 30.0–36.0)
MCV: 79.2 fL — ABNORMAL LOW (ref 80.0–100.0)
Monocytes Absolute: 0.6 10*3/uL (ref 0.1–1.0)
Monocytes Relative: 8 %
Neutro Abs: 5.2 10*3/uL (ref 1.7–7.7)
Neutrophils Relative %: 78 %
Platelets: 309 10*3/uL (ref 150–400)
RBC: 3.6 MIL/uL — ABNORMAL LOW (ref 3.87–5.11)
RDW: 14.6 % (ref 11.5–15.5)
WBC: 6.7 10*3/uL (ref 4.0–10.5)
nRBC: 0 % (ref 0.0–0.2)

## 2021-03-01 LAB — COMPREHENSIVE METABOLIC PANEL
ALT: 46 U/L — ABNORMAL HIGH (ref 0–44)
AST: 44 U/L — ABNORMAL HIGH (ref 15–41)
Albumin: 2.8 g/dL — ABNORMAL LOW (ref 3.5–5.0)
Alkaline Phosphatase: 26 U/L — ABNORMAL LOW (ref 38–126)
Anion gap: 4 — ABNORMAL LOW (ref 5–15)
BUN: 11 mg/dL (ref 6–20)
CO2: 28 mmol/L (ref 22–32)
Calcium: 8.7 mg/dL — ABNORMAL LOW (ref 8.9–10.3)
Chloride: 102 mmol/L (ref 98–111)
Creatinine, Ser: 0.66 mg/dL (ref 0.44–1.00)
GFR, Estimated: >60 mL/min (ref >60)
Glucose, Bld: 109 mg/dL — ABNORMAL HIGH (ref 70–99)
Potassium: 3.9 mmol/L (ref 3.5–5.1)
Sodium: 134 mmol/L — ABNORMAL LOW (ref 135–145)
Total Bilirubin: 0.6 mg/dL (ref 0.3–1.2)
Total Protein: 6 g/dL — ABNORMAL LOW (ref 6.5–8.1)

## 2021-03-01 LAB — GLUCOSE, CAPILLARY
Glucose-Capillary: 152 mg/dL — ABNORMAL HIGH (ref 70–99)
Glucose-Capillary: 87 mg/dL (ref 70–99)
Glucose-Capillary: 156 mg/dL — ABNORMAL HIGH (ref 70–99)
Glucose-Capillary: 170 mg/dL — ABNORMAL HIGH (ref 70–99)

## 2021-03-01 LAB — LYME DISEASE DNA BY PCR(BORRELIA BURG): Lyme Disease(B.burgdorferi)PCR: NEGATIVE

## 2021-03-01 LAB — MAGNESIUM: Magnesium: 2.4 mg/dL (ref 1.7–2.4)

## 2021-03-01 NOTE — Plan of Care (Signed)

## 2021-03-01 NOTE — Progress Notes (Signed)
PROGRESS NOTE    Erin French  ZOX:[id number]  DOB: Apr 20, 1995  DOA: 02/22/2021 PCP: Karleen Dolphin, MD (Inactive) Outpatient Specialists:   Hospital course:  26 year old female with history of chlamydia was admitted last night with altered mental status, fevers chills and lower abdominal pain.  Patient had extensive work-up as she was unable to provide a history.  Work-up was notable for CSF consistent with likely viral meningitis and possible UTI.  Also noted to have UDS positive only for THC, CPK 6000 and normal lactate.  COVID and influenza were negative.  Head CT was negative.  Patient was started on broad-spectrum antibiotics including ceftriaxone, acyclovir to cover meningitis as well as vancomycin and Flagyl.   Subjective: Patient in bed, appears comfortable, denies any headache, no fever, no chest pain or pressure, no shortness of breath , no abdominal pain. No new focal weakness.   Objective: Vitals:   02/28/21 2037 03/01/21 0000 03/01/21 0400 03/01/21 0802  BP: (!) 125/96 115/73 123/87 117/82  Pulse: 73 72 62 82  Resp: 17 18 18  (!) 25  Temp: 98.2 F (36.8 C) 98.3 F (36.8 C) 97.9 F (36.6 C) 97.8 F (36.6 C)  TempSrc: Oral Oral Oral Oral  SpO2: 99% 94% 96% 97%  Weight:      Height:        Intake/Output Summary (Last 24 hours) at 03/01/2021 1113 Last data filed at 02/28/2021 1600 Gross per 24 hour  Intake 66 ml  Output --  Net 66 ml   Filed Weights   02/25/21 0500 02/26/21 0500 02/27/21 0500  Weight: 60.7 kg 55.2 kg 54.5 kg     Exam:  Awake Alert, No new F.N deficits, Normal affect [place].AT,PERRAL Supple Neck, No JVD,   Symmetrical Chest wall movement, Good air movement bilaterally, CTAB RRR,No Gallops, Rubs or new Murmurs,  +ve B.Sounds, Abd Soft, No tenderness,   No Cyanosis, Clubbing or edema      Assessment & Plan:   26 year old female was admitted with fever and altered mental status with work-up suggestive of possible viral meningitis vs  primary presentation of autoimmune disease.  Altered mental status with fever followed by hypothermia/severe sepsis -  Appreciate neurology and ID consultations--this seems to be a viral meningitis versus autoimmune meningitis/encephalitis.  Prelim blood work noted to be positive for ssa (ro) antibody igg, ANA positive, IV acyclovir has been stopped since HSV and VZV serology was negative, currently on 5 days of high-dose IV Solu-Medrol for autoimmune encephalitis/meningitis.  MRI nonspecific changes.  Appreciate neurology and ID input.  Most likely finish 5-day IV steroids and discharged home on low-dose oral steroid for possible Addison's insufficiency with outpatient neurology and endocrine follow-up  R/O Addison's disease -she had hyponatremia with hyperkalemia, hypothermia, orthostatic hypotension.  Likely has adrenal insufficiency unfortunately she is on high doses of Decadron.  Plan will be to place her on low-dose steroids with outpatient endocrine follow-up  Orthostatic hypotension.  See above, add TEDs, IVF continue.  Hypothermia - resolved.  AKI in setting of mild elevation of CPK - resolved with hydration   Axillary adenopathy - follow outpatient with surgery for excision biopsy.  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  Outpatient follow-up with ID per ID.  No cough or pulmonary symptoms.   DVT prophylaxis: Lovenox Code Status: Full Family Communication: Spoke with patient's partner Delana Meyer who was at bedside on 02/26/21, 02/27/2021, bedside  02/28/2021, 03/01/21  disposition Plan:   Patient is from: Home  Anticipated Discharge Location: Home  Barriers to Discharge: Acutely ill and septic  Is patient medically stable for Discharge: No   Scheduled Meds:  enoxaparin (LOVENOX) injection  40 mg Subcutaneous Q24H   insulin aspart  0-9 Units Subcutaneous TID WC    sodium chloride flush  3 mL Intravenous Q12H   Continuous Infusions:  methylPREDNISolone (SOLU-MEDROL) injection 1,000 mg (02/28/21 1029)    Data Reviewed:  Basic Metabolic Panel: Recent Labs  Lab 02/23/21 0741 02/23/21 7902 02/25/21 0031 02/26/21 0050 02/27/21 0059 02/28/21 0106 03/01/21 0615  NA 131*   < > 133* 133* 131* 133* 134*  K 5.2*   < > 4.3 3.9 4.2 3.8 3.9  CL 101   < > 103 103 102 103 102  CO2 21*   < > 24 24 23 27 28   GLUCOSE 116*   < > 158* 155* 135* 113* 109*  BUN 14   < > 13 11 11 10 11   CREATININE 1.03*   < > 0.62 0.70 0.64 0.64 0.66  CALCIUM 8.2*   < > 8.3* 8.2* 7.9* 8.2* 8.7*  MG 2.0  --  2.0  --  2.3 2.2 2.4  PHOS 3.7  --   --   --   --   --   --    < > = values in this interval not displayed.    CBC: Recent Labs  Lab 02/25/21 0031 02/26/21 0050 02/27/21 0059 02/28/21 0106 03/01/21 0615  WBC 6.7 7.3 8.1 7.9 6.7  NEUTROABS 6.1 6.8 7.2 6.3 5.2  HGB 8.8* 8.4* 8.2* 7.8* 9.2*  HCT 25.6* 25.5* 25.1* 23.9* 28.5*  MCV 75.5* 76.8* 76.8* 77.9* 79.2*  PLT 115* 151 198 260 309    Studies: MR BRAIN W WO CONTRAST  Result Date: 02/27/2021 CLINICAL DATA:  Suspect viral meningitis versus autoimmune encephalitis. Fever and altered mental status. EXAM: MRI HEAD WITHOUT AND WITH CONTRAST TECHNIQUE: Multiplanar, multiecho pulse sequences of the brain and surrounding structures were obtained without and with intravenous contrast. CONTRAST:  53mL GADAVIST GADOBUTROL 1 MMOL/ML IV SOLN COMPARISON:  CT head 02/22/2021 FINDINGS: Brain: Ventricle size and cerebral volume normal. Negative for acute infarct. Negative for hemorrhage or mass. No evidence of herpes encephalitis. Scattered small deep white matter hyperintensities bilaterally. Small right frontal periventricular white matter hyperintensity. Brainstem and cerebellum normal. No fluid collection. Normal enhancement. Postcontrast imaging degraded by motion. Allowing for this, no abnormal leptomeningeal enhancement  identified. Vascular: Normal arterial flow voids. Skull and upper cervical spine: Negative Sinuses/Orbits: Mild mucosal edema paranasal sinuses. Mild mastoid mucosal edema bilaterally. Negative orbit Other: None IMPRESSION: Multiple small white matter hyperintensities, abnormal for 26 year old. Differential diagnosis includes demyelinating disease, vasculitis, chronic ischemia, and other chronic white matter inflammatory disorders. None of these enhance are exhibits mass-effect. No areas of restricted diffusion No enhancing lesion.  No intracranial fluid collection. Electronically Signed   By: Franchot Gallo M.D.   On: 02/27/2021 11:52    Principal Problem:   Severe sepsis (HCC) Active Problems:   AKI (acute kidney injury) ([place])   Acute encephalopathy   Hyponatremia   Elevated transaminase level   Axillary lymphadenopathy  Signature  Lala Lund M.D on 03/01/2021 at 11:13 AM   -  To page go to [email or web address]       LOS: 7 days

## 2021-03-01 NOTE — Progress Notes (Signed)
Neurology progress note  S: Patient states she feels better every day on steroids, fully oriented, no focal deficits  Data:  MRI brain wwo: multiple small T2/FLAIR hyperintensities in bilateral white matter abnl for age, none enhance (personal review) CTA c/a/p w contrast: no e/o malignancy  O:  Vitals:   03/01/21 1157 03/01/21 1640  BP: 116/68 133/90  Pulse: 77 76  Resp: 20 16  Temp: 98.1 F (36.7 C) 98.3 F (36.8 C)  SpO2: 100%     Physical Exam Gen: A&Ox4, NAD HEENT: Atraumatic, normocephalic; oropharynx clear, tongue without atrophy or fasciculations. Resp: CTAB, normal work of breathing CV: RRR, extremities appear well-perfused. Abd: soft/NT/ND Extrem: Nml bulk; no cyanosis, clubbing, or edema.  Neuro: *MS: A&O x4. Follows multi-step commands.  *Speech: no dysarthria or aphasia, able to name and repeat. *CN:    I: Deferred   II,III: PERRLA, VFF by confrontation, optic discs not visualized 2/2 pupillary constriction   III,IV,VI: EOMI w/o nystagmus, no ptosis   V: Sensation intact from V1 to V3 to LT   VII: Eyelid closure was full.  Smile symmetric.   VIII: Hearing intact to voice   IX,X: Voice normal, palate elevates symmetrically    XI: SCM/trap 5/5 bilat   XII: Tongue protrudes midline, no atrophy or fasciculations  *Motor:   Normal bulk.  No tremor, rigidity or bradykinesia. No pronator drift.   Strength: Dlt Bic Tri WE WrF FgS Gr HF KnF KnE PlF DoF    Left 5 5 5 5 5 5 5 5 5 5 5 5     Right 5 5 5 5 5 5 5 5 5 5 5 5    *Sensory: Intact to light touch, pinprick, temperature vibration throughout. Symmetric. Propioception intact bilat.  No double-simultaneous extinction.  *Coordination:  Finger-to-nose, heel-to-shin, rapid alternating motions were intact. *Reflexes:  2+ and symmetric throughout without clonus; toes down-going bilat *Gait: deferred  A/P: Erin French is a 26 y.o. female who is admitted with confusion, fever, chills and lower abdominal pain.  Extensive workup with CSF studies with pleocytosis (WBC 21) which has a neutrophilic predominance, elevated protein to 120 and mild hypoglycorrhachia(Glu: 30). Viral meningitis is on the differential but HSV returned negative. 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 I have sent off serum and CSF panels for autoimmune encephalitis Abs to [facility] clinic which will take approx 2-3 weeks to result. In the meantime recommend empiric course IV solumedrol 1g daily x5 days. ID is amenable to this. I am not sure what to make of her MRI findings, except that it is abnormal and also not c/w viral meningitis and provides evidence in support of alternative etiology (though discrete lesions are less common than more subtle changes in autoimmune encephalitis as well). CTA c/a/p w contrast showed no e/o malignancy.  - Continue IV solumedrol 1000mg  daily x5 days end date 1/10  - Will continue to follow  Su Monks, MD [facility] [phone]  If 7pm- 7am, please page neurology on call as listed in [place].

## 2021-03-02 DIAGNOSIS — A419 Sepsis, unspecified organism: Secondary | ICD-10-CM | POA: Diagnosis not present

## 2021-03-02 DIAGNOSIS — R6520 Severe sepsis without septic shock: Secondary | ICD-10-CM | POA: Diagnosis not present

## 2021-03-02 DIAGNOSIS — G9340 Encephalopathy, unspecified: Secondary | ICD-10-CM | POA: Diagnosis not present

## 2021-03-02 LAB — CBC WITH DIFFERENTIAL/PLATELET
Abs Immature Granulocytes: 0.19 10*3/uL — ABNORMAL HIGH (ref 0.00–0.07)
Basophils Absolute: 0 10*3/uL (ref 0.0–0.1)
Basophils Relative: 0 %
Eosinophils Absolute: 0 10*3/uL (ref 0.0–0.5)
Eosinophils Relative: 0 %
HCT: 29.3 % — ABNORMAL LOW (ref 36.0–46.0)
Hemoglobin: 9.3 g/dL — ABNORMAL LOW (ref 12.0–15.0)
Immature Granulocytes: 4 %
Lymphocytes Relative: 10 %
Lymphs Abs: 0.5 10*3/uL — ABNORMAL LOW (ref 0.7–4.0)
MCH: 25.7 pg — ABNORMAL LOW (ref 26.0–34.0)
MCHC: 31.7 g/dL (ref 30.0–36.0)
MCV: 80.9 fL (ref 80.0–100.0)
Monocytes Absolute: 0.2 10*3/uL (ref 0.1–1.0)
Monocytes Relative: 4 %
Neutro Abs: 4.5 10*3/uL (ref 1.7–7.7)
Neutrophils Relative %: 82 %
Platelets: 303 10*3/uL (ref 150–400)
RBC: 3.62 MIL/uL — ABNORMAL LOW (ref 3.87–5.11)
RDW: 14.6 % (ref 11.5–15.5)
WBC: 5.5 10*3/uL (ref 4.0–10.5)
nRBC: 0.4 % — ABNORMAL HIGH (ref 0.0–0.2)

## 2021-03-02 LAB — COMPREHENSIVE METABOLIC PANEL
ALT: 41 U/L (ref 0–44)
AST: 32 U/L (ref 15–41)
Albumin: 2.9 g/dL — ABNORMAL LOW (ref 3.5–5.0)
Alkaline Phosphatase: 28 U/L — ABNORMAL LOW (ref 38–126)
Anion gap: 6 (ref 5–15)
BUN: 12 mg/dL (ref 6–20)
CO2: 30 mmol/L (ref 22–32)
Calcium: 8.9 mg/dL (ref 8.9–10.3)
Chloride: 102 mmol/L (ref 98–111)
Creatinine, Ser: 0.68 mg/dL (ref 0.44–1.00)
GFR, Estimated: >60 mL/min (ref >60)
Glucose, Bld: 138 mg/dL — ABNORMAL HIGH (ref 70–99)
Potassium: 4.3 mmol/L (ref 3.5–5.1)
Sodium: 138 mmol/L (ref 135–145)
Total Bilirubin: 0.6 mg/dL (ref 0.3–1.2)
Total Protein: 6.1 g/dL — ABNORMAL LOW (ref 6.5–8.1)

## 2021-03-02 LAB — GLUCOSE, CAPILLARY
Glucose-Capillary: 114 mg/dL — ABNORMAL HIGH (ref 70–99)
Glucose-Capillary: 130 mg/dL — ABNORMAL HIGH (ref 70–99)
Glucose-Capillary: 181 mg/dL — ABNORMAL HIGH (ref 70–99)
Glucose-Capillary: 122 mg/dL — ABNORMAL HIGH (ref 70–99)

## 2021-03-02 LAB — MAGNESIUM: Magnesium: 2.3 mg/dL (ref 1.7–2.4)

## 2021-03-02 NOTE — Progress Notes (Signed)
Tele tech reports that patient has been running sinus brady with the lowest heart rate being 49. Pt asymptomatic. Provider E. Chen aware. Will continue to monitor, call bell within reach.

## 2021-03-02 NOTE — Progress Notes (Signed)
PROGRESS NOTE    Erin French  QBH:[id number]  DOB: 07/07/1995  DOA: 02/22/2021 PCP: Karleen Dolphin, MD (Inactive) Outpatient Specialists:   Hospital course:  26 year old female with history of chlamydia was admitted last night with altered mental status, fevers chills and lower abdominal pain.  Patient had extensive work-up as she was unable to provide a history.  Work-up was notable for CSF consistent with likely viral meningitis and possible UTI.  Also noted to have UDS positive only for THC, CPK 6000 and normal lactate.  COVID and influenza were negative.  Head CT was negative.  Patient was started on broad-spectrum antibiotics including ceftriaxone, acyclovir to cover meningitis as well as vancomycin and Flagyl.   Subjective: Patient in bed, appears comfortable, denies any headache, no fever, no chest pain or pressure, no shortness of breath , no abdominal pain. No new focal weakness.    Objective: Vitals:   03/02/21 0325 03/02/21 0500 03/02/21 0731 03/02/21 1148  BP: 116/77  124/90 120/77  Pulse:    74  Resp: 20  15 19   Temp: 98.4 F (36.9 C)  97.6 F (36.4 C) 98.1 F (36.7 C)  TempSrc: Oral  Oral Oral  SpO2: 96%     Weight:  52 kg    Height:        Intake/Output Summary (Last 24 hours) at 03/02/2021 1202 Last data filed at 03/02/2021 1039 Gross per 24 hour  Intake 1099.2 ml  Output 350 ml  Net 749.2 ml   Filed Weights   02/26/21 0500 02/27/21 0500 03/02/21 0500  Weight: 55.2 kg 54.5 kg 52 kg     Exam:  Awake Alert, No new F.N deficits, Normal affect [place].AT,PERRAL Supple Neck, No JVD,   Symmetrical Chest wall movement, Good air movement bilaterally, CTAB RRR,No Gallops, Rubs or new Murmurs,  +ve B.Sounds, Abd Soft, No tenderness,   No Cyanosis, Clubbing or edema    Assessment & Plan:   26 year old female was admitted with fever and altered mental status with work-up suggestive of possible viral meningitis vs primary presentation of autoimmune  disease.  Altered mental status with fever followed by hypothermia/severe sepsis -  Appreciate neurology and ID consultations--this seems to be a viral meningitis versus autoimmune meningitis/encephalitis.  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  MRI nonspecific changes.  Appreciate neurology and ID input.  Most likely finish 5-day IV steroids and discharged home on low-dose oral steroid for possible Addison's insufficiency with outpatient neurology and endocrine follow-up  R/O Addison's disease -she had hyponatremia with hyperkalemia, hypothermia, orthostatic hypotension.  Likely has adrenal insufficiency unfortunately she is on high doses of Decadron.  Plan will be to place her on low-dose steroids with outpatient endocrine follow-up  Orthostatic hypotension.  See above, add TEDs, IVF continue.  Hypothermia - resolved.  AKI in setting of mild elevation of CPK - resolved with hydration   Axillary adenopathy - follow outpatient with surgery for excision biopsy.  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  Outpatient follow-up with ID per ID.  No cough or pulmonary symptoms.   DVT prophylaxis: Lovenox Code Status: Full Family Communication: Spoke with patient's partner Delana Meyer who was at bedside on 02/26/21, 02/27/2021,  bedside 02/28/2021, 03/01/21  disposition Plan:   Patient is from: Home  Anticipated Discharge Location: Home  Barriers to Discharge: Acutely ill and septic  Is patient medically stable for Discharge: No   Scheduled Meds:  enoxaparin (LOVENOX) injection  40 mg Subcutaneous Q24H   insulin aspart  0-9 Units Subcutaneous TID WC   sodium  chloride flush  3 mL Intravenous Q12H   Continuous Infusions:  methylPREDNISolone (SOLU-MEDROL) injection 66 mL/hr at 03/02/21 0815    Data Reviewed:  Basic Metabolic Panel: Recent Labs  Lab 02/25/21 0031 02/26/21 0050 02/27/21 0059 02/28/21 0106 03/01/21 0615 03/02/21 0041  NA 133* 133* 131* 133* 134* 138  K 4.3 3.9 4.2 3.8 3.9 4.3  CL 103 103 102 103 102 102  CO2 24 24 23 27 28 30   GLUCOSE 158* 155* 135* 113* 109* 138*  BUN 13 11 11 10 11 12   CREATININE 0.62 0.70 0.64 0.64 0.66 0.68  CALCIUM 8.3* 8.2* 7.9* 8.2* 8.7* 8.9  MG 2.0  --  2.3 2.2 2.4 2.3    CBC: Recent Labs  Lab 02/26/21 0050 02/27/21 0059 02/28/21 0106 03/01/21 0615 03/02/21 0041  WBC 7.3 8.1 7.9 6.7 5.5  NEUTROABS 6.8 7.2 6.3 5.2 4.5  HGB 8.4* 8.2* 7.8* 9.2* 9.3*  HCT 25.5* 25.1* 23.9* 28.5* 29.3*  MCV 76.8* 76.8* 77.9* 79.2* 80.9  PLT 151 198 260 309 303    Studies: No results found.  Principal Problem:   Severe sepsis ([place]) Active Problems:   AKI (acute kidney injury) ([place])   Acute encephalopathy   Hyponatremia   Elevated transaminase level   Axillary lymphadenopathy  Signature  Lala Lund M.D on 03/02/2021 at 12:02 PM   -  To page go to [email or web address]       LOS: 8 days

## 2021-03-03 DIAGNOSIS — G9340 Encephalopathy, unspecified: Secondary | ICD-10-CM | POA: Diagnosis not present

## 2021-03-03 DIAGNOSIS — R4182 Altered mental status, unspecified: Secondary | ICD-10-CM | POA: Diagnosis not present

## 2021-03-03 DIAGNOSIS — A419 Sepsis, unspecified organism: Secondary | ICD-10-CM | POA: Diagnosis not present

## 2021-03-03 DIAGNOSIS — R6520 Severe sepsis without septic shock: Secondary | ICD-10-CM | POA: Diagnosis not present

## 2021-03-03 LAB — GLUCOSE, CAPILLARY
Glucose-Capillary: 115 mg/dL — ABNORMAL HIGH (ref 70–99)
Glucose-Capillary: 129 mg/dL — ABNORMAL HIGH (ref 70–99)
Glucose-Capillary: 138 mg/dL — ABNORMAL HIGH (ref 70–99)
Glucose-Capillary: 196 mg/dL — ABNORMAL HIGH (ref 70–99)

## 2021-03-03 NOTE — Progress Notes (Addendum)
Neurology Progress Note  Subjective: No acute overnight events. Patient states she is feeling "much better" today just endorses feeling tired and wanting to sleep.   Exam: Vitals:   03/03/21 0133 03/03/21 0852  BP: (!) 124/91 (!) 114/58  Pulse: 73 83  Resp: 19 16  Temp: 97.7 F (36.5 C) 98.3 F (36.8 C)  SpO2: 100% 99%   Gen: Laying in hospital bed, in no acute distress Resp: non-labored breathing, no respiratory distress Abd: soft, non-tender, non-distended  Neuro: Mental Status: Awake, alert, and oriented to self, age, month, place, situation, and location.  Speech is intact without dysarthria. Good attention noted.  No neglect is noted. Patient follows commands without difficulty.  Cranial Nerves: PERRL, EOMI, facial sensation is intact and symmetric to light touch, face is symmetric resting and with movement, hearing is intact to voice, tongue and palate are midline.  Motor: 5/5 strength present throughout upper and lower extremities.  Normal tone and bulk.  Sensory: Intact and symmetric to light touch throughout. Gait: Deferred  Pertinent Labs: CBC    Component Value Date/Time   WBC 5.5 03/02/2021 0041   RBC 3.62 (L) 03/02/2021 0041   HGB 9.3 (L) 03/02/2021 0041   HGB 10.2 (L) 09/06/2018 0855   HCT 29.3 (L) 03/02/2021 0041   HCT 31.6 (L) 09/06/2018 0855   PLT 303 03/02/2021 0041   PLT 347 09/06/2018 0855   MCV 80.9 03/02/2021 0041   MCV 87 09/06/2018 0855   MCH 25.7 (L) 03/02/2021 0041   MCHC 31.7 03/02/2021 0041   RDW 14.6 03/02/2021 0041   RDW 12.3 09/06/2018 0855   LYMPHSABS 0.5 (L) 03/02/2021 0041   LYMPHSABS 1.2 06/07/2018 1611   MONOABS 0.2 03/02/2021 0041   EOSABS 0.0 03/02/2021 0041   EOSABS 0.0 06/07/2018 1611   BASOSABS 0.0 03/02/2021 0041   BASOSABS 0.0 06/07/2018 1611   CMP     Component Value Date/Time   NA 138 03/02/2021 0041   K 4.3 03/02/2021 0041   CL 102 03/02/2021 0041   CO2 30 03/02/2021 0041   GLUCOSE 138 (H) 03/02/2021 0041    BUN 12 03/02/2021 0041   CREATININE 0.68 03/02/2021 0041   CREATININE 0.83 04/03/2015 1114   CALCIUM 8.9 03/02/2021 0041   PROT 6.1 (L) 03/02/2021 0041   ALBUMIN 2.9 (L) 03/02/2021 0041   AST 32 03/02/2021 0041   ALT 41 03/02/2021 0041   ALKPHOS 28 (L) 03/02/2021 0041   BILITOT 0.6 03/02/2021 0041   GFRNONAA >60 03/02/2021 0041   GFRAA >60 11/18/2018 0610    Latest Reference Range & Units 02/24/21 10:50  SEE BELOW  Comment  Anti Nuclear Antibody (ANA) Negative  Positive !  Anti JO-1 0.0 - 0.9 AI <0.2  CENTROMERE AB SCREEN 0.0 - 0.9 AI <0.2  ds DNA Ab 0 - 9 IU/mL 1  Cytoplasmic (C-ANCA) Neg:<1:20 titer <1:20  P-ANCA Neg:<1:20 titer <1:20  Atypical P-ANCA titer Neg:<1:20 titer <1:20  ENA SM Ab Ser-aCnc 0.0 - 0.9 AI <0.2  Chromatin Ab SerPl-aCnc 0.0 - 0.9 AI <0.2  !: Data is abnormal   Latest Reference Range & Units 02/24/21 10:50  Ribonucleic Protein 0.0 - 0.9 AI 0.2  ENA SM Ab Ser-aCnc 0.0 - 0.9 AI <0.2  SSA (Ro) (ENA) Antibody, IgG 0.0 - 0.9 AI 0.0 - 0.9 AI >8.0 (H) >8.0 (H)  SSB (La) (ENA) Antibody, IgG 0.0 - 0.9 AI 0.0 - 0.9 AI 0.4 0.4  Scleroderma (Scl-70) (ENA) Antibody, IgG 0.0 - 0.9 AI <0.2  (  H): Data is abnormally high  Imaging Reviewed: No new neurologic imaging studies today.   Assessment: 26 y.o. female who was admitted with confusion, fever, chills and lower abdominal pain. Extensive workup included CSF with pleocytosis (WBC 21) which has a neutrophilic predominance, elevated protein to 120 and mild hypoglycorrhachia(Glu: 30). Viral meningitis is on the differential but HSV returned negative. Sjogren's autoAb (ro) strongly positive. 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  - Serum and CSF panels for autoimmune encephalitis send outs pending: Abs to [facility] clinic which will take approx 2-3 weeks to result.  - In the meantime empiric course IV solumedrol 1 g daily x 5 days was recommended and ID is amenable to this.  - Scattered  white matter hyperintensities on MRI brain appear chronic, nonspecific and mild in extent, but clearly are abnormal given the patient's age. DDx for the MRI findings is broad, but certainly includes an autoimmune encephalopathy or a small vessel vasculitis. The MRI findings are not consistent with a viral meningitis. - CTA c/a/p w contrast showed no e/o malignancy. 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 Felecia Shelling of [facility] is an excellent choice)  Anibal Henderson, AGACNP-BC [facility] [phone]  Electronically signed: Dr. Kerney Elbe

## 2021-03-03 NOTE — Plan of Care (Signed)

## 2021-03-03 NOTE — Progress Notes (Signed)
Neurology progress note  S: Patient states she feels better every day on steroids, fully oriented, no focal deficits  Data:  MRI brain wwo: multiple small T2/FLAIR hyperintensities in bilateral white matter abnl for age, none enhance (personal review) CTA c/a/p w contrast: no e/o malignancy  O:  Vitals:   03/02/21 2013 03/03/21 0133  BP: 119/78 (!) 124/91  Pulse: 69 73  Resp: 19 19  Temp: 98.2 F (36.8 C) 97.7 F (36.5 C)  SpO2: 97% 100%    Physical Exam Gen: A&Ox4, NAD HEENT: Atraumatic, normocephalic; oropharynx clear, tongue without atrophy or fasciculations. Resp: CTAB, normal work of breathing CV: RRR, extremities appear well-perfused. Abd: soft/NT/ND Extrem: Nml bulk; no cyanosis, clubbing, or edema.  Neuro: *MS: A&O x4. Follows multi-step commands.  *Speech: no dysarthria or aphasia, able to name and repeat. *CN:    I: Deferred   II,III: PERRLA, VFF by confrontation, optic discs not visualized 2/2 pupillary constriction   III,IV,VI: EOMI w/o nystagmus, no ptosis   V: Sensation intact from V1 to V3 to LT   VII: Eyelid closure was full.  Smile symmetric.   VIII: Hearing intact to voice   IX,X: Voice normal, palate elevates symmetrically    XI: SCM/trap 5/5 bilat   XII: Tongue protrudes midline, no atrophy or fasciculations  *Motor:   Normal bulk.  No tremor, rigidity or bradykinesia. No pronator drift.   Strength: Dlt Bic Tri WE WrF FgS Gr HF KnF KnE PlF DoF    Left 5 5 5 5 5 5 5 5 5 5 5 5     Right 5 5 5 5 5 5 5 5 5 5 5 5    *Sensory: Intact to light touch, pinprick, temperature vibration throughout. Symmetric. Propioception intact bilat.  No double-simultaneous extinction.  *Coordination:  Finger-to-nose, heel-to-shin, rapid alternating motions were intact. *Reflexes:  2+ and symmetric throughout without clonus; toes down-going bilat *Gait: deferred  A/P: Erin French is a 26 y.o. female who is admitted with confusion, fever, chills and lower abdominal  pain. Extensive workup with CSF studies with pleocytosis (WBC 21) which has a neutrophilic predominance, elevated protein to 120 and mild hypoglycorrhachia(Glu: 30). Viral meningitis is on the differential but HSV returned negative. 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 I have sent off serum and CSF panels for autoimmune encephalitis Abs to [facility] clinic which will take approx 2-3 weeks to result. In the meantime I recommended empiric course IV solumedrol 1g daily x5 days. ID is amenable to this. I am not sure what to make of her MRI findings, except that it is abnormal and also not c/w viral meningitis and provides evidence in support of alternative etiology (though discrete lesions are less common than more subtle changes in autoimmune encephalitis as well). CTA c/a/p w contrast showed no e/o malignancy.  - Continue IV solumedrol 1000mg  daily x5 days end date 1/10  - Will continue to follow  Su Monks, MD [facility] [phone]  If 7pm- 7am, please page neurology on call as listed in [place].

## 2021-03-03 NOTE — Progress Notes (Signed)
Inpatient Rehab Admissions Coordinator:   Awaiting determination from insurance regarding CIR prior auth request.  IV solumedrol last dose tomorrow.    Shann Medal, PT, DPT Admissions Coordinator [phone] 03/03/21  10:47 AM

## 2021-03-03 NOTE — Progress Notes (Signed)
Physical Therapy Treatment Patient Details Name: Erin French MRN: [id number] DOB: 22-Nov-1995 Today's Date: 03/03/2021   History of Present Illness 26 y.o. female presenting to the emergency department 02/22/21 with altered mental status. EEG moderate diffuse encephalopathy; possible UTI; viral meningitis suspected per Neuro and ID consults, but later ruled out; ?autoimmune encephalitis; 1/05 MRI brain multiple small T2/FLAIR hyperintensities in bilateral white matter abnl for age   PMH significant for chlamydia    PT Comments    Patient continues to make progress. She remains mildly ataxic with heel to shin and LE placement activities (including during gait). Required min assist for balance with use of RW for gait (+2nd person to manage IV). Educated in bed level exercises for coordination and strengthening for her to do throughout the day.    Recommendations for follow up therapy are one component of a multi-disciplinary discharge planning process, led by the attending physician.  Recommendations may be updated based on patient status, additional functional criteria and insurance authorization.  Follow Up Recommendations  Acute inpatient rehab (3hours/day)     Assistance Recommended at Discharge Frequent or constant Supervision/Assistance  Patient can return home with the following Assistance with cooking/housework;Assist for transportation;Direct supervision/assist for financial management;Direct supervision/assist for medications management;A little help with walking and/or transfers;A little help with bathing/dressing/bathroom;Help with stairs or ramp for entrance   Equipment Recommendations  Rolling walker (2 wheels)    Recommendations for Other Services Rehab consult;OT consult;Speech consult     Precautions / Restrictions Precautions Precautions: Fall Precaution Comments: pt reports some falls at home PTA     Mobility  Bed Mobility Overal bed mobility: Needs  Assistance Bed Mobility: Supine to Sit;Sit to Supine     Supine to sit: Modified independent (Device/Increase time) Sit to supine: Modified independent (Device/Increase time)        Transfers Overall transfer level: Needs assistance Equipment used: Rolling walker (2 wheels);None Transfers: Sit to/from Stand Sit to Stand: Min guard;Min assist           General transfer comment: pt requires cues for hand placement, unable to retain cues later in session. one sit to stand without RW with min assist for balance    Ambulation/Gait Ambulation/Gait assistance: Min assist;+2 safety/equipment   Assistive device: Rolling walker (2 wheels) Gait Pattern/deviations: Step-through pattern;Ataxic Gait velocity: reduced     General Gait Details: pt with step-through gait, increased stance time and time to advance each LE. Verbal cues to maintain RW closer to BOS   Stairs             Wheelchair Mobility    Modified Rankin (Stroke Patients Only)       Balance Overall balance assessment: Needs assistance Sitting-balance support: No upper extremity supported;Feet supported Sitting balance-Leahy Scale: Fair     Standing balance support: Bilateral upper extremity supported;Reliant on assistive device for balance Standing balance-Leahy Scale: Poor Standing balance comment: minguard with BUE support of RW; min assist without UE support due to ant-post sway                            Cognition Arousal/Alertness: Awake/alert Behavior During Therapy: Flat affect Overall Cognitive Status: Impaired/Different from baseline Area of Impairment: Problem solving;Safety/judgement                         Safety/Judgement: Decreased awareness of safety;Decreased awareness of deficits   Problem Solving: Slow processing General Comments: patient required repetition  of safest hand placement with RW despite prior sessions; reports she walked to bathroom with RW by  herself        Exercises Other Exercises Other Exercises: Heel to shin exercises for coordination (at times heel to knee, then to ankle without using shin as a guide). +HOB elevated so pt could see what her feet/legs were doing Other Exercises: Bridging with 5 second hold x 10 reps. Feet apart and trying to keep pelvis level    General Comments        Pertinent Vitals/Pain Pain Assessment: No/denies pain    Home Living                          Prior Function            PT Goals (current goals can now be found in the care plan section) Acute Rehab PT Goals Patient Stated Goal: Get better and return home to kids Time For Goal Achievement: 03/11/21 Potential to Achieve Goals: Good Progress towards PT goals: Progressing toward goals    Frequency    Min 4X/week      PT Plan Current plan remains appropriate    Co-evaluation              AM-PAC PT "6 Clicks" Mobility   Outcome Measure  Help needed turning from your back to your side while in a flat bed without using bedrails?: None Help needed moving from lying on your back to sitting on the side of a flat bed without using bedrails?: None Help needed moving to and from a bed to a chair (including a wheelchair)?: A Little Help needed standing up from a chair using your arms (e.g., wheelchair or bedside chair)?: A Little Help needed to walk in hospital room?: A Little Help needed climbing 3-5 steps with a railing? : Total 6 Click Score: 18    End of Session Equipment Utilized During Treatment: Gait belt Activity Tolerance: Patient tolerated treatment well Patient left: in bed;with call bell/phone within reach;with family/visitor present Nurse Communication: Mobility status PT Visit Diagnosis: Other symptoms and signs involving the nervous system (R29.898);Dizziness and giddiness (R42)     Time: 7001-7494 PT Time Calculation (min) (ACUTE ONLY): 14 min  Charges:  $Gait Training: 8-22 mins                       Arby Barrette, PT Acute Rehabilitation Services  Pager [phone] Office [phone]    Rexanne Mano 03/03/2021, 12:05 PM

## 2021-03-03 NOTE — Progress Notes (Signed)
PROGRESS NOTE    Erin French  XFG:[id number]  DOB: March 09, 1995  DOA: 02/22/2021 PCP: Karleen Dolphin, MD (Inactive) Outpatient Specialists:   Hospital course:  26 year old female with history of chlamydia was admitted last night with altered mental status, fevers chills and lower abdominal pain.  Patient had extensive work-up as she was unable to provide a history.  Work-up was notable for CSF consistent with likely viral meningitis and possible UTI.  Also noted to have UDS positive only for THC, CPK 6000 and normal lactate.  COVID and influenza were negative.  Head CT was negative.  Patient was started on broad-spectrum antibiotics including ceftriaxone, acyclovir to cover meningitis as well as vancomycin and Flagyl.   Subjective: Patient in bed, appears comfortable, denies any headache, no fever, no chest pain or pressure, no shortness of breath , no abdominal pain. No new focal weakness.   Objective: Vitals:   03/02/21 1617 03/02/21 2013 03/03/21 0133 03/03/21 0852  BP: 134/88 119/78 (!) 124/91 (!) 114/58  Pulse: 69 69 73 83  Resp: 18 19 19 16   Temp: 97.6 F (36.4 C) 98.2 F (36.8 C) 97.7 F (36.5 C) 98.3 F (36.8 C)  TempSrc: Oral Oral Oral Oral  SpO2:  97% 100% 99%  Weight:      Height:        Intake/Output Summary (Last 24 hours) at 03/03/2021 1217 Last data filed at 03/02/2021 1900 Gross per 24 hour  Intake --  Output 250 ml  Net -250 ml   Filed Weights   02/26/21 0500 02/27/21 0500 03/02/21 0500  Weight: 55.2 kg 54.5 kg 52 kg     Exam:  Awake Alert, No new F.N deficits, Normal affect [place].AT,PERRAL Supple Neck, No JVD,   Symmetrical Chest wall movement, Good air movement bilaterally, CTAB RRR,No Gallops, Rubs or new Murmurs,  +ve B.Sounds, Abd Soft, No tenderness,   No Cyanosis, Clubbing or edema    Assessment & Plan:   26 year old female was admitted with fever and altered mental status with work-up suggestive of possible viral meningitis vs primary  presentation of autoimmune disease.  Altered mental status with fever followed by hypothermia/severe sepsis -  Appreciate neurology and ID consultations--this seems to be a viral meningitis versus autoimmune meningitis/encephalitis.  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  MRI nonspecific changes.  Appreciate neurology and ID input.  Most likely finish 5-day IV steroids and discharged home on low-dose oral steroid for possible Addison's insufficiency with outpatient neurology and endocrine follow-up  R/O Addison's disease -she had hyponatremia with hyperkalemia, hypothermia, orthostatic hypotension.  Likely has adrenal insufficiency unfortunately she is on high doses of Decadron.  Plan will be to place her on low-dose steroids with outpatient endocrine follow-up  Orthostatic hypotension.  See above, add TEDs, IVF continue.  Hypothermia - resolved.  AKI in setting of mild elevation of CPK - resolved with hydration   Axillary adenopathy - follow outpatient with surgery for excision biopsy.  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  Outpatient follow-up with ID per ID.  No cough or pulmonary symptoms.   DVT prophylaxis: Lovenox Code Status: Full Family Communication: Spoke with patient's partner Delana Meyer who was at bedside on  02/26/21, 02/27/2021, bedside 02/28/2021, 03/01/21  disposition Plan:   Patient is from: Home  Anticipated Discharge Location: Home  Barriers to Discharge: Acutely ill and septic  Is patient medically stable for Discharge: No   Scheduled Meds:  enoxaparin (LOVENOX) injection  40 mg Subcutaneous Q24H   insulin aspart  0-9 Units  Subcutaneous TID WC   sodium chloride flush  3 mL Intravenous Q12H   Continuous Infusions:  methylPREDNISolone (SOLU-MEDROL) injection 1,000 mg (03/03/21 0913)    Data Reviewed:  Basic Metabolic Panel: Recent Labs  Lab 02/25/21 0031 02/26/21 0050 02/27/21 0059 02/28/21 0106 03/01/21 0615 03/02/21 0041  NA 133* 133* 131* 133* 134* 138  K 4.3 3.9 4.2 3.8 3.9 4.3  CL 103 103 102 103 102 102  CO2 24 24 23 27 28 30   GLUCOSE 158* 155* 135* 113* 109* 138*  BUN 13 11 11 10 11 12   CREATININE 0.62 0.70 0.64 0.64 0.66 0.68  CALCIUM 8.3* 8.2* 7.9* 8.2* 8.7* 8.9  MG 2.0  --  2.3 2.2 2.4 2.3    CBC: Recent Labs  Lab 02/26/21 0050 02/27/21 0059 02/28/21 0106 03/01/21 0615 03/02/21 0041  WBC 7.3 8.1 7.9 6.7 5.5  NEUTROABS 6.8 7.2 6.3 5.2 4.5  HGB 8.4* 8.2* 7.8* 9.2* 9.3*  HCT 25.5* 25.1* 23.9* 28.5* 29.3*  MCV 76.8* 76.8* 77.9* 79.2* 80.9  PLT 151 198 260 309 303    Studies: No results found.  Principal Problem:   Severe sepsis ([place]) Active Problems:   AKI (acute kidney injury) ([place])   Acute encephalopathy   Hyponatremia   Elevated transaminase level   Axillary lymphadenopathy  Signature  Lala Lund M.D on 03/03/2021 at 12:17 PM   -  To page go to [email or web address]       LOS: 9 days

## 2021-03-04 DIAGNOSIS — A419 Sepsis, unspecified organism: Secondary | ICD-10-CM | POA: Diagnosis not present

## 2021-03-04 DIAGNOSIS — R531 Weakness: Secondary | ICD-10-CM | POA: Diagnosis not present

## 2021-03-04 DIAGNOSIS — G9340 Encephalopathy, unspecified: Secondary | ICD-10-CM | POA: Diagnosis not present

## 2021-03-04 DIAGNOSIS — R4182 Altered mental status, unspecified: Secondary | ICD-10-CM | POA: Diagnosis not present

## 2021-03-04 DIAGNOSIS — R6520 Severe sepsis without septic shock: Secondary | ICD-10-CM | POA: Diagnosis not present

## 2021-03-04 LAB — GLUCOSE, CAPILLARY
Glucose-Capillary: 101 mg/dL — ABNORMAL HIGH (ref 70–99)
Glucose-Capillary: 112 mg/dL — ABNORMAL HIGH (ref 70–99)

## 2021-03-04 MED ORDER — DEXAMETHASONE 2 MG PO TABS: 2.0000 mg | ORAL_TABLET | Freq: Two times a day (BID) | ORAL | 0 refills | Status: DC

## 2021-03-04 NOTE — Discharge Instructions (Signed)
Follow with Primary MD Karleen Dolphin, MD (Inactive) within a week also follow with the recommended subspecialists in 1 to 2 weeks  Get CBC, CMP, 2 view Chest X ray -  checked next visit within 1 week by Primary MD    Activity: As tolerated with Full fall precautions use walker/cane & assistance as needed  Disposition Home   Diet: Heart Healthy    Special Instructions: If you have smoked or chewed Tobacco  in the last 2 yrs please stop smoking, stop any regular Alcohol  and or any Recreational drug use.  On your next visit with your primary care physician please Get Medicines reviewed and adjusted.  Please request your Prim.MD to go over all Hospital Tests and Procedure/Radiological results at the follow up, please get all Hospital records sent to your Prim MD by signing hospital release before you go home.  If you experience worsening of your admission symptoms, develop shortness of breath, life threatening emergency, suicidal or homicidal thoughts you must seek medical attention immediately by calling 911 or calling your MD immediately  if symptoms less severe.  You Must read complete instructions/literature along with all the possible adverse reactions/side effects for all the Medicines you take and that have been prescribed to you. Take any new Medicines after you have completely understood and accpet all the possible adverse reactions/side effects.

## 2021-03-04 NOTE — Progress Notes (Addendum)
Neurology Progress Note  Subjective: No acute overnight events  Exam: Vitals:   03/04/21 0016 03/04/21 0457  BP: 129/83 115/72  Pulse: 78 65  Resp: 20   Temp: 97.9 F (36.6 C) 98 F (36.7 C)  SpO2: 94% 95%   Gen: Laying in hospital bed, in no acute distress Resp: non-labored breathing, no respiratory distress Abd: soft, non-tender, non-distended  Neuro: Mental Status: Asleep initially, wakes easily to voice, alert, and oriented to self, age, month, place, situation, and location.  Speech is intact without dysarthria. Good attention noted.  No neglect is noted. Patient follows commands without difficulty.  Cranial Nerves: PERRL, EOMI, facial sensation is intact and symmetric to light touch, face is symmetric resting and with movement, hearing is intact to voice, tongue and palate are midline.  Motor: 5/5 strength present throughout upper and lower extremities.  Normal tone and bulk.  Sensory: Intact and symmetric to light touch throughout. Gait: Deferred  Pertinent Labs: CBC    Component Value Date/Time   WBC 5.5 03/02/2021 0041   RBC 3.62 (L) 03/02/2021 0041   HGB 9.3 (L) 03/02/2021 0041   HGB 10.2 (L) 09/06/2018 0855   HCT 29.3 (L) 03/02/2021 0041   HCT 31.6 (L) 09/06/2018 0855   PLT 303 03/02/2021 0041   PLT 347 09/06/2018 0855   MCV 80.9 03/02/2021 0041   MCV 87 09/06/2018 0855   MCH 25.7 (L) 03/02/2021 0041   MCHC 31.7 03/02/2021 0041   RDW 14.6 03/02/2021 0041   RDW 12.3 09/06/2018 0855   LYMPHSABS 0.5 (L) 03/02/2021 0041   LYMPHSABS 1.2 06/07/2018 1611   MONOABS 0.2 03/02/2021 0041   EOSABS 0.0 03/02/2021 0041   EOSABS 0.0 06/07/2018 1611   BASOSABS 0.0 03/02/2021 0041   BASOSABS 0.0 06/07/2018 1611   CMP     Component Value Date/Time   NA 138 03/02/2021 0041   K 4.3 03/02/2021 0041   CL 102 03/02/2021 0041   CO2 30 03/02/2021 0041   GLUCOSE 138 (H) 03/02/2021 0041   BUN 12 03/02/2021 0041   CREATININE 0.68 03/02/2021 0041   CREATININE 0.83  04/03/2015 1114   CALCIUM 8.9 03/02/2021 0041   PROT 6.1 (L) 03/02/2021 0041   ALBUMIN 2.9 (L) 03/02/2021 0041   AST 32 03/02/2021 0041   ALT 41 03/02/2021 0041   ALKPHOS 28 (L) 03/02/2021 0041   BILITOT 0.6 03/02/2021 0041   GFRNONAA >60 03/02/2021 0041   GFRAA >60 11/18/2018 0610    Latest Reference Range & Units 02/24/21 10:50  SEE BELOW  Comment  Anti Nuclear Antibody (ANA) Negative  Positive !  Anti JO-1 0.0 - 0.9 AI <0.2  CENTROMERE AB SCREEN 0.0 - 0.9 AI <0.2  ds DNA Ab 0 - 9 IU/mL 1  Cytoplasmic (C-ANCA) Neg:<1:20 titer <1:20  P-ANCA Neg:<1:20 titer <1:20  Atypical P-ANCA titer Neg:<1:20 titer <1:20  ENA SM Ab Ser-aCnc 0.0 - 0.9 AI <0.2  Chromatin Ab SerPl-aCnc 0.0 - 0.9 AI <0.2  !: Data is abnormal   Latest Reference Range & Units 02/24/21 10:50  Ribonucleic Protein 0.0 - 0.9 AI 0.2  ENA SM Ab Ser-aCnc 0.0 - 0.9 AI <0.2  SSA (Ro) (ENA) Antibody, IgG 0.0 - 0.9 AI 0.0 - 0.9 AI >8.0 (H) >8.0 (H)  SSB (La) (ENA) Antibody, IgG 0.0 - 0.9 AI 0.0 - 0.9 AI 0.4 0.4  Scleroderma (Scl-70) (ENA) Antibody, IgG 0.0 - 0.9 AI <0.2  (H): Data is abnormally high  Imaging Reviewed: No new neurologic imaging studies  today.   Assessment: 26 y.o. female who was admitted with confusion, fever, chills and lower abdominal pain. Extensive workup included CSF with pleocytosis (WBC 21) which has a neutrophilic predominance, elevated protein to 120 and mild hypoglycorrhachia(Glu: 30). Viral meningitis is on the differential but HSV returned negative. Sjogren's autoAb (ro) strongly positive. 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  - Serum and CSF panels for autoimmune encephalitis send outs pending: Abs to [facility] clinic which will take approx 2-3 weeks to result.  - Empiric course IV solumedrol 1 g daily x 5 days to be completed today 1/10 and ID was amenable to this.  - Scattered white matter hyperintensities on MRI brain appear chronic, nonspecific and mild in  extent, but clearly are abnormal given the patient's age. DDx for the MRI findings is broad, but certainly includes an autoimmune encephalopathy or a small vessel vasculitis. The MRI findings are not consistent with a viral meningitis. - CTA c/a/p w contrast showed no e/o malignancy. 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 Felecia Shelling of [facility] is an excellent choice)  Anibal Henderson, AGACNP-BC [facility] [phone]  Electronically signed: Dr. Kerney Elbe

## 2021-03-04 NOTE — Progress Notes (Signed)
Inpatient Rehab Admissions Coordinator:   Met with patient at bedside.  I did receive insurance authorization and offered a bed for CIR today.  Pt feels she is doing very well and would prefer to d/c home.  I walked with her around the loop (at least 150'), and she was able to ambulate with IV pole and close supervision.  We discussed use of RW for safety at home, and I discussed follow up with home health, for which she is now agreeable.  Would defer any DME or follow up recommendations to her treating therapy team, however.  She will have assist from her partner and her family at discharge.  I let Dr. Candiss Norse and [facility] team know.    Shann Medal, PT, DPT Admissions Coordinator [phone] 03/04/21  11:16 AM

## 2021-03-04 NOTE — Discharge Summary (Signed)
Erin French WGY:[id number] DOB: Mar 30, 1995 DOA: 02/22/2021  PCP: Karleen Dolphin, MD (Inactive)  Admit date: 02/22/2021  Discharge date: 03/04/2021  Admitted From: Home   Disposition: Home - she refused CIR or SNF   Recommendations for Outpatient Follow-up:   Follow up with PCP in 1-2 weeks  PCP Please obtain BMP/CBC, 2 view CXR in 1week,  (see Discharge instructions)   PCP Please follow up on the following pending results: Review CT and MRI report closely.  Needs excision biopsy of her axillary lymphadenopathy and outpatient mammogram by PCP.  Also needs to follow with neurology, ID, endocrine closely in the outpatient setting.   Home Health: Does not want home health PT or RN Equipment/Devices: As below Consultations: Neurology, ID Discharge Condition: Stable  CODE STATUS: Full    Diet Recommendation: Heart Healthy   Diet Order             Diet - low sodium heart healthy           Diet regular Room service appropriate? Yes; Fluid consistency: Thin  Diet effective now                    Chief Complaint  Patient presents with   Altered Mental Status   Fever   Code Sepsis     Brief history of present illness from the day of admission and additional interim summary    26 year old female with history of chlamydia was admitted last night with altered mental status, fevers chills and lower abdominal pain.  Patient had extensive work-up as she was unable to provide a history.  Work-up was notable for CSF consistent with likely viral meningitis and possible UTI.  Also noted to have UDS positive only for THC, CPK 6000 and normal lactate.  COVID and influenza were negative.  Head CT was negative.  Patient was started on broad-spectrum antibiotics including ceftriaxone, acyclovir to cover meningitis as well  as vancomycin and Flagyl.                                                                 Hospital Course    Altered mental status with fever followed by hypothermia/severe sepsis -  Appreciate neurology and ID consultations--this seems to be a viral meningitis versus autoimmune meningitis/encephalitis.  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  MRI nonspecific changes.  Appreciate neurology and ID input.  Most likely finish 5-day IV steroids and discharged home on low-dose oral steroid for possible Addison's insufficiency with outpatient neurology and endocrine follow-up.  Currently close to her baseline has refused placement to CIR SNF.  Does not want home PT or RN.  Girlfriend bedside agrees with the plan of going home.   R/O Addison's disease -she had hyponatremia with hyperkalemia, hypothermia, orthostatic hypotension.  Likely has adrenal insufficiency unfortunately she is on high doses Solu-Medrol here and hence cosyntropin stimulation test could not be done here.  Plan will be to place her on low-dose steroids with outpatient endocrine follow-up week of discharge.  PCP to arrange   Orthostatic hypotension.  See above, add TEDs, IVF continue.   Hypothermia - resolved.   AKI in setting of mild elevation of CPK - resolved with hydration   Axillary adenopathy - follow outpatient with surgery for excision biopsy.  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  Outpatient follow-up with ID per ID.  No cough or pulmonary symptoms.   Discharge diagnosis     Principal Problem:   Severe sepsis  ([place]) Active Problems:   AKI (acute kidney injury) ([place])   Acute encephalopathy   Hyponatremia   Elevated transaminase level   Axillary lymphadenopathy    Discharge instructions    Discharge Instructions     Diet - low sodium heart healthy   Complete by: As directed    Discharge instructions   Complete by: As directed    Follow with Primary MD Karleen Dolphin, MD (Inactive) within a week also follow with the recommended subspecialists in 1 to 2 weeks  Get CBC, CMP, 2 view Chest X ray -  checked next visit within 1 week by Primary MD    Activity: As tolerated with Full fall precautions use walker/cane & assistance as needed  Disposition Home   Diet: Heart Healthy    Special Instructions: If you have smoked or chewed Tobacco  in the last 2 yrs please stop smoking, stop any regular Alcohol  and or any Recreational drug use.  On your next visit with your primary care physician please Get Medicines reviewed and adjusted.  Please request your Prim.MD to go over all Hospital Tests and Procedure/Radiological results at the follow up, please get all Hospital records sent to your Prim MD by signing hospital release before you go home.  If you experience worsening of your admission symptoms, develop shortness of breath, life threatening emergency, suicidal or homicidal thoughts you must seek medical attention immediately by calling 911 or calling your MD immediately  if symptoms less severe.  You Must read complete instructions/literature along with all the possible adverse reactions/side effects for all the Medicines you take and that have been prescribed to you. Take any new Medicines after you have completely understood and accpet all the possible adverse reactions/side effects.   Increase activity slowly   Complete by: As directed        Discharge Medications   Allergies as of 03/04/2021   No Known Allergies      Medication List     STOP taking these medications     metroNIDAZOLE 500 MG tablet Commonly known as: FLAGYL       TAKE these medications    dexamethasone 2 MG tablet Commonly known as: DECADRON Take 1 tablet (2 mg total) by mouth 2 (two) times daily with a meal. Please do not stop this medication until you have been asked to do so by a family physician   ferrous sulfate 325 (65 FE) MG  tablet Take 1 tablet (325 mg total) by mouth 2 (two) times daily with a meal.               Durable Medical Equipment  (From admission, onward)           Start     Ordered   03/04/21 0621  For home use only DME Walker  Once       Question:  Patient needs a walker to treat with the following condition  Answer:  Weakness   03/04/21 0620             Follow-up Information     Karleen Dolphin, MD. Schedule an appointment as soon as possible for a visit in 1 week(s).   Specialty: Pediatrics Contact information: [place] [address] [phone]         [facility]. Schedule an appointment as soon as possible for a visit in 1 week(s).   Specialty: Neurology Why: Autoimmune encephalitis Contact information: [address] [place] [phone]        Lahoma Rocker, MD. Schedule an appointment as soon as possible for a visit in 1 week(s).   Specialty: Rheumatology Why: Possible Sjogren's Contact information: [address] [place] [address] [phone]         Renato Shin, MD. Schedule an appointment as soon as possible for a visit in 1 week(s).   Specialty: Endocrinology Why: Possible adrenal insufficiency Contact information: [address] [place] [address] [phone]         Laurice Record, MD Follow up in 2 week(s).   Specialty: Infectious Diseases Why: Indeterminant quanteferon test Contact information: [address], [place] [address] [phone]                  Major procedures and Radiology Reports - PLEASE review detailed and final reports thoroughly  -        CT Head Wo Contrast  Result Date: 02/22/2021 CLINICAL DATA:  Mental status change, unknown cause. Seen here last week had viral infection and prescribed antibiotics. Unsure if she took it. Family friend came to check on her and found her AMS and slurring words. Fever tachy and tachypneic. EXAM: CT HEAD WITHOUT CONTRAST TECHNIQUE: Contiguous axial images were obtained from the base of the skull through the vertex without intravenous contrast. COMPARISON:  None. FINDINGS: Brain: No evidence of large-territorial acute infarction. No parenchymal hemorrhage. No mass lesion. No extra-axial collection. No mass effect or midline shift. No hydrocephalus. Basilar cisterns are patent. Vascular: No hyperdense vessel. Skull: No acute fracture or focal lesion. Sinuses/Orbits: Paranasal sinuses and mastoid air cells are clear. The orbits are unremarkable. Other: None. IMPRESSION: No acute intracranial abnormality. Electronically Signed   By: Iven Finn M.D.   On: 02/22/2021 17:43   MR BRAIN W WO CONTRAST  Result Date: 02/27/2021 CLINICAL DATA:  Suspect viral meningitis versus autoimmune encephalitis. Fever and altered mental status. EXAM: MRI HEAD WITHOUT AND WITH CONTRAST TECHNIQUE: Multiplanar, multiecho pulse sequences of the brain and surrounding structures were obtained without and with intravenous contrast. CONTRAST:  27mL GADAVIST GADOBUTROL 1 MMOL/ML IV SOLN COMPARISON:  CT head 02/22/2021 FINDINGS: Brain: Ventricle size and cerebral volume normal. Negative for acute infarct. Negative for hemorrhage or mass. No evidence of herpes encephalitis. Scattered small deep white matter hyperintensities bilaterally. Small right frontal periventricular white matter hyperintensity. Brainstem and cerebellum normal. No fluid collection.  Normal enhancement. Postcontrast imaging degraded by motion. Allowing  for this, no abnormal leptomeningeal enhancement identified. Vascular: Normal arterial flow voids. Skull and upper cervical spine: Negative Sinuses/Orbits: Mild mucosal edema paranasal sinuses. Mild mastoid mucosal edema bilaterally. Negative orbit Other: None IMPRESSION: Multiple small white matter hyperintensities, abnormal for 26 year old. Differential diagnosis includes demyelinating disease, vasculitis, chronic ischemia, and other chronic white matter inflammatory disorders. None of these enhance are exhibits mass-effect. No areas of restricted diffusion No enhancing lesion.  No intracranial fluid collection. Electronically Signed   By: Franchot Gallo M.D.   On: 02/27/2021 11:52   CT CHEST ABDOMEN PELVIS W CONTRAST  Result Date: 02/22/2021 CLINICAL DATA:  Respiratory illness, nondiagnostic xray. Seen here last week had viral infection and prescribed antibiotics. Unsure if she took it. Family friend came to check on her and found her AMS and slurring words. Fever tachy and tachypneic. EXAM: CT CHEST, ABDOMEN, AND PELVIS WITH CONTRAST TECHNIQUE: Multidetector CT imaging of the chest, abdomen and pelvis was performed following the standard protocol during bolus administration of intravenous contrast. CONTRAST:  137mL OMNIPAQUE IOHEXOL 300 MG/ML  SOLN COMPARISON:  None. FINDINGS: CT CHEST FINDINGS Cardiovascular: Normal heart size. No significant pericardial effusion. The thoracic aorta is normal in caliber. No atherosclerotic plaque of the thoracic aorta. No coronary artery calcifications. Mediastinum/Nodes: No enlarged mediastinal or hilar lymph nodes. Borderline enlarged right axillary lymph nodes. No definite left lymphadenopathy. Thyroid gland, trachea, and esophagus demonstrate no significant findings. Lungs/Pleura: No focal consolidation. No pulmonary nodule. No pulmonary mass. No pleural effusion. No pneumothorax. Musculoskeletal: No chest wall abnormality. No suspicious lytic or blastic osseous  lesions. No acute displaced fracture. CT ABDOMEN PELVIS FINDINGS Hepatobiliary: No focal liver abnormality. No gallstones, gallbladder wall thickening, or pericholecystic fluid. No biliary dilatation. Pancreas: No focal lesion. Normal pancreatic contour. No surrounding inflammatory changes. No main pancreatic ductal dilatation. Spleen: Normal in size without focal abnormality. A splenule is noted. Adrenals/Urinary Tract: No adrenal nodule bilaterally. Bilateral kidneys enhance symmetrically. Mild bilateral hydroureteronephrosis. Punctate left nephrolithiasis. No right nephrolithiasis. No ureterolithiasis bilaterally. The urinary bladder is distended with urine and otherwise unremarkable. On delayed imaging, there is no urothelial wall thickening and there are no filling defects in the opacified portions of the bilateral collecting systems or ureters. Stomach/Bowel: Stomach is within normal limits. No evidence of bowel wall thickening or dilatation. Medialized mobile cecum. Appendix appears normal. Vascular/Lymphatic: No abdominal aorta or iliac aneurysm. No abdominal, pelvic, or inguinal lymphadenopathy. Reproductive: Calcified anterior uterine wall fibroid. Otherwise uterus and bilateral adnexa are unremarkable. Other: No intraperitoneal free fluid. No intraperitoneal free gas. No organized fluid collection. Musculoskeletal: No abdominal wall hernia or abnormality. No suspicious lytic or blastic osseous lesions. No acute displaced fracture. IMPRESSION: 1. Borderline enlarged right axillary lymph nodes. Correlate with diagnostic mammography. 2. Mild bilateral hydroureteronephrosis. Associated urinary bladder distention with urine. This may reflect the residual of a recently passed calculus or reflect chronic changes of obstructive uropathy or reflux. 3. Nonobstructive punctate left nephrolithiasis. 4. Likely degenerative uterine fibroid. Electronically Signed   By: Iven Finn M.D.   On: 02/22/2021 18:11   DG  Chest Port 1 View  Result Date: 02/22/2021 CLINICAL DATA:  Sepsis EXAM: PORTABLE CHEST 1 VIEW COMPARISON:  None. FINDINGS: The heart size and mediastinal contours are within normal limits. Both lungs are clear. There is curvature of spine. IMPRESSION: No active disease. Electronically Signed   By: Abelardo Diesel M.D.   On: 02/22/2021 15:26   EEG adult  Result Date: 02/23/2021  Lora Havens, MD     02/23/2021  4:59 PM Patient Name: Erin French MRN: [id number] Epilepsy Attending: Lora Havens Referring Physician/Provider: Donita Brooks, NP Date: 02/23/2021 Duration: 22.13 mins Patient history: 25yo F with ams. EEG to evaluate for seizure Level of alertness:lethargic AEDs during EEG study: None Technical aspects: This EEG study was done with scalp electrodes positioned according to the 10-20 International system of electrode placement. Electrical activity was acquired at a sampling rate of 500Hz  and reviewed with a high frequency filter of 70Hz  and a low frequency filter of 1Hz . EEG data were recorded continuously and digitally stored. Description: EEG showed continuous generalized 3 to 6 Hz theta-delta slowing admixed with frontocentral 112-14hz  beta activity Hyperventilation and photic stimulation were not performed.   Of note, study was technically difficult due to significant sweat artifact. 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 No seizures or epileptiform discharges were seen throughout the recording. Lora Havens   EEG adult  Result Date: 02/23/2021 Lora Havens, MD     02/23/2021  7:26 AM Patient Name: Erin French MRN: [id number] Epilepsy Attending: Lora Havens Referring Physician/Provider: Dr Mitzi Hansen Date: 02/23/2021 Duration: 32.38 mins Patient history: 25yo F with ams. EEG to evaluate for seizure Level of alertness: Awake AEDs during EEG study:  None Technical aspects: This EEG study was done with scalp electrodes positioned according to the 10-20 International system of electrode placement. Electrical activity was acquired at a sampling rate of 500Hz  and reviewed with a high frequency filter of 70Hz  and a low frequency filter of 1Hz . EEG data were recorded continuously and digitally stored. Description: EEG showed continuous generalized 3 to 6 Hz theta-delta slowing. Hyperventilation and photic stimulation were not performed.   Of note, study was technically difficult due to significant myogenic artifact. ABNORMALITY - Continuous slow, generalized IMPRESSION: This technically difficult study is suggestive of moderate diffuse encephalopathy, nonspecific etiology. No seizures or epileptiform discharges were seen throughout the recording. Lora Havens   ECHOCARDIOGRAM COMPLETE  Result Date: 02/23/2021    ECHOCARDIOGRAM REPORT   Patient Name:   Erin French Date of Exam: 02/23/2021 Medical Rec #:  [id number]      Height:       64.5 in Accession #:    [id number]     Weight:       134.0 lb Date of Birth:  1995/10/01      BSA:          1.659 m Patient Age:    25 years       BP:           124/93 mmHg Patient Gender: F              HR:           80 bpm. Exam Location:  Inpatient Procedure: 2D Echo, 3D Echo, Color Doppler and Cardiac Doppler STAT ECHO Indications:    Pericardial effusion I31.3  History:        Patient has no prior history of Echocardiogram examinations.                 Fever, chills, aches, lower abdominal pain. Severe sepsis, acute                 encephalopathy, acute kidney injury.  Sonographer:    Darlina Sicilian RDCS Referring Phys: [id number] [place]  1. Left ventricular ejection  fraction, by estimation, is 55 to 60%. The left ventricle has normal function. The left ventricle has no regional wall motion abnormalities. There is moderate left ventricular hypertrophy of the lateral segment. Left ventricular diastolic  parameters are consistent with Grade I diastolic dysfunction (impaired relaxation).  2. Right ventricular systolic function is normal. The right ventricular size is normal. There is normal pulmonary artery systolic pressure.  3. The pericardial effusion is circumferential. There is no evidence of cardiac tamponade.  4. The mitral valve is normal in structure. Trivial mitral valve regurgitation. No evidence of mitral stenosis.  5. The aortic valve is tricuspid. Aortic valve regurgitation is not visualized. No aortic stenosis is present.  6. The inferior vena cava is normal in size with greater than 50% respiratory variability, suggesting right atrial pressure of 3 mmHg. FINDINGS  Left Ventricle: Left ventricular ejection fraction, by estimation, is 55 to 60%. The left ventricle has normal function. The left ventricle has no regional wall motion abnormalities. The left ventricular internal cavity size was normal in size. There is  moderate left ventricular hypertrophy of the lateral segment. Left ventricular diastolic parameters are consistent with Grade I diastolic dysfunction (impaired relaxation). Indeterminate filling pressures. Right Ventricle: The right ventricular size is normal. No increase in right ventricular wall thickness. Right ventricular systolic function is normal. There is normal pulmonary artery systolic pressure. The tricuspid regurgitant velocity is 1.99 m/s, and  with an assumed right atrial pressure of 3 mmHg, the estimated right ventricular systolic pressure is 18.5 mmHg. Left Atrium: Left atrial size was normal in size. Right Atrium: Right atrial size was normal in size. Pericardium: Trivial pericardial effusion is present. The pericardial effusion is circumferential. There is no evidence of cardiac tamponade. Mitral Valve: The mitral valve is normal in structure. Trivial mitral valve regurgitation. No evidence of mitral valve stenosis. Tricuspid Valve: The tricuspid valve is normal in  structure. Tricuspid valve regurgitation is trivial. No evidence of tricuspid stenosis. Aortic Valve: The aortic valve is tricuspid. Aortic valve regurgitation is not visualized. No aortic stenosis is present. Pulmonic Valve: The pulmonic valve was normal in structure. Pulmonic valve regurgitation is trivial. No evidence of pulmonic stenosis. Aorta: The aortic root is normal in size and structure. Venous: The inferior vena cava is normal in size with greater than 50% respiratory variability, suggesting right atrial pressure of 3 mmHg. IAS/Shunts: No atrial level shunt detected by color flow Doppler.  LEFT VENTRICLE PLAX 2D LVIDd:         4.20 cm   Diastology LVIDs:         3.00 cm   LV e' medial:    8.27 cm/s LV PW:         1.20 cm   LV E/e' medial:  9.4 LV IVS:        1.20 cm   LV e' lateral:   7.40 cm/s LVOT diam:     2.20 cm   LV E/e' lateral: 10.6 LV SV:         56 LV SV Index:   34 LVOT Area:     3.80 cm  RIGHT VENTRICLE RV S prime:     11.70 cm/s TAPSE (M-mode): 2.1 cm LEFT ATRIUM             Index        RIGHT ATRIUM          Index LA diam:        2.45 cm 1.48 cm/m   RA Area:  7.56 cm LA Vol (A2C):   32.2 ml 19.41 ml/m  RA Volume:   11.20 ml 6.75 ml/m LA Vol (A4C):   27.6 ml 16.64 ml/m LA Biplane Vol: 30.7 ml 18.51 ml/m  AORTIC VALVE LVOT Vmax:   73.40 cm/s LVOT Vmean:  45.800 cm/s LVOT VTI:    0.147 m  AORTA Ao Root diam: 2.90 cm Ao Asc diam:  3.00 cm MITRAL VALVE               TRICUSPID VALVE MV Area (PHT): 5.75 cm    TR Peak grad:   15.8 mmHg MV Decel Time: 132 msec    TR Vmax:        199.00 cm/s MV E velocity: 78.10 cm/s MV A velocity: 82.80 cm/s  SHUNTS MV E/A ratio:  0.94        Systemic VTI:  0.15 m                            Systemic Diam: 2.20 cm Skeet Latch MD Electronically signed by Skeet Latch MD Signature Date/Time: 02/23/2021/4:05:03 PM    Final       Today   Subjective    Erin French today has no headache,no chest abdominal pain,no new weakness tingling or  numbness, feels much better wants to go home today - refused CIR/SNF   Objective   Blood pressure 135/84, pulse 63, temperature 97.7 F (36.5 C), temperature source Oral, resp. rate 17, height 5' 4.5" (1.638 m), weight 52 kg, SpO2 98 %, currently breastfeeding.   Intake/Output Summary (Last 24 hours) at 03/04/2021 1006 Last data filed at 03/03/2021 2201 Gross per 24 hour  Intake 595.8 ml  Output --  Net 595.8 ml    Exam  Awake Alert, No new F.N deficits,    [place].AT,PERRAL Supple Neck,   Symmetrical Chest wall movement, Good air movement bilaterally, CTAB RRR,No Gallops,   +ve B.Sounds, Abd Soft, Non tender,  No Cyanosis, Clubbing or edema    Data Review   CBC w Diff:  Lab Results  Component Value Date   WBC 5.5 03/02/2021   HGB 9.3 (L) 03/02/2021   HGB 10.2 (L) 09/06/2018   HCT 29.3 (L) 03/02/2021   HCT 31.6 (L) 09/06/2018   PLT 303 03/02/2021   PLT 347 09/06/2018   LYMPHOPCT 10 03/02/2021   MONOPCT 4 03/02/2021   EOSPCT 0 03/02/2021   BASOPCT 0 03/02/2021    CMP:  Lab Results  Component Value Date   NA 138 03/02/2021   K 4.3 03/02/2021   CL 102 03/02/2021   CO2 30 03/02/2021   BUN 12 03/02/2021   CREATININE 0.68 03/02/2021   CREATININE 0.83 04/03/2015   PROT 6.1 (L) 03/02/2021   ALBUMIN 2.9 (L) 03/02/2021   BILITOT 0.6 03/02/2021   ALKPHOS 28 (L) 03/02/2021   AST 32 03/02/2021   ALT 41 03/02/2021  .   Total Time in preparing paper work, data evaluation and todays exam - 48 minutes  Lala Lund M.D on 03/04/2021 at 10:06 AM  [facility]

## 2021-03-04 NOTE — TOC Transition Note (Signed)
Transition of Care [facility]) - CM/SW Discharge Note   Patient Details  Name: Erin French MRN: [id number] Date of Birth: 15-Dec-1995  [facility] CM/SW Contact:  Carles Collet, RN Phone Number: 03/04/2021, 11:31 AM   Clinical Narrative:    Damaris Schooner w patient at bedside. She confirms needing RW, ordered and it will be delivered to the room prior to DC through [employer]. Patient agreeable to outpatient therapy services, previously refused HH.  Referral made to [facility] street and patient aware they will follow up with her to schedule.           Patient Goals and CMS Choice        Discharge Placement                       Discharge Plan and Services                                     Social Determinants of Health (SDOH) Interventions     Readmission Risk Interventions No flowsheet data found.

## 2021-03-04 NOTE — Progress Notes (Signed)
Occupational Therapy Treatment Patient Details Name: Erin French MRN: [id number] DOB: 05-27-1995 Today's Date: 03/04/2021   History of present illness 26 y.o. female presenting to the emergency department 02/22/21 with altered mental status. EEG moderate diffuse encephalopathy; possible UTI; viral meningitis suspected per Neuro and ID consults, but later ruled out; ?autoimmune encephalitis; 1/05 MRI brain multiple small T2/FLAIR hyperintensities in bilateral white matter abnl for age   PMH significant for chlamydia   OT comments  OT treatment session with focus on self-care re-education including ADL transfers and safety with RW. Patient able to return demo tub/shower transfer with supervision A and cues for technique. Patient declined both acute inpatient rehab and [facility] but rolling walker present at bedside. Verbal education provided on safety with ADLs/ADL transfers upon return home. Patient reports feeling much better and believes that she can return home with assist from significant other. Plan for d/c later today.    Recommendations for follow up therapy are one component of a multi-disciplinary discharge planning process, led by the attending physician.  Recommendations may be updated based on patient status, additional functional criteria and insurance authorization.    Follow Up Recommendations  Outpatient OT    Assistance Recommended at Discharge Intermittent Supervision/Assistance  Patient can return home with the following  A little help with bathing/dressing/bathroom;Assistance with cooking/housework   Equipment Recommendations  Other (comment) (RW; patient declined [facility]SC)    Recommendations for Other Services      Precautions / Restrictions Precautions Precautions: Fall Precaution Comments: pt reports some falls at home PTA       Mobility Bed Mobility Overal bed mobility: Independent                  Transfers Overall transfer level: Needs  assistance Equipment used: Rolling walker (2 wheels);None Transfers: Sit to/from Stand Sit to Stand: Supervision           General transfer comment: No external assist required. Cues for hand placement and safety with RW.     Balance Overall balance assessment: Needs assistance Sitting-balance support: No upper extremity supported;Feet supported Sitting balance-Leahy Scale: Good     Standing balance support: Bilateral upper extremity supported;Reliant on assistive device for balance Standing balance-Leahy Scale: Fair Standing balance comment: Improved balance this date demonstrating ability to maintain static standing balance without UE support.                           ADL either performed or assessed with clinical judgement   ADL Overall ADL's : Needs assistance/impaired                                 Tub/ Shower Transfer: Supervision/safety;Grab bars Tub/Shower Transfer Details (indicate cue type and reason): Simulated in hospital bathroom. Cues for safety and technique.        Extremity/Trunk Assessment              Vision       Perception     Praxis      Cognition Arousal/Alertness: Awake/alert Behavior During Therapy: Flat affect Overall Cognitive Status: Within Functional Limits for tasks assessed                                 General Comments: Improved cognition compared to initial evaluation.  Exercises     Shoulder Instructions       General Comments VSS on RA.    Pertinent Vitals/ Pain       Pain Assessment: No/denies pain  Home Living                                          Prior Functioning/Environment              Frequency  Min 2X/week        Progress Toward Goals  OT Goals(current goals can now be found in the care plan section)  Progress towards OT goals: Progressing toward goals  Acute Rehab OT Goals Patient Stated Goal: To return home. OT  Goal Formulation: With patient Time For Goal Achievement: 03/13/21 Potential to Achieve Goals: Good ADL Goals Pt Will Perform Grooming: standing;with modified independence Pt Will Perform Upper Body Dressing: with modified independence Pt Will Perform Lower Body Dressing: sit to/from stand;with modified independence Pt Will Transfer to Toilet: ambulating;with modified independence Pt Will Perform Toileting - Clothing Manipulation and hygiene: sit to/from stand;with modified independence Pt Will Perform Tub/Shower Transfer: Tub transfer;with modified independence;ambulating;rolling walker Additional ADL Goal #1: Patient will maintain dynamic standing balance with Mod I and LRAD in prep for ADLs/IADLs.  Plan Discharge plan needs to be updated;Frequency remains appropriate    Co-evaluation                 AM-PAC OT "6 Clicks" Daily Activity     Outcome Measure   Help from another person eating meals?: None Help from another person taking care of personal grooming?: A Little Help from another person toileting, which includes using toliet, bedpan, or urinal?: A Little Help from another person bathing (including washing, rinsing, drying)?: A Little Help from another person to put on and taking off regular upper body clothing?: A Little Help from another person to put on and taking off regular lower body clothing?: A Little 6 Click Score: 19    End of Session Equipment Utilized During Treatment: Gait belt;Rolling walker (2 wheels)  OT Visit Diagnosis: Other abnormalities of gait and mobility (R26.89);Unsteadiness on feet (R26.81);Muscle weakness (generalized) (M62.81);History of falling (Z91.81);Ataxia, unspecified (R27.0);Other symptoms and signs involving cognitive function   Activity Tolerance Patient tolerated treatment well   Patient Left in bed;with call bell/phone within reach;with bed alarm set   Nurse Communication Mobility status        Time: 1251-1300 OT Time  Calculation (min): 9 min  Charges: OT General Charges $OT Visit: 1 Visit OT Treatments $Therapeutic Activity: 8-22 mins  Kathrene Sinopoli H. OTR/L Supplemental OT, Department of rehab services [phone]  Monterius Rolf R H. 03/04/2021, 1:09 PM

## 2021-03-05 ENCOUNTER — Telehealth: Payer: Self-pay

## 2021-03-05 NOTE — Telephone Encounter (Signed)
Transition Care Management Unsuccessful Follow-up Telephone Call  Date of discharge and from where:  03/04/2021-[place]   Attempts:  1st Attempt  Reason for unsuccessful TCM follow-up call:  Unable to reach patient

## 2021-03-06 NOTE — Telephone Encounter (Signed)
Transition Care Management Unsuccessful Follow-up Telephone Call  Date of discharge and from where:  03/04/2021 from [facility]  Attempts:  2nd Attempt  Reason for unsuccessful TCM follow-up call:  Unable to leave message

## 2021-03-07 NOTE — Telephone Encounter (Signed)
Transition Care Management Unsuccessful Follow-up Telephone Call  Date of discharge and from where:  03/04/2021-[place]   Attempts:  3rd Attempt  Reason for unsuccessful TCM follow-up call:  Unable to reach patient

## 2021-03-11 LAB — MISC LABCORP TEST (SEND OUT): Labcorp test code: 9985

## 2021-03-15 NOTE — Therapy (Incomplete)
OUTPATIENT PHYSICAL THERAPY LOWER EXTREMITY EVALUATION   Patient Name: Erin French MRN: [id number] DOB:06-20-1995, 26 y.o., female Today's Date: 03/15/2021    Past Medical History:  Diagnosis Date   Chlamydia 4/12   Past Surgical History:  Procedure Laterality Date   CESAREAN SECTION N/A 11/17/2018   Procedure: CESAREAN SECTION;  Surgeon: Aletha Halim, MD;  Location: [facility] LD ORS;  Service: Obstetrics;  Laterality: N/A;   INDUCED ABORTION     Patient Active Problem List   Diagnosis Date Noted   Severe sepsis ([place]) 02/22/2021   AKI (acute kidney injury) ([place]) 02/22/2021   Acute encephalopathy 02/22/2021   Hyponatremia 02/22/2021   Elevated transaminase level 02/22/2021   Axillary lymphadenopathy 02/22/2021   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  ONSET DATE: 02/22/21  SUBJECTIVE:   SUBJECTIVE STATEMENT: ***  PERTINENT HISTORY: Pt was  hospitalized 02/22/2021  to 03/04/2021. She refused CIR or SNF HH. 26 year old female with history of chlamydia was admitted last night with altered mental status, fevers chills and lower abdominal pain.  Patient had extensive work-up as she was unable to provide a history.  Work-up was notable for CSF consistent with likely viral meningitis and possible UTI.  Also noted to have UDS positive only for THC, CPK 6000 and normal lactate.  COVID and influenza were negative.  Head CT was negative.  Patient was started on broad-spectrum antibiotics including ceftriaxone, acyclovir to cover meningitis as well as vancomycin and Flagyl. 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  She was discharged home on low-dose oral steroid for possible Addison's insufficiency with outpatient neurology and endocrine follow-up.  Partner bedside agrees with the plan of going home. orthostatic hypotension.    PAIN:  Are you having pain? {yes/no:20286} NPRS scale: ***/10 Pain location: *** Pain orientation: {Pain Orientation:25161}  PAIN TYPE: {type:313116} Pain description: {PAIN DESCRIPTION:21022940}  Aggravating factors: *** Relieving factors: ***  PRECAUTIONS: As tolerated with Full fall precautions use walker/cane & assistance as needed. Increase activity slowly    WEIGHT BEARING RESTRICTIONS No  FALLS:  Has patient fallen in last 6 months? {yes/no:20286}, Number of falls: ***  LIVING ENVIRONMENT: Lives with: {[facility] lives with:25569::"lives with their family"} Lives in: {Lives in:25570} Stairs: {yes/no:20286}; {Stairs:24000} Has following equipment at home: {Assistive devices:23999}  OCCUPATION: ***  PLOF: {PLOF:24004}  PATIENT GOALS ***   OBJECTIVE:   DIAGNOSTIC FINDINGS: MRI 02/26/21: Multiple small white matter hyperintensities, abnormal for 26 year old. Differential diagnosis includes demyelinating disease,vasculitis, chronic ischemia, and other chronic white matter inflammatory disorders. None of these enhance are exhibitsmass-effect. No areas of restricted diffusion   No enhancing lesion.  No intracranial fluid collection.  COGNITION:  Overall cognitive status: {cognition:24006}     SENSATION:  Light touch: {intact/deficits:24005}  Stereognosis: {intact/deficits:24005}  Hot/Cold: {intact/deficits:24005}  Proprioception: {intact/deficits:24005}   POSTURE:  ***  PALPATION: ***  LE AROM/PROM:  A/PROM Right 03/15/2021 Left 03/15/2021  Hip flexion     Hip extension    Hip abduction  Hip adduction    Hip internal rotation    Hip external rotation    Knee flexion    Knee extension    Ankle dorsiflexion    Ankle plantarflexion    Ankle inversion    Ankle eversion     (Blank rows = not tested)  LE MMT:  MMT Right 03/15/2021 Left 03/15/2021  Hip flexion    Hip extension    Hip abduction    Hip adduction    Hip internal rotation    Hip external rotation    Knee flexion    Knee extension    Ankle dorsiflexion    Ankle plantarflexion    Ankle inversion    Ankle eversion     (Blank rows = not tested)  LOWER EXTREMITY SPECIAL TESTS:  {LEspecialtests:26242}  FUNCTIONAL TESTS:  {Functional tests:24029}  GAIT: Distance walked: *** Assistive device utilized: {Assistive devices:23999} Level of assistance: {Levels of assistance:24026} Comments: Had hospital order for walker due to bil LE weakness      TODAY'S TREATMENT: ***   PATIENT EDUCATION:  Education details: *** Person educated: {Person educated:25204} Education method: {Education Method:25205} Education comprehension: {Education Comprehension:25206}   HOME EXERCISE PROGRAM: ***  ASSESSMENT:  CLINICAL IMPRESSION: Patient is a 26 y.o. female who was seen today for physical therapy evaluation and treatment for weakness and falls risk. Objective impairments include {opptimpairments:25111}. These impairments are limiting patient from {activity limitations:25113}. Personal factors including {Personal factors:25162} are also affecting patient's functional outcome. Patient will benefit from skilled PT to address above impairments and improve overall function.  REHAB POTENTIAL: {rehabpotential:25112}  CLINICAL DECISION MAKING: {clinical decision making:25114}  EVALUATION COMPLEXITY: {Evaluation complexity:25115}   GOALS: Goals reviewed with patient? Yes  SHORT TERM GOALS:  STG Name Target Date Goal status  1 *** Baseline:  {follow up:25551}  {GOALSTATUS:25110}  2 *** Baseline:  {follow up:25551} {GOALSTATUS:25110}  3 *** Baseline: {follow up:25551} {GOALSTATUS:25110}  4 *** Baseline: {follow up:25551} {GOALSTATUS:25110}  5 *** Baseline: {follow up:25551} {GOALSTATUS:25110}  6 *** Baseline: {follow up:25551} {GOALSTATUS:25110}  7 *** Baseline: {follow up:25551} {GOALSTATUS:25110}   LONG TERM GOALS:   LTG Name Target Date Goal status  1 *** Baseline: {follow up:25551} {GOALSTATUS:25110}  2 *** Baseline: {follow up:25551} {GOALSTATUS:25110}  3 *** Baseline: {follow up:25551} {GOALSTATUS:25110}  4 *** Baseline: {follow up:25551} {GOALSTATUS:25110}  5 *** Baseline: {follow up:25551} {GOALSTATUS:25110}  6 *** Baseline: {follow up:25551} {GOALSTATUS:25110}  7 *** Baseline: {follow up:25551} {GOALSTATUS:25110}   PLAN: PT FREQUENCY: {rehab frequency:25116}  PT DURATION: {rehab duration:25117}  PLANNED INTERVENTIONS: Therapeutic exercises, Therapeutic activity, Neuro Muscular re-education, Balance training, Gait training, Patient/Family education, Joint mobilization, Stair training, DME instructions, Cryotherapy, and Moist heat  PLAN FOR NEXT SESSION: ***   America Brown 03/15/2021, 11:33 AM

## 2021-03-18 ENCOUNTER — Ambulatory Visit: Payer: Medicaid Other | Attending: Internal Medicine | Admitting: Rehabilitative and Restorative Service Providers"

## 2021-04-03 DIAGNOSIS — F321 Major depressive disorder, single episode, moderate: Secondary | ICD-10-CM | POA: Diagnosis not present

## 2021-04-04 DIAGNOSIS — F321 Major depressive disorder, single episode, moderate: Secondary | ICD-10-CM | POA: Diagnosis not present

## 2021-04-14 DIAGNOSIS — F321 Major depressive disorder, single episode, moderate: Secondary | ICD-10-CM | POA: Diagnosis not present

## 2021-04-16 ENCOUNTER — Other Ambulatory Visit: Payer: Self-pay

## 2021-04-16 ENCOUNTER — Ambulatory Visit (INDEPENDENT_AMBULATORY_CARE_PROVIDER_SITE_OTHER): Payer: Medicaid Other

## 2021-04-16 ENCOUNTER — Other Ambulatory Visit (HOSPITAL_COMMUNITY)
Admission: RE | Admit: 2021-04-16 | Discharge: 2021-04-16 | Disposition: A | Discharge status: Home / Self Care | Payer: Medicaid Other | Source: Ambulatory Visit | Attending: Obstetrics | Admitting: Obstetrics

## 2021-04-16 VITALS — BP 127/83 | HR 73 | Ht 65.0 in | Wt 117.0 lb

## 2021-04-16 DIAGNOSIS — N898 Other specified noninflammatory disorders of vagina: Secondary | ICD-10-CM

## 2021-04-16 NOTE — Progress Notes (Signed)
Agree with nurses's documentation of this patient's clinic encounter.  Dontea Corlew L, MD  

## 2021-04-16 NOTE — Progress Notes (Signed)
SUBJECTIVE:  26 y.o. female complains of malodorous vaginal discharge, itching for 2 week(s). Denies abnormal vaginal bleeding or significant pelvic pain or fever. No UTI symptoms. Denies history of known exposure to STD.  Patient's last menstrual period was 03/30/2021.  OBJECTIVE:  She appears well, afebrile. Urine dipstick: not done.  ASSESSMENT:  Vaginal Discharge  Vaginal Odor Vaginal Itching   PLAN:  GC, chlamydia, trichomonas, BVAG, CVAG probe sent to lab. Treatment: To be determined once lab results are received ROV prn if symptoms persist or worsen.

## 2021-04-17 ENCOUNTER — Encounter: Payer: Self-pay | Admitting: *Deleted

## 2021-04-17 ENCOUNTER — Other Ambulatory Visit: Payer: Self-pay | Admitting: *Deleted

## 2021-04-17 DIAGNOSIS — A749 Chlamydial infection, unspecified: Secondary | ICD-10-CM

## 2021-04-17 DIAGNOSIS — B3731 Acute candidiasis of vulva and vagina: Secondary | ICD-10-CM

## 2021-04-17 DIAGNOSIS — N760 Acute vaginitis: Secondary | ICD-10-CM

## 2021-04-17 DIAGNOSIS — B9689 Other specified bacterial agents as the cause of diseases classified elsewhere: Secondary | ICD-10-CM

## 2021-04-17 LAB — CERVICOVAGINAL ANCILLARY ONLY
Bacterial Vaginitis (gardnerella): POSITIVE — AB
Candida Glabrata: NEGATIVE
Candida Vaginitis: POSITIVE — AB
Chlamydia: POSITIVE — AB
Comment: NEGATIVE
Comment: NEGATIVE
Comment: NEGATIVE
Comment: NEGATIVE
Comment: NEGATIVE
Comment: NORMAL
Neisseria Gonorrhea: NEGATIVE
Trichomonas: NEGATIVE

## 2021-04-17 MED ORDER — DOXYCYCLINE HYCLATE 100 MG PO CAPS: 100.0000 mg | ORAL_CAPSULE | Freq: Two times a day (BID) | ORAL | 0 refills | Status: DC

## 2021-04-17 MED ORDER — METRONIDAZOLE 500 MG PO TABS: 500.0000 mg | ORAL_TABLET | Freq: Two times a day (BID) | ORAL | 0 refills | Status: DC

## 2021-04-17 MED ORDER — FLUCONAZOLE 150 MG PO TABS: 150.0000 mg | ORAL_TABLET | Freq: Once | ORAL | 0 refills | Status: AC

## 2021-04-17 NOTE — Progress Notes (Signed)
TC from patient regarding vaginal swab results seen in MyChart. Reports positive BV, yeast and chlamydia. Results confirmed. RX Flagyl, Diflucan, Doxycycline respectively per protocol. MyChart message sent with education on each.

## 2021-05-09 DIAGNOSIS — Z0389 Encounter for observation for other suspected diseases and conditions ruled out: Secondary | ICD-10-CM | POA: Diagnosis not present

## 2021-05-19 ENCOUNTER — Ambulatory Visit: Payer: Medicaid Other | Admitting: Obstetrics and Gynecology

## 2021-07-09 ENCOUNTER — Ambulatory Visit: Payer: Medicaid Other | Admitting: Student

## 2021-07-24 NOTE — Progress Notes (Signed)
No Show

## 2021-08-29 ENCOUNTER — Ambulatory Visit: Payer: Medicaid Other | Admitting: Obstetrics & Gynecology

## 2021-08-29 ENCOUNTER — Ambulatory Visit: Payer: Medicaid Other | Admitting: Obstetrics

## 2021-12-02 NOTE — Congregational Nurse Program (Signed)
Nurse met with client along with her two young children on this afternoon at the [facility] shelter.  Client states that she has been in a shelter one other time several years ago.  Here today for Covid screening prior to entering shelter.  Client reports no medical concerns for herself or either of her children at this time.  She reports that she has had 2 Covid vaccines, however her children have not had any.  Both children are patients at [facility].  Plan:  Nurse screened mom and children.  All were negative.  Nurse discussed covid, flu and RSV with client in view of weather changing and encouraged mom to consider getting herself and children vaccinated Mom to let nurse know of any other medical concerns going forward while she is here at the shelter.  Daylani Deblois D. Joneen Caraway MSN, Advice worker Nurse [employer] [phone]

## 2022-01-02 ENCOUNTER — Ambulatory Visit (INDEPENDENT_AMBULATORY_CARE_PROVIDER_SITE_OTHER): Payer: Medicaid Other | Admitting: *Deleted

## 2022-01-02 VITALS — BP 129/68 | HR 86

## 2022-01-02 DIAGNOSIS — Z3201 Encounter for pregnancy test, result positive: Secondary | ICD-10-CM

## 2022-01-02 DIAGNOSIS — Z3481 Encounter for supervision of other normal pregnancy, first trimester: Secondary | ICD-10-CM

## 2022-01-02 DIAGNOSIS — Z3200 Encounter for pregnancy test, result unknown: Secondary | ICD-10-CM

## 2022-01-02 LAB — POCT URINE PREGNANCY: Preg Test, Ur: POSITIVE — AB

## 2022-01-02 MED ORDER — PROMETHAZINE HCL 25 MG PO TABS: 25.0000 mg | ORAL_TABLET | Freq: Four times a day (QID) | ORAL | 1 refills | Status: DC | PRN

## 2022-01-02 NOTE — Progress Notes (Signed)
Erin French presents today for UPT. She has no unusual complaints. LMP: 12/05/21    OBJECTIVE: Appears well, in no apparent distress.  OB History     Gravida  4   Para  2   Term  2   Preterm      AB  2   Living  2      SAB      IAB      Ectopic      Multiple  0   Live Births  2          Home UPT Result: positive In-Office UPT result: positive I have reviewed the patient's medical, obstetrical, social, and family histories, and medications.   ASSESSMENT: Positive pregnancy test  PLAN Prenatal care to be completed at: [facility] Pt will take OTC PNV RX phenergan for nausea OB US/ Intake scheduled at check out. Pt given early pregnancy bleeding and pain precautions. Verbalized understanding.

## 2022-01-16 ENCOUNTER — Telehealth: Payer: Medicaid Other | Admitting: Physician Assistant

## 2022-01-16 DIAGNOSIS — O209 Hemorrhage in early pregnancy, unspecified: Secondary | ICD-10-CM

## 2022-01-16 NOTE — Progress Notes (Signed)
Virtual Visit Consent   Erin French, you are scheduled for a virtual visit with a [facility] provider today. Just as with appointments in the office, your consent must be obtained to participate. Your consent will be active for this visit and any virtual visit you may have with one of our providers in the next 365 days. If you have a MyChart account, a copy of this consent can be sent to you electronically.  As this is a virtual visit, video technology does not allow for your provider to perform a traditional examination. This may limit your provider's ability to fully assess your condition. If your provider identifies any concerns that need to be evaluated in person or the need to arrange testing (such as labs, EKG, etc.), we will make arrangements to do so. Although advances in technology are sophisticated, we cannot ensure that it will always work on either your end or our end. If the connection with a video visit is poor, the visit may have to be switched to a telephone visit. With either a video or telephone visit, we are not always able to ensure that we have a secure connection.  By engaging in this virtual visit, you consent to the provision of healthcare and authorize for your insurance to be billed (if applicable) for the services provided during this visit. Depending on your insurance coverage, you may receive a charge related to this service.  I need to obtain your verbal consent now. Are you willing to proceed with your visit today? Erin French has provided verbal consent on 01/16/2022 for a virtual visit (video or telephone). Mar Daring, PA-C  Date: 01/16/2022 9:13 AM  Virtual Visit via Video Note   I, Mar Daring, connected with  Erin French  ([id number], Jan 12, 1996) on 01/16/22 at  8:00 AM EST by a video-enabled telemedicine application and verified that I am speaking with the correct person using two identifiers.  Location: Patient: Virtual Visit Location  Patient: Home Provider: Virtual Visit Location Provider: Home Office   I discussed the limitations of evaluation and management by telemedicine and the availability of in person appointments. The patient expressed understanding and agreed to proceed.    History of Present Illness: Erin French is a 26 y.o. who identifies as a female who was assigned female at birth, and is being seen today for vaginal spotting in first trimester pregnancy (8 weeks). Last night noticed a small amount of blood in her underwear. Then this morning after using the restroom had a small clot on the toilet paper. None in toilet. Denies heavy bleeding (not even requiring a pad at this time), abdominal pain, cramping, fevers, chills, nausea, vomiting.    Problems:  Patient Active Problem List   Diagnosis Date Noted   Severe sepsis ([place]) 02/22/2021   AKI (acute kidney injury) ([place]) 02/22/2021   Acute encephalopathy 02/22/2021   Hyponatremia 02/22/2021   Elevated transaminase level 02/22/2021   Axillary lymphadenopathy 02/22/2021   LGSIL on Pap smear of cervix 06/17/2019   Fibroid uterus 11/21/2018   S/P primary low transverse C-section 11/01/2018   Malpresentation before onset of labor 11/01/2018    Allergies: No Known Allergies Medications:  Current Outpatient Medications:    dexamethasone (DECADRON) 2 MG tablet, Take 1 tablet (2 mg total) by mouth 2 (two) times daily with a meal. Please do not stop this medication until you have been asked to do so by a family physician, Disp: 20 tablet, Rfl: 0   doxycycline (VIBRAMYCIN) 100  MG capsule, Take 1 capsule (100 mg total) by mouth 2 (two) times daily., Disp: 14 capsule, Rfl: 0   ferrous sulfate 325 (65 FE) MG tablet, Take 1 tablet (325 mg total) by mouth 2 (two) times daily with a meal. (Patient not taking: Reported on 12/06/2018), Disp: 60 tablet, Rfl: 0   metroNIDAZOLE (FLAGYL) 500 MG tablet, Take 1 tablet (500 mg total) by mouth 2 (two) times daily., Disp: 14  tablet, Rfl: 0   promethazine (PHENERGAN) 25 MG tablet, Take 1 tablet (25 mg total) by mouth every 6 (six) hours as needed for nausea or vomiting., Disp: 30 tablet, Rfl: 1 No current facility-administered medications for this visit.  Facility-Administered Medications Ordered in Other Visits:    lactated ringers infusion, , Intravenous, Continuous, Serita Grammes D, CNM   terbutaline (BRETHINE) injection 0.25 mg, 0.25 mg, Subcutaneous, Once, Serita Grammes D, CNM  Observations/Objective: Patient is well-developed, well-nourished in no acute distress.  Resting comfortably at home.  Head is normocephalic, atraumatic.  No labored breathing.  Speech is clear and coherent with logical content.  Patient is alert and oriented at baseline.    Assessment and Plan: 1. Vaginal bleeding in pregnancy, first trimester  - Minimal with no concerning signs/symptoms - Continue to monitor - Discussed warning signs and when to seek in person evaluation - Seek in person evaluation at [facility] if bleeding increases and soaking through pads  Follow Up Instructions: I discussed the assessment and treatment plan with the patient. The patient was provided an opportunity to ask questions and all were answered. The patient agreed with the plan and demonstrated an understanding of the instructions.  A copy of instructions were sent to the patient via MyChart unless otherwise noted below.    The patient was advised to call back or seek an in-person evaluation if the symptoms worsen or if the condition fails to improve as anticipated.  Time:  I spent 8 minutes with the patient via telehealth technology discussing the above problems/concerns.    Mar Daring, PA-C

## 2022-01-16 NOTE — Patient Instructions (Signed)
Erin French, thank you for joining Mar Daring, PA-C for today's virtual visit.  While this provider is not your primary care provider (PCP), if your PCP is located in our provider database this encounter information will be shared with them immediately following your visit.   [facility] account gives you access to today's visit and all your visits, tests, and labs performed at [facility] " click here if you don't have a [facility] account or go to [email or web address]  Consent: (Patient) Mana Haberl provided verbal consent for this virtual visit at the beginning of the encounter.  Current Medications:  Current Outpatient Medications:    dexamethasone (DECADRON) 2 MG tablet, Take 1 tablet (2 mg total) by mouth 2 (two) times daily with a meal. Please do not stop this medication until you have been asked to do so by a family physician, Disp: 20 tablet, Rfl: 0   doxycycline (VIBRAMYCIN) 100 MG capsule, Take 1 capsule (100 mg total) by mouth 2 (two) times daily., Disp: 14 capsule, Rfl: 0   ferrous sulfate 325 (65 FE) MG tablet, Take 1 tablet (325 mg total) by mouth 2 (two) times daily with a meal. (Patient not taking: Reported on 12/06/2018), Disp: 60 tablet, Rfl: 0   metroNIDAZOLE (FLAGYL) 500 MG tablet, Take 1 tablet (500 mg total) by mouth 2 (two) times daily., Disp: 14 tablet, Rfl: 0   promethazine (PHENERGAN) 25 MG tablet, Take 1 tablet (25 mg total) by mouth every 6 (six) hours as needed for nausea or vomiting., Disp: 30 tablet, Rfl: 1 No current facility-administered medications for this visit.  Facility-Administered Medications Ordered in Other Visits:    lactated ringers infusion, , Intravenous, Continuous, Myrtis Ser, CNM   terbutaline (BRETHINE) injection 0.25 mg, 0.25 mg, Subcutaneous, Once, Serita Grammes D, CNM   Medications ordered in this encounter:  No orders of the defined types were placed in this encounter.    *If  you need refills on other medications prior to your next appointment, please contact your pharmacy*  Follow-Up: Call back or seek an in-person evaluation if the symptoms worsen or if the condition fails to improve as anticipated.  [place] [phone]  Other Instructions  Vaginal Bleeding During Pregnancy, First Trimester A small amount of bleeding from the vagina, or spotting, is common during early pregnancy. Some bleeding may be related to the pregnancy, and some may not. In many cases, the bleeding is normal and is not a problem. However, bleeding can also be a sign of something serious. Normal things that may cause bleeding during the first trimester: Implantation of the fertilized egg in the lining of the uterus. Rapid changes in blood vessels. This is caused by changes that are happening to the body during pregnancy. Sex. Pelvic exams. Abnormal things that may cause bleeding during the first trimester include: Infection or inflammation of the cervix. Growths or polyps on the cervix. Miscarriage or threatened miscarriage. Pregnancy that is growing outside of the uterus (ectopic pregnancy). A fertilized egg that becomes a mass of tissue (molar pregnancy). Tell your health care provider right away if there is any bleeding from your vagina. Follow these instructions at home: Monitoring your bleeding Monitor your bleeding. Pay attention to any changes in your symptoms. Let your health care provider know about any concerns. Try to understand when the bleeding occurs. Does the bleeding start on its own, or does it start after something is done, such as sex or a pelvic  exam? Use a diary to record the things you see about your bleeding, including: The kind of bleeding you are having. Does the bleeding start and stop irregularly, or is it a constant flow? The severity of your bleeding. Is the bleeding heavy or light? The number of pads you use each day, how often you  change them, and how soaked they are. Tell your health care provider if you pass tissue. He or she may want to see it.  Activity Follow instructions from your health care provider about limiting your activity. Ask what activities are safe for you. Do not have sex until your health care provider says that this is safe. If needed, make plans for someone to help with your regular activities. General instructions Take over-the-counter and prescription medicines only as told by your health care provider. Do not take aspirin because it can cause bleeding. Do not use tampons or douche. Keep all follow-up visits. This is important. Contact a health care provider if: You have vaginal bleeding during any part of your pregnancy. You have cramps or labor pains. You have a fever or chills. Get help right away if: You have severe cramps in your back or abdomen. You pass large clots or a large amount of tissue from your vagina. Your bleeding increases. You feel light-headed or weak, or you faint. You are leaking fluid or have a gush of fluid from your vagina. Summary A small amount of bleeding from the vagina is common during early pregnancy. Be sure to tell your health care provider about any vaginal bleeding right away. Try to understand when bleeding occurs. Does bleeding occur on its own, or does it occur after something is done, such as sex or pelvic exams? Keep all follow-up visits. This is important. This information is not intended to replace advice given to you by your health care provider. Make sure you discuss any questions you have with your health care provider. Document Revised: 11/02/2019 Document Reviewed: 11/02/2019 Elsevier Patient Education  [place].    If you have been instructed to have an in-person evaluation today at a local Urgent Care facility, please use the link below. It will take you to a list of all of our available [facility], including  address, phone number and hours of operation. Please do not delay care.  [facility]  If you or a family member do not have a primary care provider, use the link below to schedule a visit and establish care. When you choose a [facility] primary care physician or advanced practice provider, you gain a long-term partner in health. Find a Primary Care Provider  Learn more about 's in-office and virtual care options: [facility]

## 2022-01-17 ENCOUNTER — Inpatient Hospital Stay (HOSPITAL_COMMUNITY)
Admission: AD | Admit: 2022-01-17 | Discharge: 2022-01-17 | Disposition: A | Discharge status: Home / Self Care | Payer: Medicaid Other | Attending: Obstetrics and Gynecology | Admitting: Obstetrics and Gynecology

## 2022-01-17 ENCOUNTER — Encounter (HOSPITAL_COMMUNITY): Payer: Self-pay | Admitting: Obstetrics and Gynecology

## 2022-01-17 ENCOUNTER — Inpatient Hospital Stay (HOSPITAL_COMMUNITY): Payer: Medicaid Other

## 2022-01-17 DIAGNOSIS — N939 Abnormal uterine and vaginal bleeding, unspecified: Secondary | ICD-10-CM | POA: Diagnosis not present

## 2022-01-17 DIAGNOSIS — O26891 Other specified pregnancy related conditions, first trimester: Secondary | ICD-10-CM | POA: Diagnosis not present

## 2022-01-17 DIAGNOSIS — Z3491 Encounter for supervision of normal pregnancy, unspecified, first trimester: Secondary | ICD-10-CM

## 2022-01-17 DIAGNOSIS — O3411 Maternal care for benign tumor of corpus uteri, first trimester: Secondary | ICD-10-CM | POA: Insufficient documentation

## 2022-01-17 DIAGNOSIS — Z3A01 Less than 8 weeks gestation of pregnancy: Secondary | ICD-10-CM | POA: Diagnosis not present

## 2022-01-17 DIAGNOSIS — O209 Hemorrhage in early pregnancy, unspecified: Secondary | ICD-10-CM | POA: Diagnosis not present

## 2022-01-17 LAB — URINALYSIS, ROUTINE W REFLEX MICROSCOPIC
Bilirubin Urine: NEGATIVE
Glucose, UA: NEGATIVE mg/dL
Ketones, ur: NEGATIVE mg/dL
Nitrite: NEGATIVE
Protein, ur: NEGATIVE mg/dL
Specific Gravity, Urine: 1.012 (ref 1.005–1.030)
pH: 8 (ref 5.0–8.0)

## 2022-01-17 LAB — CBC
HCT: 32.2 % — ABNORMAL LOW (ref 36.0–46.0)
Hemoglobin: 10.3 g/dL — ABNORMAL LOW (ref 12.0–15.0)
MCH: 25.1 pg — ABNORMAL LOW (ref 26.0–34.0)
MCHC: 32 g/dL (ref 30.0–36.0)
MCV: 78.5 fL — ABNORMAL LOW (ref 80.0–100.0)
Platelets: 302 10*3/uL (ref 150–400)
RBC: 4.1 MIL/uL (ref 3.87–5.11)
RDW: 15 % (ref 11.5–15.5)
WBC: 3.3 10*3/uL — ABNORMAL LOW (ref 4.0–10.5)
nRBC: 0 % (ref 0.0–0.2)

## 2022-01-17 LAB — HCG, QUANTITATIVE, PREGNANCY: hCG, Beta Chain, Quant, S: 98680 m[IU]/mL — ABNORMAL HIGH (ref <5)

## 2022-01-17 LAB — WET PREP, GENITAL
Sperm: NONE SEEN
Trich, Wet Prep: NONE SEEN
WBC, Wet Prep HPF POC: >=10 — AB (ref <10)
Yeast Wet Prep HPF POC: NONE SEEN

## 2022-01-17 LAB — HIV ANTIBODY (ROUTINE TESTING W REFLEX): HIV Screen 4th Generation wRfx: NONREACTIVE

## 2022-01-17 NOTE — MAU Note (Signed)
.  Erin French is a 26 y.o. at 12w1dhere in MAU reporting: started having vaginal bleeding yesterday. More than a panty liner today. C/o abd pain as well. Denies any recent intercourse  LMP: 12/05/21 Onset of complaint: yesterday Pain score: 5 There were no vitals filed for this visit.   FHT:n/a Lab orders placed from triage:  u/a

## 2022-01-17 NOTE — Discharge Instructions (Signed)
Prenatal Care Providers           [facility]  [address] [phone]  [facility]   [address]  [phone]  [facility] [place]       [address] [phone]            [facility] [place]     [address] [phone]          [facility] [place]   [address] [phone]  [facility]  [address] [phone]     [facility]  [address]   [phone]     [facility]  Phone: [phone]  [facility]  Phone: [phone]  [facility] Phone: [phone]  [facility] Phone: [phone]  [facility] Phone: [phone]   [facility] Phone: [phone]  [facility]  Phone: [phone] Safe Medications in Pregnancy   Acne: Benzoyl Peroxide Salicylic Acid  Backache/Headache: Tylenol: 2 regular strength every 4 hours OR              2 Extra strength every 6 hours  Colds/Coughs/Allergies: Benadryl (alcohol free) 25 mg every 6 hours as needed Breath right strips Claritin Cepacol throat lozenges Chloraseptic throat spray Cold-Eeze- up to three times per day Cough drops, alcohol free Flonase (by prescription only) Guaifenesin Mucinex Robitussin DM (plain only, alcohol free) Saline nasal spray/drops Sudafed (pseudoephedrine) & Actifed ** use only after [redacted] weeks gestation and if you do not have high blood pressure Tylenol Vicks Vaporub Zinc lozenges Zyrtec   Constipation: Colace Ducolax suppositories Fleet enema Glycerin suppositories Metamucil Milk of magnesia Miralax Senokot Smooth move tea  Diarrhea: Kaopectate Imodium A-D  *NO pepto  Bismol  Hemorrhoids: Anusol Anusol HC Preparation H Tucks  Indigestion: Tums Maalox Mylanta Zantac  Pepcid  Insomnia: Benadryl (alcohol free) 25mg every 6 hours as needed Tylenol PM Unisom, no Gelcaps  Leg Cramps: Tums MagGel  Nausea/Vomiting:  Bonine Dramamine Emetrol Ginger extract Sea bands Meclizine  Nausea medication to take during pregnancy:  Unisom (doxylamine succinate 25 mg tablets) Take one tablet daily at bedtime. If symptoms are not adequately controlled, the dose can be increased to a maximum recommended dose of two tablets daily (1/2 tablet in the morning, 1/2 tablet mid-afternoon and one at bedtime). Vitamin B6 100mg tablets. Take one tablet twice a day (up to 200 mg per day).  Skin Rashes: Aveeno products Benadryl cream or 25mg every 6 hours as needed Calamine Lotion 1% cortisone cream  Yeast infection: Gyne-lotrimin 7 Monistat 7   **If taking multiple medications, please check labels to avoid duplicating the same active ingredients **take medication as directed on the label ** Do not exceed 4000 mg of tylenol in 24 hours **Do not take medications that contain aspirin or ibuprofen    

## 2022-01-17 NOTE — MAU Provider Note (Signed)
History     CSN: [id number]  Arrival date and time: 01/17/22 1102   Event Date/Time   First Provider Initiated Contact with Patient 01/17/22 1141      Chief Complaint  Patient presents with   Vaginal Bleeding   HPI  Erin French is a 26 y.o. [id number] at 61w1dwho presents for evaluation of vaginal bleeding. Patient reports she is seeing light pink spotting when she wipes. She reports it "feels like enough to fill up a pad" but she is not seeing any on her pad. She reports she only sees it when she wipes. She is unsure if she is having pain.  She denies any discharge. Denies any constipation, diarrhea or any urinary complaints.  OB History     Gravida  5   Para  2   Term  2   Preterm      AB  2   Living  2      SAB      IAB      Ectopic      Multiple  0   Live Births  2           Past Medical History:  Diagnosis Date   Chlamydia 4/12    Past Surgical History:  Procedure Laterality Date   CESAREAN SECTION N/A 11/17/2018   Procedure: CESAREAN SECTION;  Surgeon: PAletha Halim MD;  Location: [facility] LD ORS;  Service: Obstetrics;  Laterality: N/A;   INDUCED ABORTION      Family History  Problem Relation Age of Onset   Hypertension Father    Schizophrenia Mother    Hypertension Maternal Grandmother     Social History   Tobacco Use   Smoking status: Never   Smokeless tobacco: Never  Vaping Use   Vaping Use: Never used  Substance Use Topics   Alcohol use: Not Currently    Alcohol/week: 0.0 standard drinks of alcohol    Comment: Last drink December 2019   Drug use: No    Allergies: No Known Allergies  No medications prior to admission.    Review of Systems  Constitutional: Negative.  Negative for fatigue and fever.  HENT: Negative.    Respiratory: Negative.  Negative for shortness of breath.   Cardiovascular: Negative.  Negative for chest pain.  Gastrointestinal: Negative.  Negative for abdominal pain, constipation, diarrhea, nausea  and vomiting.  Genitourinary:  Positive for vaginal bleeding. Negative for dysuria.  Neurological: Negative.  Negative for dizziness and headaches.   Physical Exam   Blood pressure 130/70, pulse 93, temperature 98.7 F (37.1 C), temperature source Oral, resp. rate 18, height '5\' 5"'$  (1.651 m), weight 59 kg, last menstrual period 12/05/2021, SpO2 99 %, currently breastfeeding.  Patient Vitals for the past 24 hrs:  BP Temp Temp src Pulse Resp SpO2 Height Weight  01/17/22 1239 130/70 98.7 F (37.1 C) Oral 93 18 99 % -- --  01/17/22 1129 128/75 97.8 F (36.6 C) Oral 90 17 100 % -- --  01/17/22 1113 128/72 98.6 F (37 C) -- (!) 104 18 -- '5\' 5"'$  (1.651 m) 59 kg    Physical Exam Vitals and nursing note reviewed.  Constitutional:      General: She is not in acute distress.    Appearance: She is well-developed.  HENT:     Head: Normocephalic.  Eyes:     Pupils: Pupils are equal, round, and reactive to light.  Cardiovascular:     Rate and Rhythm: Normal rate and regular rhythm.  Heart sounds: Normal heart sounds.  Pulmonary:     Effort: Pulmonary effort is normal. No respiratory distress.     Breath sounds: Normal breath sounds.  Abdominal:     General: Bowel sounds are normal. There is no distension.     Palpations: Abdomen is soft.     Tenderness: There is no abdominal tenderness.  Skin:    General: Skin is warm and dry.  Neurological:     Mental Status: She is alert and oriented to person, place, and time.  Psychiatric:        Mood and Affect: Mood normal.        Behavior: Behavior normal.        Thought Content: Thought content normal.        Judgment: Judgment normal.      MAU Course  Procedures  Results for orders placed or performed during the hospital encounter of 01/17/22 (from the past 24 hour(s))  CBC     Status: Abnormal   Collection Time: 01/17/22 11:16 AM  Result Value Ref Range   WBC 3.3 (L) 4.0 - 10.5 K/uL   RBC 4.10 3.87 - 5.11 MIL/uL   Hemoglobin  10.3 (L) 12.0 - 15.0 g/dL   HCT 32.2 (L) 36.0 - 46.0 %   MCV 78.5 (L) 80.0 - 100.0 fL   MCH 25.1 (L) 26.0 - 34.0 pg   MCHC 32.0 30.0 - 36.0 g/dL   RDW 15.0 11.5 - 15.5 %   Platelets 302 150 - 400 K/uL   nRBC 0.0 0.0 - 0.2 %  hCG, quantitative, pregnancy     Status: Abnormal   Collection Time: 01/17/22 11:16 AM  Result Value Ref Range   hCG, Beta Chain, Quant, S 98,680 (H) <5 mIU/mL  HIV Antibody (routine testing w rflx)     Status: None   Collection Time: 01/17/22 11:16 AM  Result Value Ref Range   HIV Screen 4th Generation wRfx Non Reactive Non Reactive  Urinalysis, Routine w reflex microscopic     Status: Abnormal   Collection Time: 01/17/22 11:39 AM  Result Value Ref Range   Color, Urine YELLOW YELLOW   APPearance HAZY (A) CLEAR   Specific Gravity, Urine 1.012 1.005 - 1.030   pH 8.0 5.0 - 8.0   Glucose, UA NEGATIVE NEGATIVE mg/dL   Hgb urine dipstick SMALL (A) NEGATIVE   Bilirubin Urine NEGATIVE NEGATIVE   Ketones, ur NEGATIVE NEGATIVE mg/dL   Protein, ur NEGATIVE NEGATIVE mg/dL   Nitrite NEGATIVE NEGATIVE   Leukocytes,Ua MODERATE (A) NEGATIVE   RBC / HPF 11-20 0 - 5 RBC/hpf   WBC, UA 11-20 0 - 5 WBC/hpf   Bacteria, UA RARE (A) NONE SEEN   Squamous Epithelial / LPF 11-20 0 - 5   Mucus PRESENT   Wet prep, genital     Status: Abnormal   Collection Time: 01/17/22 12:45 PM  Result Value Ref Range   Yeast Wet Prep HPF POC NONE SEEN NONE SEEN   Trich, Wet Prep NONE SEEN NONE SEEN   Clue Cells Wet Prep HPF POC PRESENT (A) NONE SEEN   WBC, Wet Prep HPF POC >=10 (A) <10   Sperm NONE SEEN      US OB LESS THAN 14 WEEKS WITH OB TRANSVAGINAL  Result Date: 01/17/2022 CLINICAL DATA:  26 year old pregnant female with bleeding. EXAM: OBSTETRIC <14 WK [place] AND TRANSVAGINAL OB US TECHNIQUE: Both transabdominal and transvaginal ultrasound examinations were performed for complete evaluation of the gestation as well as  the maternal uterus, adnexal regions, and pelvic cul-de-sac.  Transvaginal technique was performed to assess early pregnancy. COMPARISON:  02/22/2021 CT and prior ultrasounds. FINDINGS: Intrauterine gestational sac: Single Yolk sac:  Visualized. Embryo:  Visualized. Cardiac Activity: Visualized. Heart Rate: 123 bpm CRL:  6 mm   6 w   3 d                  [place] EDC: 09/09/2022 Subchorionic hemorrhage:  None visualized. Maternal uterus/adnexae: A 2.4 cm anterior calcified uterine body fibroid is again noted. There is no evidence of adnexal mass or free fluid. Ovaries are unremarkable. IMPRESSION: 1. Single living intrauterine gestation with estimated gestational age of [redacted] weeks 3 days by this ultrasound. No evidence of subchorionic hemorrhage. 2. Calcified 2.4 cm anterior uterine body fibroid. Electronically Signed   By: Margarette Canada M.D.   On: 01/17/2022 12:46     MDM Labs ordered and reviewed.   UA, UPT CBC, HCG ABO/Rh- O Pos Wet prep and gc/chlamydia US OB Comp Less 14 weeks with Transvaginal  CNM independently reviewed the imaging ordered. Imaging show live IUP consistent with dates.  Clue cells present on wet prep but patient denies any foul odor or irritation. Will hold treatment at this time  Assessment and Plan   1. Normal intrauterine pregnancy on prenatal ultrasound in first trimester   2. [redacted] weeks gestation of pregnancy   3. Vaginal spotting     -Discharge home in stable condition -First trimester precautions discussed -Patient advised to follow-up with OB to establish prenatal care -Patient may return to MAU as needed or if her condition were to change or worsen  Wende Mott, CNM 01/17/2022, 11:41 AM

## 2022-01-19 LAB — GC/CHLAMYDIA PROBE AMP (~~LOC~~) NOT AT ARMC
Chlamydia: POSITIVE — AB
Comment: NEGATIVE
Comment: NORMAL
Neisseria Gonorrhea: NEGATIVE

## 2022-01-20 ENCOUNTER — Telehealth: Payer: Self-pay

## 2022-01-20 DIAGNOSIS — A749 Chlamydial infection, unspecified: Secondary | ICD-10-CM

## 2022-01-20 MED ORDER — AZITHROMYCIN 250 MG PO TABS: 1000.0000 mg | ORAL_TABLET | Freq: Once | ORAL | 0 refills | Status: AC

## 2022-01-20 NOTE — Telephone Encounter (Signed)
Called patient to review positive chlamydia. RX sent to pharmacy. Patient verbalized understanding. STI precautions reviewed and will do TOC in office.   Wende Mott, CNM 01/20/22 4:53 PM

## 2022-03-20 ENCOUNTER — Emergency Department (HOSPITAL_COMMUNITY)
Admission: EM | Admit: 2022-03-20 | Discharge: 2022-03-20 | Disposition: A | Discharge status: Home / Self Care | Payer: Medicaid Other | Attending: Emergency Medicine | Admitting: Emergency Medicine

## 2022-03-20 ENCOUNTER — Emergency Department (HOSPITAL_COMMUNITY): Payer: Medicaid Other

## 2022-03-20 DIAGNOSIS — Z20822 Contact with and (suspected) exposure to covid-19: Secondary | ICD-10-CM | POA: Diagnosis not present

## 2022-03-20 DIAGNOSIS — R112 Nausea with vomiting, unspecified: Secondary | ICD-10-CM | POA: Diagnosis not present

## 2022-03-20 DIAGNOSIS — R197 Diarrhea, unspecified: Secondary | ICD-10-CM | POA: Diagnosis not present

## 2022-03-20 DIAGNOSIS — J069 Acute upper respiratory infection, unspecified: Secondary | ICD-10-CM | POA: Diagnosis not present

## 2022-03-20 DIAGNOSIS — R059 Cough, unspecified: Secondary | ICD-10-CM | POA: Diagnosis not present

## 2022-03-20 LAB — URINALYSIS, ROUTINE W REFLEX MICROSCOPIC
Bilirubin Urine: NEGATIVE
Glucose, UA: NEGATIVE mg/dL
Ketones, ur: 5 mg/dL — AB
Leukocytes,Ua: NEGATIVE
Nitrite: NEGATIVE
Protein, ur: 30 mg/dL — AB
Specific Gravity, Urine: 1.028 (ref 1.005–1.030)
pH: 5 (ref 5.0–8.0)

## 2022-03-20 LAB — RESP PANEL BY RT-PCR (RSV, FLU A&B, COVID)  RVPGX2
Influenza A by PCR: NEGATIVE
Influenza B by PCR: NEGATIVE
Resp Syncytial Virus by PCR: NEGATIVE
SARS Coronavirus 2 by RT PCR: NEGATIVE

## 2022-03-20 LAB — PREGNANCY, URINE: Preg Test, Ur: NEGATIVE

## 2022-03-20 MED ORDER — ONDANSETRON HCL 4 MG PO TABS: 4.0000 mg | ORAL_TABLET | Freq: Three times a day (TID) | ORAL | 0 refills | Status: DC | PRN

## 2022-03-20 MED ORDER — BENZONATATE 100 MG PO CAPS: 100.0000 mg | ORAL_CAPSULE | Freq: Three times a day (TID) | ORAL | 0 refills | Status: DC

## 2022-03-20 NOTE — ED Triage Notes (Signed)
Patient here with complaint of cough, generalized body aches, and runny nose for the last three weeks. Patient is alert, oriented, speaking in complete sentences, ambulating independently with steady gait, and is in no apparent distress a this time.

## 2022-03-20 NOTE — Discharge Instructions (Addendum)
You were evaluated today for fevers and a cough. These symptoms are consistent with a viral infection. Please take over the counter medication such as acetaminophen and ibuprofen as needed for fever control. I have prescribed Zofran for nausea and Tessalon for cough. Please take as directed. Please follow up with primary care. If you develop any life threatening symptoms please return to the emergency department.

## 2022-03-20 NOTE — ED Notes (Signed)
Pt called for triage and unable to locate in lobby

## 2022-03-20 NOTE — ED Provider Notes (Signed)
Erin French   CSN: [id number] Arrival date & time: 03/20/22  0102     History  Chief Complaint  Patient presents with   Cough    Erin French is a 27 y.o. female.  Patient presents the emergency room complaining of vague cough and cold symptoms for the past 3-week.  She states that over the past 5 to 7 days she has been having some intermittent nausea and vomiting with occasional diarrhea continues to have a cough with some congestion.  Denies abdominal pain, shortness of breath, chest pain, urinary symptoms.  In triage the patient initially denied fevers.  To me she states she has had fevers as high as 102, 103 F intermittently over the past week.  Patient is not pregnant at this time despite history on chart.  Patient with history of induced abortion.  HPI     Home Medications Prior to Admission medications   Medication Sig Start Date End Date Taking? Authorizing Provider  benzonatate (TESSALON) 100 MG capsule Take 1 capsule (100 mg total) by mouth every 8 (eight) hours. 03/20/22  Yes Cherlynn June B, PA-C  ondansetron (ZOFRAN) 4 MG tablet Take 1 tablet (4 mg total) by mouth every 8 (eight) hours as needed for nausea or vomiting. 03/20/22  Yes Dorothyann Peng, PA-C  ferrous sulfate 325 (65 FE) MG tablet Take 1 tablet (325 mg total) by mouth 2 (two) times daily with a meal. Patient not taking: Reported on 12/06/2018 11/19/18 12/19/18  Lattie Haw, MD  promethazine (PHENERGAN) 25 MG tablet Take 1 tablet (25 mg total) by mouth every 6 (six) hours as needed for nausea or vomiting. 01/02/22   Griffin Basil, MD      Allergies    Patient has no known allergies.    Review of Systems   Review of Systems  Constitutional:  Positive for fever.  Respiratory:  Positive for cough. Negative for shortness of breath.   Cardiovascular:  Negative for chest pain.  Gastrointestinal:  Positive for diarrhea, nausea and vomiting.  Negative for abdominal pain.  Genitourinary:  Negative for dysuria.    Physical Exam Updated Vital Signs BP (!) 141/95 (BP Location: Right Arm)   Pulse 95   Temp 98 F (36.7 C) (Oral)   Resp 16   LMP 12/05/2021 (Exact Date)   SpO2 98%  Physical Exam Vitals and nursing French reviewed.  Constitutional:      General: She is not in acute distress.    Appearance: She is well-developed.  HENT:     Head: Normocephalic and atraumatic.  Eyes:     Conjunctiva/sclera: Conjunctivae normal.  Cardiovascular:     Rate and Rhythm: Normal rate and regular rhythm.     Heart sounds: No murmur heard. Pulmonary:     Effort: Pulmonary effort is normal. No respiratory distress.     Breath sounds: Normal breath sounds.  Abdominal:     Palpations: Abdomen is soft.     Tenderness: There is no abdominal tenderness.  Musculoskeletal:        General: No swelling.     Cervical back: Neck supple.  Skin:    General: Skin is warm and dry.     Capillary Refill: Capillary refill takes less than 2 seconds.  Neurological:     Mental Status: She is alert.  Psychiatric:        Mood and Affect: Mood normal.     ED Results / Procedures / Treatments  Labs (all labs ordered are listed, but only abnormal results are displayed) Labs Reviewed  URINALYSIS, ROUTINE W REFLEX MICROSCOPIC - Abnormal; Notable for the following components:      Result Value   APPearance HAZY (*)    Hgb urine dipstick SMALL (*)    Ketones, ur 5 (*)    Protein, ur 30 (*)    Bacteria, UA RARE (*)    All other components within normal limits  RESP PANEL BY RT-PCR (RSV, FLU A&B, COVID)  RVPGX2  PREGNANCY, URINE    EKG None  Radiology DG Chest 2 View  Result Date: 03/20/2022 CLINICAL DATA:  Cough. EXAM: CHEST - 2 VIEW COMPARISON:  02/22/2021. FINDINGS: Clear lungs. Normal heart size and mediastinal contours. No pleural effusion or pneumothorax. Visualized bones and upper abdomen are unremarkable. IMPRESSION: No evidence of  acute cardiopulmonary disease. Electronically Signed   By: Emmit Alexanders M.D.   On: 03/20/2022 11:38    Procedures Procedures    Medications Ordered in ED Medications - No data to display  ED Course/ Medical Decision Making/ A&P                             Medical Decision Making Risk Prescription drug management.   Patient presents with symptoms of nausea, vomiting, cough, fever.  Differential includes was not limited to COVID-19, influenza, RSV, other viral illnesses, and others  There is no recent relevant medical history for review.  There are no comorbidities  I ordered and reviewed labs.  Respiratory panel is negative.  Urine pregnancy test was negative.  UA not indicating infection at this time  I ordered and interpreted imaging including a chest x-ray.  No acute disease was noted.  I agree with radiologist findings  At this time patient's symptoms are consistent with a viral infection but she is negative for COVID, RSV, and influenza.  Plan to discharge patient home with recommendations for alternating acetaminophen and ibuprofen.  I have prescribed Tessalon Per[place] after confirming the patient was not pregnant and have prescribed Zofran for nausea.  Return precautions provided        Final Clinical Impression(s) / ED Diagnoses Final diagnoses:  Viral URI with cough    Rx / DC Orders ED Discharge Orders          Ordered    benzonatate (TESSALON) 100 MG capsule  Every 8 hours,   Status:  Discontinued        03/20/22 1508    ondansetron (ZOFRAN) 4 MG tablet  Every 8 hours PRN        03/20/22 1508    benzonatate (TESSALON) 100 MG capsule  Every 8 hours        03/20/22 1511              Ronny Bacon 03/20/22 Bristow, Baldwin, MD 03/22/22 [phone]

## 2022-03-20 NOTE — ED Provider Triage Note (Signed)
Emergency Medicine Provider Triage Evaluation Note  Erin French , a 27 y.o. female  was evaluated in triage.  Pt complains of cough and cold symptoms for the past 3 weeks but worsening this past week.  She reports that she has been having some nausea and vomiting as well.  Some diarrhea but no abdominal pain.  No fevers.  Review of Systems  Positive:  Negative:   Physical Exam  BP (!) 141/95 (BP Location: Right Arm)   Pulse 95   Temp 98 F (36.7 C) (Oral)   Resp 16   LMP 12/05/2021 (Exact Date)   SpO2 98%  Gen:   Awake, no distress   Resp:  Normal effort  MSK:   Moves extremities without difficulty  Other:  CTAB.  Medical Decision Making  Medically screening exam initiated at 11:09 AM.  Appropriate orders placed.  Erin French was informed that the remainder of the evaluation will be completed by another provider, this initial triage assessment does not replace that evaluation, and the importance of remaining in the ED until their evaluation is complete.  Swab and CXR ordered   Sherrell Puller, PA-C 03/20/22 1109

## 2022-03-23 ENCOUNTER — Telehealth: Payer: Self-pay | Admitting: *Deleted

## 2022-03-23 NOTE — Patient Outreach (Signed)
  Care Coordination [facility] Note Transition Care Management Unsuccessful Follow-up Telephone Call  Date of discharge and from where:  03/20/22 from [facility]  Attempts:  1st Attempt  Reason for unsuccessful TCM follow-up call:  No answer/busy   Lurena Joiner RN, [place] RN Care Coordinator

## 2022-08-01 ENCOUNTER — Inpatient Hospital Stay (HOSPITAL_COMMUNITY): Payer: Medicaid Other

## 2022-08-01 ENCOUNTER — Other Ambulatory Visit: Payer: Self-pay

## 2022-08-01 ENCOUNTER — Encounter (HOSPITAL_COMMUNITY): Payer: Self-pay

## 2022-08-01 ENCOUNTER — Inpatient Hospital Stay (HOSPITAL_COMMUNITY)
Admission: AD | Admit: 2022-08-01 | Discharge: 2022-08-01 | Disposition: A | Discharge status: Home / Self Care | Payer: Medicaid Other | Attending: Obstetrics and Gynecology | Admitting: Obstetrics and Gynecology

## 2022-08-01 DIAGNOSIS — O26891 Other specified pregnancy related conditions, first trimester: Secondary | ICD-10-CM | POA: Diagnosis present

## 2022-08-01 DIAGNOSIS — N760 Acute vaginitis: Secondary | ICD-10-CM | POA: Diagnosis not present

## 2022-08-01 DIAGNOSIS — O23592 Infection of other part of genital tract in pregnancy, second trimester: Secondary | ICD-10-CM | POA: Insufficient documentation

## 2022-08-01 DIAGNOSIS — Z3A09 9 weeks gestation of pregnancy: Secondary | ICD-10-CM | POA: Insufficient documentation

## 2022-08-01 DIAGNOSIS — B9689 Other specified bacterial agents as the cause of diseases classified elsewhere: Secondary | ICD-10-CM | POA: Diagnosis not present

## 2022-08-01 DIAGNOSIS — O23591 Infection of other part of genital tract in pregnancy, first trimester: Secondary | ICD-10-CM | POA: Insufficient documentation

## 2022-08-01 DIAGNOSIS — Z3491 Encounter for supervision of normal pregnancy, unspecified, first trimester: Secondary | ICD-10-CM

## 2022-08-01 LAB — WET PREP, GENITAL
Sperm: NONE SEEN
Trich, Wet Prep: NONE SEEN
WBC, Wet Prep HPF POC: >=10 — AB (ref <10)
Yeast Wet Prep HPF POC: NONE SEEN

## 2022-08-01 LAB — CBC
HCT: 30.2 % — ABNORMAL LOW (ref 36.0–46.0)
Hemoglobin: 9.4 g/dL — ABNORMAL LOW (ref 12.0–15.0)
MCH: 25.1 pg — ABNORMAL LOW (ref 26.0–34.0)
MCHC: 31.1 g/dL (ref 30.0–36.0)
MCV: 80.5 fL (ref 80.0–100.0)
Platelets: 319 10*3/uL (ref 150–400)
RBC: 3.75 MIL/uL — ABNORMAL LOW (ref 3.87–5.11)
RDW: 12.8 % (ref 11.5–15.5)
WBC: 4.3 10*3/uL (ref 4.0–10.5)
nRBC: 0 % (ref 0.0–0.2)

## 2022-08-01 LAB — URINALYSIS, ROUTINE W REFLEX MICROSCOPIC
Bacteria, UA: NONE SEEN
Bilirubin Urine: NEGATIVE
Glucose, UA: NEGATIVE mg/dL
Ketones, ur: NEGATIVE mg/dL
Nitrite: NEGATIVE
Protein, ur: NEGATIVE mg/dL
RBC / HPF: >50 RBC/hpf (ref 0–5)
Specific Gravity, Urine: 1.018 (ref 1.005–1.030)
pH: 6 (ref 5.0–8.0)

## 2022-08-01 LAB — HCG, QUANTITATIVE, PREGNANCY: hCG, Beta Chain, Quant, S: >250000 m[IU]/mL — ABNORMAL HIGH (ref <5)

## 2022-08-01 MED ORDER — METRONIDAZOLE 1 % EX GEL: Freq: Every day | CUTANEOUS | 0 refills | Status: DC

## 2022-08-01 NOTE — MAU Note (Addendum)
.  Erin French is a 27 y.o. here in MAU reporting: Pt reports she has been extremely tired for 3 weeks. Pt reports that taking prenatals ruins her day by making her sick. Denies vaginal bleeding. Pt reports lower abdominal pain for 1 week.  LMP: 05/25/2022. Onset of complaint: 3 weeks ago Pain score: 7/10 There were no vitals filed for this visit.   Lab orders placed from triage:   POCT preg/urine

## 2022-08-01 NOTE — Discharge Instructions (Signed)

## 2022-08-01 NOTE — MAU Provider Note (Signed)
History     CSN: [id number]  Arrival date and time: 08/01/22 1623   Event Date/Time   First Provider Initiated Contact with Patient 08/01/22 1658      Chief Complaint  Patient presents with   Abdominal Pain   Fatigue   HPI  Erin French is a 27 y.o. [id number] at [redacted]w[redacted]d who presents for evaluation of fatigue and abdominal pain. Patient reports she has been very tired for the last 3 weeks. She also reports abdominal pain x1 week. Patient rates the pain as a 7/10 and has not tried anything for the pain. She is also concerned that her prenatal vitamins make her sick when she takes them in the morning. She denies any vaginal bleeding. Denies any constipation, diarrhea or any urinary complaints.   OB History     Gravida  6   Para  2   Term  2   Preterm      AB  3   Living  2      SAB      IAB  1   Ectopic      Multiple  0   Live Births  2           Past Medical History:  Diagnosis Date   Chlamydia 4/12    Past Surgical History:  Procedure Laterality Date   CESAREAN SECTION N/A 11/17/2018   Procedure: CESAREAN SECTION;  Surgeon: [place] Bing, MD;  Location: [facility] LD ORS;  Service: Obstetrics;  Laterality: N/A;   INDUCED ABORTION      Family History  Problem Relation Age of Onset   Hypertension Father    Schizophrenia Mother    Hypertension Maternal Grandmother     Social History   Tobacco Use   Smoking status: Never   Smokeless tobacco: Never  Vaping Use   Vaping Use: Never used  Substance Use Topics   Alcohol use: Not Currently    Alcohol/week: 0.0 standard drinks of alcohol    Comment: Last drink December 2019   Drug use: No    Allergies: No Known Allergies  No medications prior to admission.    Review of Systems  Constitutional:  Positive for fatigue. Negative for fever.  HENT: Negative.    Respiratory: Negative.  Negative for shortness of breath.   Cardiovascular: Negative.  Negative for chest pain.  Gastrointestinal:  Positive  for abdominal pain. Negative for constipation, diarrhea, nausea and vomiting.  Genitourinary: Negative.  Negative for dysuria, vaginal bleeding and vaginal discharge.  Neurological: Negative.  Negative for dizziness and headaches.   Physical Exam   Blood pressure 136/73, pulse 100, temperature 98.6 F (37 C), resp. rate 16, last menstrual period 05/25/2022, SpO2 97 %, unknown if currently breastfeeding.  Patient Vitals for the past 24 hrs:  BP Temp Pulse Resp SpO2  08/01/22 1809 136/73 -- 100 -- --  08/01/22 1655 134/70 -- (!) 105 -- --  08/01/22 1654 -- 98.6 F (37 C) -- 16 97 %    Physical Exam Vitals and nursing note reviewed.  Constitutional:      General: She is not in acute distress.    Appearance: She is well-developed.  HENT:     Head: Normocephalic.  Eyes:     Pupils: Pupils are equal, round, and reactive to light.  Cardiovascular:     Rate and Rhythm: Normal rate and regular rhythm.     Heart sounds: Normal heart sounds.  Pulmonary:     Effort: Pulmonary effort is normal. No  respiratory distress.     Breath sounds: Normal breath sounds.  Abdominal:     General: Bowel sounds are normal. There is no distension.     Palpations: Abdomen is soft.     Tenderness: There is no abdominal tenderness.  Skin:    General: Skin is warm and dry.  Neurological:     Mental Status: She is alert and oriented to person, place, and time.  Psychiatric:        Mood and Affect: Mood normal.        Behavior: Behavior normal.        Thought Content: Thought content normal.        Judgment: Judgment normal.     MAU Course  Procedures  Results for orders placed or performed during the hospital encounter of 08/01/22 (from the past 24 hour(s))  Urinalysis, Routine w reflex microscopic -Urine, Clean Catch     Status: Abnormal   Collection Time: 08/01/22  4:48 PM  Result Value Ref Range   Color, Urine YELLOW YELLOW   APPearance HAZY (A) CLEAR   Specific Gravity, Urine 1.018 1.005 -  1.030   pH 6.0 5.0 - 8.0   Glucose, UA NEGATIVE NEGATIVE mg/dL   Hgb urine dipstick MODERATE (A) NEGATIVE   Bilirubin Urine NEGATIVE NEGATIVE   Ketones, ur NEGATIVE NEGATIVE mg/dL   Protein, ur NEGATIVE NEGATIVE mg/dL   Nitrite NEGATIVE NEGATIVE   Leukocytes,Ua TRACE (A) NEGATIVE   RBC / HPF >50 0 - 5 RBC/hpf   WBC, UA 0-5 0 - 5 WBC/hpf   Bacteria, UA NONE SEEN NONE SEEN   Squamous Epithelial / HPF 0-5 0 - 5 /HPF   Mucus PRESENT    Ca Oxalate Crys, UA PRESENT   CBC     Status: Abnormal   Collection Time: 08/01/22  4:55 PM  Result Value Ref Range   WBC 4.3 4.0 - 10.5 K/uL   RBC 3.75 (L) 3.87 - 5.11 MIL/uL   Hemoglobin 9.4 (L) 12.0 - 15.0 g/dL   HCT 16.1 (L) 09.6 - 04.5 %   MCV 80.5 80.0 - 100.0 fL   MCH 25.1 (L) 26.0 - 34.0 pg   MCHC 31.1 30.0 - 36.0 g/dL   RDW 40.9 81.1 - 91.4 %   Platelets 319 150 - 400 K/uL   nRBC 0.0 0.0 - 0.2 %  hCG, quantitative, pregnancy     Status: Abnormal   Collection Time: 08/01/22  4:55 PM  Result Value Ref Range   hCG, Beta Chain, Quant, S >250,000 (H) <5 mIU/mL  Wet prep, genital     Status: Abnormal   Collection Time: 08/01/22  5:21 PM  Result Value Ref Range   Yeast Wet Prep HPF POC NONE SEEN NONE SEEN   Trich, Wet Prep NONE SEEN NONE SEEN   Clue Cells Wet Prep HPF POC PRESENT (A) NONE SEEN   WBC, Wet Prep HPF POC >=10 (A) <10   Sperm NONE SEEN      US OB Comp Less 14 Wks  Result Date: 08/01/2022 CLINICAL DATA:  Initial evaluation for acute abdominal pain, early pregnancy. EXAM: OBSTETRIC <14 WK ULTRASOUND TECHNIQUE: Transabdominal ultrasound was performed for evaluation of the gestation as well as the maternal uterus and adnexal regions. COMPARISON:  None Available. FINDINGS: Intrauterine gestational sac: Single Yolk sac:  Present Embryo:  Present Cardiac Activity: Present Heart Rate: 180 bpm CRL:   28.4 mm   9 w 4 d  [place] EDC: 03/02/2023 Subchorionic hemorrhage:  None visualized. Maternal uterus/adnexae: Ovaries within normal  limits. No adnexal mass or free fluid. IMPRESSION: 1. Single viable IUP, estimated gestational age [redacted] weeks and 4 days by crown-rump length, with ultrasound EDC of 03/02/2023. No complication. 2. No other acute maternal uterine or adnexal abnormality. Electronically Signed   By: Rise Mu M.D.   On: 08/01/2022 17:51     MDM Labs ordered and reviewed.   UA, UPT CBC, HCG ABO/Rh- O Pos Wet prep and gc/chlamydia US OB Comp Less 14 weeks with Transvaginal  CNM independently reviewed the imaging ordered. Imaging show live IUP consistent with dates  Assessment and Plan   1. Normal intrauterine pregnancy on prenatal ultrasound in first trimester   2. Bacterial vaginosis   3. [redacted] weeks gestation of pregnancy     -Discharge home in stable condition -Rx for metrogel sent to pharmacy -First trimester precautions discussed -Patient advised to follow-up with OB to establish care -Patient may return to MAU as needed or if her condition were to change or worsen  Rolm Bookbinder, CNM 08/01/2022, 4:58 PM

## 2022-08-03 LAB — GC/CHLAMYDIA PROBE AMP (~~LOC~~) NOT AT ARMC
Chlamydia: NEGATIVE
Comment: NEGATIVE
Comment: NORMAL
Neisseria Gonorrhea: NEGATIVE

## 2022-09-03 ENCOUNTER — Ambulatory Visit (INDEPENDENT_AMBULATORY_CARE_PROVIDER_SITE_OTHER): Payer: Medicaid Other | Admitting: Mental Health

## 2022-09-03 DIAGNOSIS — F322 Major depressive disorder, single episode, severe without psychotic features: Secondary | ICD-10-CM
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
## 2022-09-11 ENCOUNTER — Ambulatory Visit (INDEPENDENT_AMBULATORY_CARE_PROVIDER_SITE_OTHER): Payer: Medicaid Other

## 2022-09-11 VITALS — BP 127/75 | HR 93 | Ht 65.0 in | Wt 138.6 lb

## 2022-09-11 DIAGNOSIS — O0990 Supervision of high risk pregnancy, unspecified, unspecified trimester: Secondary | ICD-10-CM | POA: Insufficient documentation

## 2022-09-11 DIAGNOSIS — Z3482 Encounter for supervision of other normal pregnancy, second trimester: Secondary | ICD-10-CM

## 2022-09-11 DIAGNOSIS — Z3402 Encounter for supervision of normal first pregnancy, second trimester: Secondary | ICD-10-CM

## 2022-09-11 DIAGNOSIS — Z3A15 15 weeks gestation of pregnancy: Secondary | ICD-10-CM

## 2022-09-11 MED ORDER — GOJJI WEIGHT SCALE MISC: 1.0000 | 0 refills | Status: DC

## 2022-09-11 NOTE — Progress Notes (Signed)
..  New OB Intake  I connected with Erin French  on 09/11/22 at 10:00 AM EDT in person and verified that I am speaking with the correct person using two identifiers. Nurse is located at [facility] and pt is located at [place].  I explained I am completing New OB Intake today. We discussed EDD of 03/01/2023, by Last Menstrual Period. Pt is [id number]. I reviewed her allergies, medications and Medical/Surgical/OB history.    Patient Active Problem List   Diagnosis Date Noted   Severe major depression, single episode, without psychotic features (HCC) 09/03/2022   Severe sepsis (HCC) 02/22/2021   AKI (acute kidney injury) (HCC) 02/22/2021   Acute encephalopathy 02/22/2021   Hyponatremia 02/22/2021   Elevated transaminase level 02/22/2021   Axillary lymphadenopathy 02/22/2021   LGSIL on Pap smear of cervix 06/17/2019   Fibroid uterus 11/21/2018   S/P primary low transverse C-section 11/01/2018   Malpresentation before onset of labor 11/01/2018    Concerns addressed today  Delivery Plans Plans to deliver at [facility]. Discussed the nature of our practice with multiple providers including residents and students. Due to the size of the practice, the delivering provider may not be the same as those providing prenatal care.   Patient is interested in water birth. Advised patient she will need an appointment with CNM discuss further.  MyChart/Babyscripts MyChart access verified. I explained pt will have some visits in office and some virtually. Babyscripts instructions given and order placed. Patient verifies receipt of registration text/e-mail. Account successfully created and app downloaded.  Blood Pressure Cuff/Weight Scale Patient has a blood pressure cuff at home from last pregnancy. Explained after first prenatal appt pt will check weekly and document in Babyscripts. Patient does not have weight scale; order sent to [facility], patient may track weight weekly in Babyscripts.  Anatomy  US Explained first scheduled [place] will be around 19 weeks. Anatomy US scheduled for:  Is patient a CenteringPregnancy candidate?  Declined Declined due to Declined to say     Is patient a Mom+Baby Combined Care candidate?  Declined     First visit review I reviewed new OB appt with patient. Explained pt will be seen by Mittie Bodo, MD at first visit. Discussed Avelina Laine genetic screening with patient, patient had Horizon collected 06/07/2018. Patient will let us know if patient wants Panorama at [facility] OB visit.Marland Kitchen Routine prenatal lab panel collected, urine, AFP, and GC/C will need to be collected at new ob visit.   Last Pap Diagnosis  Date Value Ref Range Status  06/12/2019 - Low grade squamous intraepithelial lesion (LSIL) (A)  Final    Leola Brazil, CMA 09/11/2022  10:44 AM

## 2022-09-12 LAB — CBC/D/PLT+RPR+RH+ABO+RUBIGG...
Antibody Screen: NEGATIVE
Basophils Absolute: 0 10*3/uL (ref 0.0–0.2)
Basos: 1 %
EOS (ABSOLUTE): 0 10*3/uL (ref 0.0–0.4)
Eos: 1 %
HCV Ab: NONREACTIVE
HIV Screen 4th Generation wRfx: NONREACTIVE
Hematocrit: 35.6 % (ref 34.0–46.6)
Hemoglobin: 10.7 g/dL — ABNORMAL LOW (ref 11.1–15.9)
Hepatitis B Surface Ag: NEGATIVE
Immature Grans (Abs): 0 10*3/uL (ref 0.0–0.1)
Immature Granulocytes: 0 %
Lymphocytes Absolute: 1.3 10*3/uL (ref 0.7–3.1)
Lymphs: 33 %
MCH: 26 pg — ABNORMAL LOW (ref 26.6–33.0)
MCHC: 30.1 g/dL — ABNORMAL LOW (ref 31.5–35.7)
MCV: 87 fL (ref 79–97)
Monocytes Absolute: 0.3 10*3/uL (ref 0.1–0.9)
Monocytes: 8 %
Neutrophils Absolute: 2.3 10*3/uL (ref 1.4–7.0)
Neutrophils: 57 %
Platelets: 352 10*3/uL (ref 150–450)
RBC: 4.11 x10E6/uL (ref 3.77–5.28)
RDW: 13.3 % (ref 11.7–15.4)
RPR Ser Ql: NONREACTIVE
Rh Factor: POSITIVE
Rubella Antibodies, IGG: 12.8 index (ref >0.99)
WBC: 4 10*3/uL (ref 3.4–10.8)

## 2022-09-12 LAB — HCV INTERPRETATION

## 2022-10-15 ENCOUNTER — Ambulatory Visit (INDEPENDENT_AMBULATORY_CARE_PROVIDER_SITE_OTHER): Payer: Medicaid Other | Admitting: Licensed Clinical Social Worker

## 2022-10-15 ENCOUNTER — Encounter: Payer: Self-pay | Admitting: Obstetrics and Gynecology

## 2022-10-15 ENCOUNTER — Ambulatory Visit (INDEPENDENT_AMBULATORY_CARE_PROVIDER_SITE_OTHER): Payer: Medicaid Other | Admitting: Obstetrics and Gynecology

## 2022-10-15 ENCOUNTER — Other Ambulatory Visit (HOSPITAL_COMMUNITY)
Admission: RE | Admit: 2022-10-15 | Payer: Medicaid Other | Source: Ambulatory Visit | Admitting: Obstetrics and Gynecology

## 2022-10-15 VITALS — BP 121/71 | HR 96 | Wt 151.0 lb

## 2022-10-15 DIAGNOSIS — O0990 Supervision of high risk pregnancy, unspecified, unspecified trimester: Secondary | ICD-10-CM | POA: Insufficient documentation

## 2022-10-15 DIAGNOSIS — Z98891 History of uterine scar from previous surgery: Secondary | ICD-10-CM | POA: Insufficient documentation

## 2022-10-15 DIAGNOSIS — N760 Acute vaginitis: Secondary | ICD-10-CM

## 2022-10-15 DIAGNOSIS — O0991 Supervision of high risk pregnancy, unspecified, first trimester: Secondary | ICD-10-CM

## 2022-10-15 DIAGNOSIS — Z3A20 20 weeks gestation of pregnancy: Secondary | ICD-10-CM

## 2022-10-15 DIAGNOSIS — F32A Depression, unspecified: Secondary | ICD-10-CM

## 2022-10-15 DIAGNOSIS — O99342 Other mental disorders complicating pregnancy, second trimester: Secondary | ICD-10-CM

## 2022-10-15 DIAGNOSIS — O0992 Supervision of high risk pregnancy, unspecified, second trimester: Secondary | ICD-10-CM

## 2022-10-15 DIAGNOSIS — Z124 Encounter for screening for malignant neoplasm of cervix: Secondary | ICD-10-CM | POA: Insufficient documentation

## 2022-10-15 DIAGNOSIS — Z1339 Encounter for screening examination for other mental health and behavioral disorders: Secondary | ICD-10-CM | POA: Diagnosis not present

## 2022-10-15 DIAGNOSIS — B9689 Other specified bacterial agents as the cause of diseases classified elsewhere: Secondary | ICD-10-CM

## 2022-10-15 NOTE — Progress Notes (Signed)
INITIAL PRENATAL VISIT NOTE  Subjective:  Erin French is a 27 y.o. [id number] at [redacted]w[redacted]d by LMP c/w 1st trim [place] being seen today for her initial prenatal visit. She has an obstetric history significant for 1 x SVD and 1 x CS for breech. 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  Patient reports  some cramping .  Contractions: Irritability. Vag. Bleeding: None.  Movement: Present. Denies leaking of fluid.    Past Medical History:  Diagnosis Date   Chlamydia 4/12    Past Surgical History:  Procedure Laterality Date   CESAREAN SECTION N/A 11/17/2018   Procedure: CESAREAN SECTION;  Surgeon: [place] Bing, MD;  Location: [facility];  Service: Obstetrics;  Laterality: N/A;   INDUCED ABORTION      OB History  Gravida Para Term Preterm AB Living  6 2 2   3 2   SAB IAB Ectopic Multiple Live Births    1   0 2    # Outcome Date GA Lbr Len/2nd Weight Sex Type Anes PTL Lv  6 Current           5 IAB 2023          4 Term 11/17/18 [redacted]w[redacted]d  6 lb 12 oz (3.062 kg) F CS-Unspec Spinal  LIV  3 Term 04/12/15 [redacted]w[redacted]d / 00:41 6 lb 13.7 oz (3.11 kg) M Vag-Spont None  LIV  2 AB      TAB     1 AB      TAB       Social History   Socioeconomic History   Marital status: Single    Spouse name: Not on file   Number of children: Not on file   Years of education: Not on file   Highest education level: Not on file  Occupational History   Not on file  Tobacco Use   Smoking status: Never   Smokeless tobacco: Never  Vaping Use   Vaping status: Never Used  Substance and Sexual Activity   Alcohol use: Not Currently    Alcohol/week: 0.0 standard drinks of alcohol    Comment: Last drink December 2019   Drug use: No   Sexual activity: Yes    Partners: Male    Birth control/protection: None    Comment: INTERCOURSE AGE 69, SEXUAL PARTNERS LESS THAN 5  Other Topics Concern   Not  on file  Social History Narrative   Not on file   Social Determinants of Health   Financial Resource Strain: At Risk (09/09/2022)   Received from [employer]    Financial Resource Strain: 2  Food Insecurity: Not at Risk (09/09/2022)   Received from [employer]    Food: 1  Recent Concern: Food Insecurity - Food Insecurity Present (09/03/2022)   Hunger Vital Sign    Worried About Running Out of Food in the Last Year: Sometimes true    Ran Out of Food in the Last Year: Sometimes true  Transportation Needs: Not at Risk (09/09/2022)   Received from [employer] Needs    Transportation: 1  Physical Activity: Insufficiently Active (09/03/2022)   Exercise Vital Sign    Days of Exercise per Week: 1 day    Minutes of Exercise per Session: 30 min  Stress: Stress Concern Present (09/03/2022)   [employer] of Occupational Health - Occupational Stress Questionnaire    Feeling  of Stress : To some extent  Social Connections: Moderately Isolated (09/03/2022)   Social Connection and Isolation Panel [NHANES]    Frequency of Communication with Friends and Family: Three times a week    Frequency of Social Gatherings with Friends and Family: Never    Attends Religious Services: More than 4 times per year    Active Member of [employer] or Organizations: No    Attends Banker Meetings: Never    Marital Status: Never married    Family History  Problem Relation Age of Onset   Hypertension Father    Schizophrenia Mother    Hypertension Maternal Grandmother      Current Outpatient Medications:    Misc. Devices (GOJJI WEIGHT SCALE) MISC, 1 Device by Does not apply route once a week., Disp: 1 each, Rfl: 0   Prenatal Vit-Fe Fumarate-FA (PRENATAL MULTIVITAMIN) TABS tablet, Take 1 tablet by mouth daily at 12 noon., Disp: , Rfl:   No Known Allergies  Review of Systems: Negative except for what is mentioned in HPI.  Objective:   Vitals:    10/15/22 1324  BP: 121/71  Pulse: 96  Weight: 151 lb (68.5 kg)    Fetal Status: Fetal Heart Rate (bpm): 155   Movement: Present     Physical Exam: BP 121/71   Pulse 96   Wt 151 lb (68.5 kg)   LMP 05/25/2022   BMI 25.13 kg/m  CONSTITUTIONAL: Well-developed, well-nourished female in no acute distress.  NEUROLOGIC: Alert and oriented to person, place, and time. Normal reflexes, muscle tone coordination. No cranial nerve deficit noted. PSYCHIATRIC: Normal mood and affect. Normal behavior. Normal judgment and thought content. SKIN: Skin is warm and dry. No rash noted. Not diaphoretic. No erythema. No pallor. HENT:  Normocephalic, atraumatic, External right and left ear normal. Oropharynx is clear and moist EYES: Conjunctivae and EOM are normal. Pupils are equal, round, and reactive to light. No scleral icterus.  NECK: Normal range of motion, supple, no masses CARDIOVASCULAR: Normal heart rate noted, regular rhythm RESPIRATORY: Effort and breath sounds normal, no problems with respiration noted BREASTS: deferred ABDOMEN: Soft, nontender, nondistended, gravid. GU: normal appearing external female genitalia, multiparous normal appearing cervix, scant white discharge in vagina, no lesions noted MUSCULOSKELETAL: Normal range of motion. EXT:  No edema and no tenderness. 2+ distal pulses.   Assessment and Plan:  Pregnancy: [id number] at [redacted]w[redacted]d by LMP  1. Supervision of high risk pregnancy, antepartum - Culture, OB Urine - HgB A1c - Cytology - PAP( [place]) - AFP, Serum, Open Spina Bifida - PANORAMA PRENATAL TEST - QuantiFERON-TB Gold Plus - Cervicovaginal ancillary only  2. H/O: C-section desires TOLAC  3. [redacted] weeks gestation of pregnancy  4. Cervical cancer screening H/o hrHPV +LSIL - Cytology - PAP( [place])  Preterm labor symptoms and general obstetric precautions including but not limited to vaginal bleeding, contractions, leaking of fluid and fetal movement were  reviewed in detail with the patient.  Please refer to After Visit Summary for other counseling recommendations.   Return in about 4 weeks (around 11/12/2022) for high OB.  Conan Bowens 10/15/2022 1:55 PM

## 2022-10-15 NOTE — Assessment & Plan Note (Signed)
For breech presentation

## 2022-10-16 LAB — CERVICOVAGINAL ANCILLARY ONLY
Bacterial Vaginitis (gardnerella): POSITIVE — AB
Candida Glabrata: NEGATIVE
Candida Vaginitis: NEGATIVE
Chlamydia: NEGATIVE
Comment: NEGATIVE
Comment: NEGATIVE
Comment: NEGATIVE
Comment: NEGATIVE
Comment: NEGATIVE
Comment: NORMAL
Neisseria Gonorrhea: NEGATIVE
Trichomonas: NEGATIVE

## 2022-10-16 LAB — HEMOGLOBIN A1C
Est. average glucose Bld gHb Est-mCnc: 100 mg/dL
Hgb A1c MFr Bld: 5.1 % (ref 4.8–5.6)

## 2022-10-17 LAB — AFP, SERUM, OPEN SPINA BIFIDA
AFP MoM: 1.64
AFP Value: 108.7 ng/mL
Gest. Age on Collection Date: 20.3 wk
Maternal Age At EDD: 27.2 a
OSBR Risk 1 IN: 3791
Test Results:: NEGATIVE
Weight: 151 [lb_av]

## 2022-10-17 LAB — CULTURE, OB URINE

## 2022-10-17 LAB — URINE CULTURE, OB REFLEX

## 2022-10-18 ENCOUNTER — Other Ambulatory Visit: Payer: Self-pay | Admitting: Obstetrics and Gynecology

## 2022-10-18 DIAGNOSIS — B9689 Other specified bacterial agents as the cause of diseases classified elsewhere: Secondary | ICD-10-CM

## 2022-10-18 LAB — QUANTIFERON-TB GOLD PLUS
QuantiFERON Mitogen Value: 9.86 [IU]/mL
QuantiFERON Nil Value: 0.01 [IU]/mL
QuantiFERON TB1 Ag Value: 0.01 [IU]/mL
QuantiFERON TB2 Ag Value: 0 [IU]/mL
QuantiFERON-TB Gold Plus: NEGATIVE

## 2022-10-18 MED ORDER — METRONIDAZOLE 500 MG PO TABS: 500.0000 mg | ORAL_TABLET | Freq: Two times a day (BID) | ORAL | 0 refills | Status: DC

## 2022-10-18 NOTE — Addendum Note (Signed)
Addended by: Leroy Libman on: 10/18/2022 10:53 AM   Modules accepted: Orders

## 2022-10-20 ENCOUNTER — Other Ambulatory Visit: Payer: Medicaid Other

## 2022-10-20 ENCOUNTER — Ambulatory Visit: Payer: Medicaid Other

## 2022-10-21 LAB — CYTOLOGY - PAP
Comment: NEGATIVE
Diagnosis: NEGATIVE
High risk HPV: NEGATIVE

## 2022-10-21 NOTE — BH Specialist Note (Signed)
Integrated Behavioral Health Initial In-Person Visit  MRN: [id number] Name: Erin French  Number of Integrated Behavioral Health Clinician visits: 1 Session Start time:   130pm  Session End time: 202pm Total time in minutes: 32 mins in person at [facility]  Types of Service: General Behavioral Integrated Care (BHI)  Interpretor:No. Interpretor Name and Language: none   Warm Hand Off Completed.        Subjective: Erin French is a 27 y.o. female accompanied by n/a Patient was referred by Dr. Earlene Plater  for hx of postpartum depression. Patient reports the following symptoms/concerns: depression Duration of problem: over one year; Severity of problem: mild  Objective: Mood: good and Affect: Appropriate Risk of harm to self or others: No plan to harm self or others  Life Context: Family and Social: lives with family in [place] School/Work: n/a Self-Care: none Life Changes: new pregnancy  Patient and/or Family's Strengths/Protective Factors: Concrete supports in place (healthy food, safe environments, etc.)  Goals Addressed: Patient will: Reduce symptoms of: depression Increase knowledge and/or ability of: coping skills  Demonstrate ability to: Increase healthy adjustment to current life circumstances  Progress towards Goals: Ongoing  Interventions: Interventions utilized: Motivational Interviewing and Supportive Counseling  Standardized Assessments completed: Not Needed  Patient and/or Family Response: Erin French reports anxiety and depression. Erin French reports family and father of baby is supportive      Assessment: Patient currently experiencing depression.   Patient may benefit from integrated behavioral health.  Plan: Follow up with behavioral health clinician on : 11/12/2022 Behavioral recommendations: prioritize rest and self care, keep ibh and prenatal appts, communicate needs for added support  Referral(s): Integrated [employer] (In Clinic) "From scale of 1-10, how likely are you to follow plan?":    Gwyndolyn Saxon, LCSW

## 2022-10-22 ENCOUNTER — Encounter (HOSPITAL_COMMUNITY): Payer: Self-pay

## 2022-10-22 ENCOUNTER — Ambulatory Visit (HOSPITAL_COMMUNITY): Payer: Medicaid Other | Admitting: Mental Health

## 2022-10-22 DIAGNOSIS — F322 Major depressive disorder, single episode, severe without psychotic features: Secondary | ICD-10-CM

## 2022-10-22 NOTE — Progress Notes (Signed)
   THERAPIST PROGRESS NOTE  Session Time: 11:05am ( 50 minutes)  Participation Level: Active  Behavioral Response: CasualAlertDysphoric  Type of Therapy: Individual Therapy  Treatment Goals addressed: STG: [name] will increase management of sxs AEB development of x 3 emotional regulation and distress tolerance skills with ability to process events in balanced manner per self report within the next 90 days.   ProgressTowards Goals: Initial  Interventions: CBT and Supportive  Summary: Erin French is a 27 y.o. female who presents with dx of major depression, severe. Presents for session alert and oriented. Mood and affect slightly irritable. Engaged and cooperative. Receptive to interventions. Shares for moods to continue to be low at times and shares concerning difficulties of living at home. Notes concern for children's father to come into the home at times and she is not aware and for father to have allowed this behavior. Shares to not feel supported by family but denies for family to be aware of difficulties that presented with relationship with children's father. Notes for the father of her current child to not be active and may possibly be incarcerated. Shares has been isolating self to not engage with what she reports as toxic family dynamics. Shares for father to want her to engage with mother, whom she describes to be in the 'streets' and shares lacks desire to engage with mother. Notes to feel as if this is forced on her by father with family acting as if nothing is wrong. Notes discord with siblings as well. Shares has explored desire to move to [place] with an associate and processes this with therapist with overall desire to move out of state. Receptive of therapist pointing out need for plan to ensure stability for herself and children if and when she decides to move. Shares desire for work and return to school. Shares may be on housing list here in area. Would like to be able to change her  environment for self and children. Agrees to treatment plan. Denies SI/HI. Initial development of goals.    Suicidal/Homicidal: Nowithout intent/plan  Therapist Response: Therapist engaged Erin French in therapy session. Completed check in and assessed for current level of functioning, sxs management and level of stressors. Reviewed intake assessment, bounds of confidentiality and informed consent. Provided safe space for Erin French to shares thoughts and feelings, provided supportive feedback. Engaged Erin French in processing ways in which her anger and frustration has manifested and explored inability to make others feel how she feels. Encouraged communication with father and siblings and discussed abiity to engage in effective discussions and reviewed factors of communication. Discussed treatment goals of therapy. Reviewed session and provided follow up. No safety concerns reported.   Plan: Return again in  x 5 weeks.  Diagnosis: Severe major depression, single episode, without psychotic features (HCC)  Collaboration of Care: Other None  Patient/Guardian was advised Release of Information must be obtained prior to any record release in order to collaborate their care with an outside provider. Patient/Guardian was advised if they have not already done so to contact the registration department to sign all necessary forms in order for [place] to release information regarding their care.   Consent: Patient/Guardian gives verbal consent for treatment and assignment of benefits for services provided during this visit. Patient/Guardian expressed understanding and agreed to proceed.   Stephan Minister [place], [facility] 10/22/2022

## 2022-10-24 LAB — PANORAMA PRENATAL TEST FULL PANEL:PANORAMA TEST PLUS 5 ADDITIONAL MICRODELETIONS: FETAL FRACTION: 14.2

## 2022-10-30 DIAGNOSIS — D563 Thalassemia minor: Secondary | ICD-10-CM | POA: Insufficient documentation

## 2022-11-03 ENCOUNTER — Ambulatory Visit: Payer: Medicaid Other | Attending: Obstetrics and Gynecology

## 2022-11-03 ENCOUNTER — Ambulatory Visit: Payer: Medicaid Other

## 2022-11-03 ENCOUNTER — Other Ambulatory Visit: Payer: Self-pay | Admitting: *Deleted

## 2022-11-03 ENCOUNTER — Other Ambulatory Visit: Payer: Self-pay | Admitting: Obstetrics and Gynecology

## 2022-11-03 VITALS — BP 134/79 | HR 109

## 2022-11-03 DIAGNOSIS — O283 Abnormal ultrasonic finding on antenatal screening of mother: Secondary | ICD-10-CM | POA: Insufficient documentation

## 2022-11-03 DIAGNOSIS — D563 Thalassemia minor: Secondary | ICD-10-CM | POA: Insufficient documentation

## 2022-11-03 DIAGNOSIS — Z3A23 23 weeks gestation of pregnancy: Secondary | ICD-10-CM | POA: Insufficient documentation

## 2022-11-03 DIAGNOSIS — Z3402 Encounter for supervision of normal first pregnancy, second trimester: Secondary | ICD-10-CM | POA: Diagnosis not present

## 2022-11-03 DIAGNOSIS — O358XX0 Maternal care for other (suspected) fetal abnormality and damage, not applicable or unspecified: Secondary | ICD-10-CM | POA: Insufficient documentation

## 2022-11-03 DIAGNOSIS — O34219 Maternal care for unspecified type scar from previous cesarean delivery: Secondary | ICD-10-CM | POA: Insufficient documentation

## 2022-11-03 DIAGNOSIS — Z363 Encounter for antenatal screening for malformations: Secondary | ICD-10-CM | POA: Diagnosis present

## 2022-11-03 DIAGNOSIS — Z148 Genetic carrier of other disease: Secondary | ICD-10-CM | POA: Diagnosis not present

## 2022-11-03 DIAGNOSIS — Z362 Encounter for other antenatal screening follow-up: Secondary | ICD-10-CM

## 2022-11-04 ENCOUNTER — Encounter: Payer: Self-pay | Admitting: Obstetrics and Gynecology

## 2022-11-04 DIAGNOSIS — O283 Abnormal ultrasonic finding on antenatal screening of mother: Secondary | ICD-10-CM | POA: Insufficient documentation

## 2022-11-04 LAB — CMV ANTIBODY, IGG (EIA): CMV Ab - IgG: >10 U/mL — ABNORMAL HIGH (ref 0.00–0.59)

## 2022-11-04 LAB — INFECT DISEASE AB IGM REFLEX 1

## 2022-11-04 LAB — PARVOVIRUS B19 ANTIBODY, IGG AND IGM
Parvovirus B19 IgG: 0.2 {index} (ref 0.0–0.8)
Parvovirus B19 IgM: 0.2 {index} (ref 0.0–0.8)

## 2022-11-04 LAB — TOXOPLASMA GONDII ANTIBODY, IGM: Toxoplasma Antibody- IgM: <3 [AU]/ml (ref 0.0–7.9)

## 2022-11-04 LAB — TOXOPLASMA GONDII ANTIBODY, IGG: Toxoplasma IgG Ratio: <3 [IU]/mL (ref 0.0–7.1)

## 2022-11-04 LAB — CMV IGM: CMV IgM Ser EIA-aCnc: <30 [AU]/ml (ref 0.0–29.9)

## 2022-11-12 ENCOUNTER — Ambulatory Visit (INDEPENDENT_AMBULATORY_CARE_PROVIDER_SITE_OTHER): Payer: Medicaid Other | Admitting: Obstetrics and Gynecology

## 2022-11-12 VITALS — BP 113/71 | HR 97 | Wt 158.0 lb

## 2022-11-12 DIAGNOSIS — O0990 Supervision of high risk pregnancy, unspecified, unspecified trimester: Secondary | ICD-10-CM

## 2022-11-13 NOTE — Progress Notes (Signed)
Patient left without being seen.

## 2022-11-20 ENCOUNTER — Other Ambulatory Visit: Payer: Self-pay

## 2022-11-20 ENCOUNTER — Ambulatory Visit (INDEPENDENT_AMBULATORY_CARE_PROVIDER_SITE_OTHER): Payer: Medicaid Other | Admitting: Obstetrics and Gynecology

## 2022-11-20 VITALS — BP 121/75 | HR 93 | Wt 158.9 lb

## 2022-11-20 DIAGNOSIS — Z23 Encounter for immunization: Secondary | ICD-10-CM | POA: Diagnosis not present

## 2022-11-20 DIAGNOSIS — O0990 Supervision of high risk pregnancy, unspecified, unspecified trimester: Secondary | ICD-10-CM

## 2022-11-20 DIAGNOSIS — Z3A25 25 weeks gestation of pregnancy: Secondary | ICD-10-CM

## 2022-11-20 DIAGNOSIS — D250 Submucous leiomyoma of uterus: Secondary | ICD-10-CM

## 2022-11-20 DIAGNOSIS — D563 Thalassemia minor: Secondary | ICD-10-CM

## 2022-11-20 DIAGNOSIS — O283 Abnormal ultrasonic finding on antenatal screening of mother: Secondary | ICD-10-CM

## 2022-11-20 DIAGNOSIS — D251 Intramural leiomyoma of uterus: Secondary | ICD-10-CM

## 2022-11-20 DIAGNOSIS — Z98891 History of uterine scar from previous surgery: Secondary | ICD-10-CM

## 2022-11-20 DIAGNOSIS — O0992 Supervision of high risk pregnancy, unspecified, second trimester: Secondary | ICD-10-CM

## 2022-11-20 NOTE — Progress Notes (Signed)
   PRENATAL VISIT NOTE  Subjective:  Erin French is a 27 y.o. [id number] at [redacted]w[redacted]d being seen today for ongoing prenatal care.  She is currently monitored for the following issues for this high-risk pregnancy and has S/P primary low transverse C-section; Fibroid uterus; LGSIL on Pap smear of cervix; Severe major depression, single episode, without psychotic features (HCC); Supervision of high risk pregnancy, antepartum; H/O: C-section; Alpha thalassemia silent carrier; and Echogenic bowel of fetus on prenatal ultrasound on their problem list.  Patient doing well with no acute concerns today. She reports no complaints.  Contractions: Not present. Vag. Bleeding: None.  Movement: Present. Denies leaking of fluid.   The following portions of the patient's history were reviewed and updated as appropriate: allergies, current medications, past family history, past medical history, past social history, past surgical history and problem list. Problem list updated.  Objective:   Vitals:   11/20/22 1041  BP: 121/75  Pulse: 93  Weight: 158 lb 14.4 oz (72.1 kg)    Fetal Status: Fetal Heart Rate (bpm): 155 Fundal Height: 25 cm Movement: Present     General:  Alert, oriented and cooperative. Patient is in no acute distress.  Skin: Skin is warm and dry. No rash noted.   Cardiovascular: Normal heart rate noted  Respiratory: Normal respiratory effort, no problems with respiration noted  Abdomen: Soft, gravid, appropriate for gestational age.  Pain/Pressure: Absent     Pelvic: Cervical exam deferred        Extremities: Normal range of motion.  Edema: None  Mental Status:  Normal mood and affect. Normal behavior. Normal judgment and thought content.   Assessment and Plan:  Pregnancy: [id number] at [redacted]w[redacted]d  1. Supervision of high risk pregnancy, antepartum Continue routine prenatal care  - Flu vaccine trivalent PF, 6mos and older(Flulaval,Afluria,Fluarix,Fluzone)  2. [redacted] weeks gestation of  pregnancy   3. Alpha thalassemia silent carrier   4. Intramural and submucous leiomyoma of uterus   5. Echogenic bowel of fetus on prenatal ultrasound Pt got TORCH titers  6. H/O: C-section Discussed VBAC versus repeat c section, info sheet given, can decide route of delivery next visit  Preterm labor symptoms and general obstetric precautions including but not limited to vaginal bleeding, contractions, leaking of fluid and fetal movement were reviewed in detail with the patient.  Please refer to After Visit Summary for other counseling recommendations.   Return in about 2 weeks (around 12/04/2022) for [facility], in person, 2 hr GTT, 3rd trim labs.   Mariel Aloe, MD Faculty Attending Center for [facility]

## 2022-12-01 ENCOUNTER — Ambulatory Visit: Payer: Medicaid Other | Attending: Maternal & Fetal Medicine

## 2022-12-01 ENCOUNTER — Other Ambulatory Visit: Payer: Self-pay | Admitting: *Deleted

## 2022-12-01 DIAGNOSIS — Z3A27 27 weeks gestation of pregnancy: Secondary | ICD-10-CM

## 2022-12-01 DIAGNOSIS — O35DXX0 Maternal care for other (suspected) fetal abnormality and damage, fetal gastrointestinal anomalies, not applicable or unspecified: Secondary | ICD-10-CM | POA: Diagnosis not present

## 2022-12-01 DIAGNOSIS — O34219 Maternal care for unspecified type scar from previous cesarean delivery: Secondary | ICD-10-CM | POA: Diagnosis not present

## 2022-12-01 DIAGNOSIS — O99012 Anemia complicating pregnancy, second trimester: Secondary | ICD-10-CM

## 2022-12-01 DIAGNOSIS — D563 Thalassemia minor: Secondary | ICD-10-CM

## 2022-12-01 DIAGNOSIS — Z362 Encounter for other antenatal screening follow-up: Secondary | ICD-10-CM | POA: Diagnosis present

## 2022-12-03 ENCOUNTER — Other Ambulatory Visit: Payer: Self-pay

## 2022-12-03 DIAGNOSIS — O0990 Supervision of high risk pregnancy, unspecified, unspecified trimester: Secondary | ICD-10-CM

## 2022-12-07 ENCOUNTER — Other Ambulatory Visit: Payer: Medicaid Other

## 2022-12-07 ENCOUNTER — Encounter: Payer: Medicaid Other | Admitting: Obstetrics and Gynecology

## 2022-12-09 ENCOUNTER — Ambulatory Visit (HOSPITAL_COMMUNITY): Payer: Medicaid Other | Admitting: Mental Health

## 2022-12-18 ENCOUNTER — Encounter: Payer: Self-pay | Admitting: Obstetrics and Gynecology

## 2022-12-18 ENCOUNTER — Other Ambulatory Visit: Payer: Medicaid Other

## 2022-12-18 ENCOUNTER — Encounter: Payer: Medicaid Other | Admitting: Obstetrics and Gynecology

## 2022-12-21 NOTE — Progress Notes (Signed)
Patient did not keep her routine OB and GTT appointment for 12/18/2022.  Cornelia Copa MD Attending Center for [employer] [facility])

## 2023-01-05 ENCOUNTER — Ambulatory Visit: Payer: Medicaid Other | Attending: Maternal & Fetal Medicine

## 2023-01-05 ENCOUNTER — Other Ambulatory Visit: Payer: Self-pay

## 2023-01-05 ENCOUNTER — Other Ambulatory Visit: Payer: Self-pay | Admitting: *Deleted

## 2023-01-05 DIAGNOSIS — O34219 Maternal care for unspecified type scar from previous cesarean delivery: Secondary | ICD-10-CM | POA: Diagnosis not present

## 2023-01-05 DIAGNOSIS — D563 Thalassemia minor: Secondary | ICD-10-CM | POA: Diagnosis not present

## 2023-01-05 DIAGNOSIS — O35DXX0 Maternal care for other (suspected) fetal abnormality and damage, fetal gastrointestinal anomalies, not applicable or unspecified: Secondary | ICD-10-CM | POA: Diagnosis present

## 2023-01-05 DIAGNOSIS — Z3A32 32 weeks gestation of pregnancy: Secondary | ICD-10-CM

## 2023-01-05 DIAGNOSIS — O99013 Anemia complicating pregnancy, third trimester: Secondary | ICD-10-CM | POA: Diagnosis not present

## 2023-01-06 ENCOUNTER — Inpatient Hospital Stay (HOSPITAL_COMMUNITY): Payer: Medicaid Other

## 2023-01-06 ENCOUNTER — Other Ambulatory Visit: Payer: Self-pay

## 2023-01-06 ENCOUNTER — Inpatient Hospital Stay (HOSPITAL_COMMUNITY)
Admission: AD | Admit: 2023-01-06 | Discharge: 2023-01-07 | DRG: 833 | Disposition: A | Discharge status: Other Acute Inpatient Hospital | Payer: Medicaid Other | Attending: Obstetrics and Gynecology | Admitting: Obstetrics and Gynecology

## 2023-01-06 ENCOUNTER — Encounter (HOSPITAL_COMMUNITY): Payer: Self-pay | Admitting: Obstetrics and Gynecology

## 2023-01-06 DIAGNOSIS — O35DXX0 Maternal care for other (suspected) fetal abnormality and damage, fetal gastrointestinal anomalies, not applicable or unspecified: Secondary | ICD-10-CM

## 2023-01-06 DIAGNOSIS — O34219 Maternal care for unspecified type scar from previous cesarean delivery: Secondary | ICD-10-CM | POA: Diagnosis present

## 2023-01-06 DIAGNOSIS — D563 Thalassemia minor: Secondary | ICD-10-CM | POA: Diagnosis not present

## 2023-01-06 DIAGNOSIS — O288 Other abnormal findings on antenatal screening of mother: Secondary | ICD-10-CM | POA: Diagnosis not present

## 2023-01-06 DIAGNOSIS — O368130 Decreased fetal movements, third trimester, not applicable or unspecified: Secondary | ICD-10-CM

## 2023-01-06 DIAGNOSIS — O99013 Anemia complicating pregnancy, third trimester: Secondary | ICD-10-CM | POA: Diagnosis not present

## 2023-01-06 DIAGNOSIS — Z3A32 32 weeks gestation of pregnancy: Secondary | ICD-10-CM

## 2023-01-06 DIAGNOSIS — Z8249 Family history of ischemic heart disease and other diseases of the circulatory system: Secondary | ICD-10-CM

## 2023-01-06 DIAGNOSIS — Z818 Family history of other mental and behavioral disorders: Secondary | ICD-10-CM

## 2023-01-06 DIAGNOSIS — O403XX0 Polyhydramnios, third trimester, not applicable or unspecified: Secondary | ICD-10-CM | POA: Diagnosis present

## 2023-01-06 DIAGNOSIS — Z148 Genetic carrier of other disease: Secondary | ICD-10-CM | POA: Diagnosis not present

## 2023-01-06 LAB — TYPE AND SCREEN
ABO/RH(D): O POS
Antibody Screen: NEGATIVE

## 2023-01-06 LAB — RAPID HIV SCREEN (HIV 1/2 AB+AG)
HIV 1/2 Antibodies: NONREACTIVE
HIV-1 P24 Antigen - HIV24: NONREACTIVE

## 2023-01-06 LAB — CBC
HCT: 32.8 % — ABNORMAL LOW (ref 36.0–46.0)
Hemoglobin: 10.4 g/dL — ABNORMAL LOW (ref 12.0–15.0)
MCH: 27.4 pg (ref 26.0–34.0)
MCHC: 31.7 g/dL (ref 30.0–36.0)
MCV: 86.5 fL (ref 80.0–100.0)
Platelets: 349 10*3/uL (ref 150–400)
RBC: 3.79 MIL/uL — ABNORMAL LOW (ref 3.87–5.11)
RDW: 13 % (ref 11.5–15.5)
WBC: 5.5 10*3/uL (ref 4.0–10.5)
nRBC: 0 % (ref 0.0–0.2)

## 2023-01-06 LAB — RPR: RPR Ser Ql: NONREACTIVE

## 2023-01-06 MED ORDER — DOCUSATE SODIUM 100 MG PO CAPS
100.0000 mg | ORAL_CAPSULE | Freq: Every day | ORAL | Status: DC
  Administered 2023-01-07: 100 mg via ORAL
  Filled 2023-01-06: qty 1

## 2023-01-06 MED ORDER — LACTATED RINGERS IV SOLN: 125.0000 mL/h | INTRAVENOUS | Status: AC

## 2023-01-06 MED ORDER — PRENATAL MULTIVITAMIN CH
1.0000 | ORAL_TABLET | Freq: Every day | ORAL | Status: DC
  Administered 2023-01-07: 1 via ORAL
  Filled 2023-01-06: qty 1

## 2023-01-06 MED ORDER — BETAMETHASONE SOD PHOS & ACET 6 (3-3) MG/ML IJ SUSP
12.0000 mg | INTRAMUSCULAR | Status: AC
  Administered 2023-01-06 – 2023-01-07 (×2): 12 mg via INTRAMUSCULAR
  Filled 2023-01-06: qty 5

## 2023-01-06 MED ORDER — ACETAMINOPHEN 325 MG PO TABS
650.0000 mg | ORAL_TABLET | ORAL | Status: DC | PRN
  Administered 2023-01-06 – 2023-01-07 (×3): 650 mg via ORAL
  Filled 2023-01-06 (×3): qty 2

## 2023-01-06 MED ORDER — CALCIUM CARBONATE ANTACID 500 MG PO CHEW: 2.0000 | CHEWABLE_TABLET | ORAL | Status: DC | PRN

## 2023-01-06 MED ORDER — BETAMETHASONE SOD PHOS & ACET 6 (3-3) MG/ML IJ SUSP
12.0000 mg | Freq: Once | INTRAMUSCULAR | Status: DC
  Filled 2023-01-06: qty 5

## 2023-01-06 NOTE — H&P (Signed)
History   Angelyse Memmott , a  27 y.o. [id number] at 107w2d presents to MAU with concerns for DFM since yesterday morning. Patient reports after her [place] appointment yesterday morning that she noticed her baby not moving as much. She reports sitting for several minutes, drinking a sugary drinking and still no movement throughout the day and night. Patient also reports having a full breakfast this morning prior to arrival. She reports no movement here in triage. She denies VB, LOF or contractions.    CSN: [id number]  Arrival date and time: 01/06/23 2952   Event Date/Time   First Provider Initiated Contact with Patient 01/06/23 [id number]      Chief Complaint  Patient presents with   Decreased Fetal Movement   HPI  OB History     Gravida  6   Para  2   Term  2   Preterm      AB  3   Living  2      SAB      IAB  1   Ectopic      Multiple  0   Live Births  2           Past Medical History:  Diagnosis Date   Chlamydia 05/2010   Fibroid uterus 11/21/2018   See c-section op note         Past Surgical History:  Procedure Laterality Date   CESAREAN SECTION N/A 11/17/2018   Procedure: CESAREAN SECTION;  Surgeon: Alder Bing, MD;  Location: [facility] LD [facility]S;  Service: Obstetrics;  Laterality: N/A;   INDUCED ABORTION      Family History  Problem Relation Age of Onset   Hypertension Father    Schizophrenia Mother    Hypertension Maternal Grandmother     Social History   Tobacco Use   Smoking status: Never   Smokeless tobacco: Never  Vaping Use   Vaping status: Never Used  Substance Use Topics   Alcohol use: Not Currently    Alcohol/week: 0.0 standard drinks of alcohol    Comment: Last drink December 2019   Drug use: No    Allergies: No Known Allergies  Medications Prior to Admission  Medication Sig Dispense Refill Last Dose   Misc. Devices (GOJJI WEIGHT SCALE) MISC 1 Device by Does not apply route once a week. 1 each 0    Prenatal Vit-Fe Fumarate-FA  (PRENATAL MULTIVITAMIN) TABS tablet Take 1 tablet by mouth daily at 12 noon.       Review of Systems  Gastrointestinal:  Negative for abdominal pain.  Genitourinary:  Negative for vaginal bleeding and vaginal discharge.   Physical Exam   Blood pressure 125/70, pulse (!) 113, temperature 98.5 F (36.9 C), temperature source Oral, resp. rate 18, last menstrual period 05/25/2022, SpO2 99%.  Physical Exam Constitutional:      General: She is not in acute distress.    Appearance: She is not ill-appearing.  Pulmonary:     Effort: Pulmonary effort is normal.  Abdominal:     Tenderness: There is no abdominal tenderness.     Comments: Fetal movement: Light nudges noted on palpation.   Skin:    General: Skin is warm and dry.  Psychiatric:        Mood and Affect: Mood normal.        Behavior: Behavior normal.    FHT: 150 minimal variability. 10x10 x1 in MAU. 1 variable decels noted upon arrival that resolved with position changes.  Toco: UI with occasional  contractions, patient unaware.   MAU Course  Procedures Orders Placed This Encounter  Procedures   [place] MFM FETAL BPP WO NON STRESS   Glucose tolerance test nursing procedure:   When prompted by BPA, nurse to place lab orders for serum glucose to be drawn at 1 hour post consumption.    MDM Decreased fetal movement -Category II tracing: minimal variability, negative 10x10 accel, negative decels. Patient sent for BPP for NReactive NST. Only 2 movements noted on fetal clicker.  -BPP 4/10 - Dr. Judeth Cornfield called MAU to discuss BPP and patient presentation.  -Dr. Berton Lan notified of BPP 4/8. Off for breathing and tone.  - MD at bedside to discussion admission for observation and repeat BPP in 4 hours.  Assessment and Plan  Decreased fetal movement in third trimester Plan to Admit to [facility] with BMZ and observation.    Hermann Dottavio Danella Deis) Suzie Portela, MSN, CNM  [facility]  01/06/2023 11:26 AM

## 2023-01-06 NOTE — MAU Note (Signed)
.  Erin French is a 27 y.o. at [redacted]w[redacted]d here in MAU reporting: DFM since yesterday. She had an [place] yesterday morning and hadn't felt any movement since then. Denies VB or LOF. On-going round ligament pain, but denies any other acute complaints.   LMP: N/A Onset of complaint: Yesterday Pain score: 7/10 Vitals:   01/06/23 0826  BP: 128/76  Pulse: (!) 123  Resp: 18  Temp: 98.5 F (36.9 C)  SpO2: 99%     FHT:150 Lab orders placed from triage:  N/A

## 2023-01-06 NOTE — MAU Provider Note (Addendum)
History   Erin French , a  27 y.o. [id number] at 107w2d presents to MAU with concerns for DFM since yesterday morning. Patient reports after her [place] appointment yesterday morning that she noticed her baby not moving as much. She reports sitting for several minutes, drinking a sugary drinking and still no movement throughout the day and night. Patient also reports having a full breakfast this morning prior to arrival. She reports no movement here in triage. She denies VB, LOF or contractions.    CSN: [id number]  Arrival date and time: 01/06/23 2952   Event Date/Time   First Provider Initiated Contact with Patient 01/06/23 [id number]      Chief Complaint  Patient presents with   Decreased Fetal Movement   HPI  OB History     Gravida  6   Para  2   Term  2   Preterm      AB  3   Living  2      SAB      IAB  1   Ectopic      Multiple  0   Live Births  2           Past Medical History:  Diagnosis Date   Chlamydia 05/2010   Fibroid uterus 11/21/2018   See c-section op note         Past Surgical History:  Procedure Laterality Date   CESAREAN SECTION N/A 11/17/2018   Procedure: CESAREAN SECTION;  Surgeon: Alder Bing, MD;  Location: [facility] LD [facility];  Service: Obstetrics;  Laterality: N/A;   INDUCED ABORTION      Family History  Problem Relation Age of Onset   Hypertension Father    Schizophrenia Mother    Hypertension Maternal Grandmother     Social History   Tobacco Use   Smoking status: Never   Smokeless tobacco: Never  Vaping Use   Vaping status: Never Used  Substance Use Topics   Alcohol use: Not Currently    Alcohol/week: 0.0 standard drinks of alcohol    Comment: Last drink December 2019   Drug use: No    Allergies: No Known Allergies  Medications Prior to Admission  Medication Sig Dispense Refill Last Dose   Misc. Devices (GOJJI WEIGHT SCALE) MISC 1 Device by Does not apply route once a week. 1 each 0    Prenatal Vit-Fe Fumarate-FA  (PRENATAL MULTIVITAMIN) TABS tablet Take 1 tablet by mouth daily at 12 noon.       Review of Systems  Gastrointestinal:  Negative for abdominal pain.  Genitourinary:  Negative for vaginal bleeding and vaginal discharge.   Physical Exam   Blood pressure 125/70, pulse (!) 113, temperature 98.5 F (36.9 C), temperature source Oral, resp. rate 18, last menstrual period 05/25/2022, SpO2 99%.  Physical Exam Constitutional:      General: She is not in acute distress.    Appearance: She is not ill-appearing.  Pulmonary:     Effort: Pulmonary effort is normal.  Abdominal:     Tenderness: There is no abdominal tenderness.     Comments: Fetal movement: Light nudges noted on palpation.   Skin:    General: Skin is warm and dry.  Psychiatric:        Mood and Affect: Mood normal.        Behavior: Behavior normal.    FHT: 150 minimal variability. 10x10 x1 in MAU. 1 variable decels noted upon arrival that resolved with position changes.  Toco: UI with occasional  contractions, patient unaware.   MAU Course  Procedures Orders Placed This Encounter  Procedures   [place] MFM FETAL BPP WO NON STRESS   Glucose tolerance test nursing procedure:   When prompted by BPA, nurse to place lab orders for serum glucose to be drawn at 1 hour post consumption.    MDM Decreased fetal movement -Category II tracing: minimal variability, negative 10x10 accel, negative decels. Patient sent for BPP for NReactive NST. Only 2 movements noted on fetal clicker.  -BPP 4/10 - Dr. Judeth Cornfield called MAU to discuss BPP and patient presentation.  -Dr. Berton Lan notified of BPP 4/8. Off for breathing and tone.  - MD at bedside to discussion admission for observation and repeat BPP in 4 hours.  Assessment and Plan  Decreased fetal movement in third trimester Plan to Admit to [facility] with BMZ and observation.    Hermann Dottavio Danella Deis) Suzie Portela, MSN, CNM  [facility]  01/06/2023 11:26 AM

## 2023-01-07 ENCOUNTER — Inpatient Hospital Stay (HOSPITAL_COMMUNITY): Payer: Medicaid Other

## 2023-01-07 DIAGNOSIS — O368130 Decreased fetal movements, third trimester, not applicable or unspecified: Secondary | ICD-10-CM

## 2023-01-07 DIAGNOSIS — Z3A32 32 weeks gestation of pregnancy: Secondary | ICD-10-CM

## 2023-01-07 DIAGNOSIS — O288 Other abnormal findings on antenatal screening of mother: Secondary | ICD-10-CM | POA: Diagnosis not present

## 2023-01-07 LAB — GLUCOSE, 1 HOUR GESTATIONAL: Glucose Tolerance, 1 hour: 189 mg/dL

## 2023-01-07 MED ORDER — LACTATED RINGERS IV BOLUS
500.0000 mL | Freq: Once | INTRAVENOUS | Status: AC
  Administered 2023-01-07: 500 mL via INTRAVENOUS

## 2023-01-07 NOTE — Progress Notes (Signed)
Patient ID: Erin French, female   DOB: 1995-08-13, 27 y.o.   MRN: [id number] FACULTY PRACTICE ANTEPARTUM(COMPREHENSIVE) NOTE  Erin French is a 27 y.o. [id number] with Estimated Date of Delivery: 03/01/23   By  sonogram [redacted]w[redacted]d  who is admitted for decreased fetal movement.    Fetal presentation is cephalic. Length of Stay:  1  Days  Date of admission:01/06/2023  Subjective: Pt feeling more movement and NST reactive overnight Patient reports the fetal movement as active. Patient reports uterine contraction  activity as none. Patient reports  vaginal bleeding as none. Patient describes fluid per vagina as None.  Vitals:  Blood pressure 122/70, pulse 96, temperature 97.8 F (36.6 C), temperature source Oral, resp. rate 16, weight 78 kg, last menstrual period 05/25/2022, SpO2 97%. Vitals:   01/06/23 1534 01/06/23 2052 01/06/23 2345 01/07/23 0352  BP: 103/67 117/68 128/75 122/70  Pulse: (!) 114 (!) 112 97 96  Resp: 18 20 18 16   Temp: 98.6 F (37 C) 97.9 F (36.6 C) 97.9 F (36.6 C) 97.8 F (36.6 C)  TempSrc: Oral Oral Oral Oral  SpO2: 98% 97% 98% 97%  Weight:   78 kg    Physical Examination:  General appearance - alert, well appearing, and in no distress Abdomen - soft, nontender, nondistended, no masses or organomegaly Fundal Height:  size equals dates Pelvic Exam:  examination not indicated Cervical Exam: Not evaluated.  Extremities: extremities normal, atraumatic, no cyanosis or edema with DTRs 2+ bilaterally Membranes:intact  Fetal Monitoring:  Baseline: 140s bpm, Variability: Good {> 6 bpm), Accelerations: Reactive, and Decelerations: Absent   reactive  Labs:  Results for orders placed or performed during the hospital encounter of 01/06/23 (from the past 24 hour(s))  Type and screen [facility]   Collection Time: 01/06/23 11:36 AM  Result Value Ref Range   ABO/RH(D) O POS    Antibody Screen NEG    Sample Expiration      01/09/2023,2359 Performed at  [facility], [address], [place], [place] [address]   RPR   Collection Time: 01/06/23 11:37 AM  Result Value Ref Range   RPR Ser Ql NON REACTIVE NON REACTIVE  CBC   Collection Time: 01/06/23 11:37 AM  Result Value Ref Range   WBC 5.5 4.0 - 10.5 K/uL   RBC 3.79 (L) 3.87 - 5.11 MIL/uL   Hemoglobin 10.4 (L) 12.0 - 15.0 g/dL   HCT 47.4 (L) 25.9 - 56.3 %   MCV 86.5 80.0 - 100.0 fL   MCH 27.4 26.0 - 34.0 pg   MCHC 31.7 30.0 - 36.0 g/dL   RDW 87.5 64.3 - 32.9 %   Platelets 349 150 - 400 K/uL   nRBC 0.0 0.0 - 0.2 %  Rapid HIV screen (HIV 1/2 Ab+Ag)   Collection Time: 01/06/23 11:38 AM  Result Value Ref Range   HIV-1 P24 Antigen - HIV24 NON REACTIVE NON REACTIVE   HIV 1/2 Antibodies NON REACTIVE NON REACTIVE   Interpretation (HIV Ag Ab)      A non reactive test result means that HIV 1 or HIV 2 antibodies and HIV 1 p24 antigen were not detected in the specimen.    Imaging Studies:    [place] MFM FETAL BPP WO NON STRESS  Result Date: 01/06/2023 ----------------------------------------------------------------------  OBSTETRICS REPORT                       (Signed Final 01/06/2023 11:02 am) ---------------------------------------------------------------------- Patient Info  ID #:  [id number]                          D.O.B.:  [birth year] (27 yrs)  Name:       [facility]                  Visit Date: 01/06/2023 09:44 am ---------------------------------------------------------------------- Performed By  Attending:        Noralee Space MD        Ref. Address:     [address]                                                             [address]                                                             [place]                                                             [address]  Performed By:     Percell Boston          Secondary Phy.:   [facility] MAU/Triage                    RDMS  Referred By:      [facility]              [facility] ---------------------------------------------------------------------- Orders  #  Description                           Code        Ordered By  1  [place] MFM FETAL BPP WO NON               59563.87    SHANTONETTE     STRESS                                            PAYNE ----------------------------------------------------------------------  #  Order #                     Accession #                Episode #  1  [id number]                   [id number]                 [id number] ---------------------------------------------------------------------- Indications  Decreased fetal movement                       O36.8190  [redacted] weeks gestation of pregnancy                Z3A.32  Genetic carrier (silent carrier alpha thal)    Z14.8  Echogenic bowel                                O35.8XX0 028.3  Previous cesarean delivery, antepartum         O34.219  LR female, neg AFP ---------------------------------------------------------------------- Fetal Evaluation  Num Of Fetuses:         1  Fetal Heart Rate(bpm):  153  Cardiac Activity:       Observed  Presentation:           Cephalic  Placenta:               Posterior  P. Cord Insertion:      Previously seen  Amniotic Fluid  AFI FV:      Within normal limits  AFI Sum(cm)     %Tile       Largest Pocket(cm)  19.5            73          6.1  RUQ(cm)       RLQ(cm)       LUQ(cm)        LLQ(cm)  6.1           4.5           5              3.9 ---------------------------------------------------------------------- Biophysical Evaluation  Amniotic F.V:   Pocket => 2 cm             F. Tone:        Observed  F. Movement:    Not Observed               Score:          4/8  F. Breathing:   Not Observed ---------------------------------------------------------------------- OB History  Blood Type:   O+  Gravidity:    6         Term:   2         SAB:   3  Living:       2  ---------------------------------------------------------------------- Gestational Age  LMP:           32w 2d        Date:  05/25/22                   EDD:   03/01/23  Best:          Armida Sans 2d     Det. By:  LMP  (05/25/22)          EDD:   03/01/23 ---------------------------------------------------------------------- Anatomy  Cranium:  Appears normal         Stomach:                Appears normal, left                                                                        sided  Ventricles:            Appears normal         Kidneys:                Appear normal  Thoracic:              Appears normal         Bladder:                Appears normal  Diaphragm:             Appears normal ---------------------------------------------------------------------- Cervix Uterus Adnexa  Cervix  Not visualized (advanced GA >24wks)  Uterus  No abnormality visualized.  Right Ovary  Not visualized.  Left Ovary  Not visualized.  Cul De Sac  No free fluid seen.  Adnexa  No abnormality visualized ---------------------------------------------------------------------- Impression  Patient is being evaluated at the MAU for complaints of  decreased fetal movements.  On ultrasound performed  yesterday, the fetal growth was appropriate for gestational  age.  At prenatal ultrasound, echogenic bowel was seen.  Subsequently, infectious serologies including  cytomegalovirus and toxoplasmosis were negative.  On today's ultrasound, amniotic fluid is normal.  Fetal  movements and fetal breathing movements did not meet the  criteria of BPP.  Echogenic bowel is seen again.  Minimal  ascites is seen. BPP 4/8.  I discussed the findings with Dr. Berton Lan. NST is not reactive.  Agree with her plan of antenatal corticosteroids and  continuous monitoring. ---------------------------------------------------------------------- Recommendations  -MCA Doppler and repeat BPP in the evening.  -If NST shows decelerations, proceed to delivery.  ----------------------------------------------------------------------                  Noralee Space, MD Electronically Signed Final Report   01/06/2023 11:02 am ----------------------------------------------------------------------   [place] MFM OB FOLLOW UP  Result Date: 01/05/2023 ----------------------------------------------------------------------  OBSTETRICS REPORT                       (Signed Final 01/05/2023 09:07 am) ---------------------------------------------------------------------- Patient Info  ID #:       [id number]                          D.O.B.:  March 29, 1995 (27 yrs)  Name:       [facility]                  Visit Date: 01/05/2023 07:40 am ---------------------------------------------------------------------- Performed By  Attending:        Braxton Feathers DO       Ref. Address:     [address]  [address]                                                             [address]                                                             [place]                                                             [address]  Performed By:     Anabel Halon          [facility]  Referred By:      [facility] ---------------------------------------------------------------------- Orders  #  Description                           Code        Ordered By  1  [place] MFM OB FOLLOW UP                   30865.78    Braxton Feathers ----------------------------------------------------------------------  #  Order #                     Accession #                Episode #  1  [id number]                   [id number]                 [id number] ---------------------------------------------------------------------- Indications  Genetic carrier  (silent carrier alpha thal)    Z14.8  Echogenic bowel  O35.8XX0 028.3  [redacted] weeks gestation of pregnancy                Z3A.32  Encounter for other antenatal screening        Z36.2  follow-up  Previous cesarean delivery, antepartum         O34.219  LR female, neg AFP ---------------------------------------------------------------------- Fetal Evaluation  Num Of Fetuses:         1  Fetal Heart Rate(bpm):  155  Cardiac Activity:       Observed  Presentation:           Cephalic  Placenta:               Posterior  P. Cord Insertion:      Previously seen  Amniotic Fluid  AFI FV:      Within normal limits  AFI Sum(cm)     %Tile       Largest Pocket(cm)  22.68           88          7.04  RUQ(cm)       RLQ(cm)       LUQ(cm)        LLQ(cm)  4.97          5.05          7.04           5.62 ---------------------------------------------------------------------- Biometry  BPD:      83.6  mm     G. Age:  33w 5d         83  %    CI:        81.75   %    70 - 86                                                          FL/HC:      20.9   %    19.1 - 21.3  HC:      291.8  mm     G. Age:  32w 1d         15  %    HC/AC:      1.03        0.96 - 1.17  AC:      284.5  mm     G. Age:  32w 3d         59  %    FL/BPD:     73.1   %    71 - 87  FL:       61.1  mm     G. Age:  31w 5d         26  %    FL/AC:      21.5   %    20 - 24  Est. FW:    1949  gm      4 lb 5 oz     45  % ---------------------------------------------------------------------- OB History  Blood Type:   O+  Gravidity:    6         Term:   2         SAB:   3  Living:       2 ---------------------------------------------------------------------- Gestational Age  LMP:  32w 1d        Date:  05/25/22                   EDD:   03/01/23  U/S Today:     32w 4d                                        EDD:   02/26/23  Best:          32w 1d     Det. By:  LMP  (05/25/22)          EDD:   03/01/23  ---------------------------------------------------------------------- Targeted Anatomy  Central Nervous System  Calvarium/Cranial V.:  Appears normal         Cereb./Vermis:          Previously seen  Cavum:                 Previously seen        [employer]:         Previously seen  Lateral Ventricles:    Previously seen        Midline Falx:           Previously seen  Choroid Plexus:        Previously seen  Spine  Cervical:              Previously seen        Sacral:                 Previously seen  Thoracic:              Previously seen        Shape/Curvature:        Previously seen  Lumbar:                Previously seen  Head/Neck  Lips:                  Previously seen        Profile:                Previously seen  Neck:                  Previously seen        Orbits/Eyes:            Previously seen  Nuchal Fold:           Previously seen        Mandible:               Previously seen  Nasal Bone:            Previously seen        Maxilla:                Previously seen  Thorax  4 Chamber View:        Previously seen        Interventr. Septum:     Previously seen  Cardiac Activity:      Observed               Cardiac Axis:           Previously een  Cardiac Situs:         Previously seen        Diaphragm:  Appears normal  Rt Outflow Tract:      Previously seen        3 Vessel View:          Previously seen  Lt Outflow Tract:      Previously seen        3 V Trachea View:       Previously seen  Aortic Arch:           Previously seen        IVC:                    Previously seen  Ductal Arch:           Previously seen        Crossing:               Previously seen  SVC:                   Previously seen  Abdomen  Ventral Wall:          Previously seen        Lt Kidney:              Appears normal  Cord Insertion:        Previously seen        Rt Kidney:              Appears normal  Situs:                 Previously seen        Bladder:                Appears normal  Stomach:               Appears  normal         Bowel:                  Echogenic bowel  Extremities  Lt Humerus:            Previously seen        Lt Femur:               Previously seen  Rt Humerus:            Previously seen        Rt Femur:               Previously seen  Lt Forearm:            Previously seen        Lt Lower Leg:           Previously seen  Rt Forearm:            Previously seen        Rt Lower Leg:           Previously seen  Lt Hand:               Previously seen        Lt Foot:                Previously seen  Rt Hand:               Previously seen        Rt Foot:                Previously seen  Other  Umbilical Cord:        Previously  seen        Genitalia:              Previously seen ---------------------------------------------------------------------- Cervix Uterus Adnexa  Cervix  Not visualized (advanced GA >24wks) ---------------------------------------------------------------------- Comments  The patient is here for ultrasound at 32w 1d. EDD:  03/01/2023 dated by LMP  (05/25/22).  Her pregnancy is  complicated by echogenic bowel She has no further concerns  today.  Sonographic findings  Single intrauterine pregnancy.  Fetal cardiac activity:  Observed and appears normal.  Presentation: Cephalic.  Interval fetal anatomy appears normal except for mild  echogenic bowel. There is no dilation or other abnormalities.  Fetal biometry shows the estimated fetal weight at the 45  percentile.  Amniotic fluid volume: Within normal limits. MVP: 7.04 cm.  Placenta: Posterior.  Recommendations  1. Serial growth ultrasounds every 4-6 weeks until delivery  2. Declines CF testing or amnio  3. Toxo/parvo neg ----------------------------------------------------------------------                   Braxton Feathers, DO Electronically Signed Final Report   01/05/2023 09:07 am ----------------------------------------------------------------------     Medications:  Scheduled  betamethasone acetate-betamethasone sodium phosphate  12 mg  Intramuscular Q24 Hr x 2   docusate sodium  100 mg Oral Daily   prenatal multivitamin  1 tablet Oral Q1200   I have reviewed the patient's current medications.  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

## 2023-01-07 NOTE — Plan of Care (Signed)
  Problem: Education: Goal: Knowledge of disease or condition will improve Outcome: Completed/Met Goal: Knowledge of the prescribed therapeutic regimen will improve Outcome: Completed/Met Goal: Individualized Educational Video(s) Outcome: Completed/Met   Problem: Clinical Measurements: Goal: Complications related to the disease process, condition or treatment will be avoided or minimized Outcome: Completed/Met   Problem: Education: Goal: Knowledge of General Education information will improve Description: Including pain rating scale, medication(s)/side effects and non-pharmacologic comfort measures Outcome: Completed/Met   Problem: Health Behavior/Discharge Planning: Goal: Ability to manage health-related needs will improve Outcome: Completed/Met   Problem: Clinical Measurements: Goal: Ability to maintain clinical measurements within normal limits will improve Outcome: Completed/Met Goal: Will remain free from infection Outcome: Completed/Met Goal: Diagnostic test results will improve Outcome: Completed/Met Goal: Respiratory complications will improve Outcome: Completed/Met Goal: Cardiovascular complication will be avoided Outcome: Completed/Met   Problem: Activity: Goal: Risk for activity intolerance will decrease Outcome: Completed/Met   Problem: Nutrition: Goal: Adequate nutrition will be maintained Outcome: Completed/Met   Problem: Coping: Goal: Level of anxiety will decrease Outcome: Completed/Met   Problem: Elimination: Goal: Will not experience complications related to bowel motility Outcome: Completed/Met Goal: Will not experience complications related to urinary retention Outcome: Completed/Met   Problem: Pain Management: Goal: General experience of comfort will improve Outcome: Completed/Met   Problem: Safety: Goal: Ability to remain free from injury will improve Outcome: Completed/Met   Problem: Skin Integrity: Goal: Risk for impaired skin  integrity will decrease Outcome: Completed/Met

## 2023-01-07 NOTE — Discharge Summary (Addendum)
Physician Discharge Summary  Patient ID: Erin [facility] MRN: [id number] DOB/AGE: 1995-07-16 27 y.o. OB Clinic: [facility]  Admit date: 01/06/2023 Discharge date: 01/07/2023  Admission Diagnoses: Pregnancy at 32/2. Decreased fetal movement. BPP 4/10 (no movement or breathing noted, non reactive NST). 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 History of 2020 PLTCS for breech.   Discharge Diagnoses: Pregnancy at 32/3. Abnormal fetal ultrasound.   Discharged Condition: good  Hospital Course: Patient admitted 11/13 with CC of decreased fetal movement. NST non reactive so BPP done which was 4/8 for no breathing or movement noted, AFI 19.5, echogenic bowel still seen with minimal ascites.. Patient admitted and kept on continuous monitoring, BMZ #1 given at 1138am and repeat BPP done overnight which was six hours later and showed bpp 6/10 for no sustained breathing with still normal afi of 20.8 and this time MCA was done which was <1 MoM. On 11/14, she had another BPP that was 8/8, cephalic, afi 21.9, MCA still normal at 1.31 but now with moderate ascites and dilated bowel loops (see below). MFM Dr. Judeth Cornfield felt transfer to [facility] for further evaluation was best for the patient so she was transferred.  BMZ#2 given on 11/14 at noon  RPR negative     Latest Ref Rng & Units 01/06/2023   11:37 AM 09/11/2022   11:31 AM 08/01/2022    4:55 PM  CBC  WBC 4.0 - 10.5 K/uL 5.5  4.0  4.3   Hemoglobin 12.0 - 15.0 g/dL 47.4  25.9  9.4   Hematocrit 36.0 - 46.0 % 32.8  35.6  30.2   Platelets 150 - 400 K/uL 349  352  319    O POS   Narrative & Impression    ----------------------------------------------------------------------  OBSTETRICS REPORT                    (Corrected Final 01/07/2023 11:26 am) ---------------------------------------------------------------------- Patient Info    ID #:       [id number]                           D.O.B.:  02-Oct-1995 (27 yrs)  Name:       Erin French                  Visit Date: 01/07/2023 09:31 am ---------------------------------------------------------------------- Performed By    Attending:        Noralee Space MD        Ref. Address:     [address]                                                             [phone]  [place]                                                             [address]  Performed By:     Marcellina Millin          Secondary Phy.:   [facility]                    [facility]                                                             Care  Referred By:      [facility]             [facility] ---------------------------------------------------------------------- Orders    #  Description                           Code        Ordered By  1  [place] MFM FETAL BPP WO NON               76819.01    LUTHER EURE     STRESS  2  [place] MFM MCA DOPPLER                    76821.01    LUTHER EURE ----------------------------------------------------------------------    #  Order #                     Accession #                Episode #  1  [id number]                   [id number]                 [id number]  2  [id number]                   [id number]                 [id number] ---------------------------------------------------------------------- Indications    Decreased fetal movement                       O36.8190  [redacted] weeks gestation of pregnancy                Z3A.32 ---------------------------------------------------------------------- Fetal Evaluation    Num Of Fetuses:         1  Fetal Heart Rate(bpm):  149  Cardiac Activity:       Observed  Presentation:  Cephalic  Placenta:               Posterior    Amniotic Fluid  AFI FV:       Subjectively upper-normal    AFI Sum(cm)     %Tile       Largest Pocket(cm)  21.9            84          9.1    RUQ(cm)       RLQ(cm)       LUQ(cm)        LLQ(cm)  9.1           5.6           3              4.2 ---------------------------------------------------------------------- Biophysical Evaluation    Amniotic F.V:   Within normal limits       F. Tone:        Observed  F. Movement:    Observed                   Score:          8/8  F. Breathing:   Observed ---------------------------------------------------------------------- OB History    Blood Type:   O+  Gravidity:    6         Term:   2         SAB:   3  Living:       2 ---------------------------------------------------------------------- Gestational Age    LMP:           32w 3d        Date:  05/25/22                   EDD:   03/01/23  Best:          Armida Sans 3d     Det. By:  LMP  (05/25/22)          EDD:   03/01/23 ---------------------------------------------------------------------- Anatomy    Cranium:               Appears normal         Abdomen:                Ascites  Ventricles:            Appears normal         Kidneys:                Appear normal  Diaphragm:             Appears normal         Bladder:                Appears normal  Stomach:               Appears normal, left                         sided ---------------------------------------------------------------------- Doppler - Fetal Vessels    Middle Cerebral Artery                                                       PSV   MoM                                                     (  cm/s)                                                       58.9  1.31   ---------------------------------------------------------------------- Impression    Patient was admitted with complaints of decreased fetal  movements.  BPP was not reassuring.  She was on  continuous monitoring and NST is reactive.  On today's ultrasound, polyhydramnios is seen (maximum  vertical  pocket 9 cm).  Bowel loop dilations (not fluid-filled) is  seen with moderate ascites.  Antenatal testing is reassuring.  BPP 8/8.  Cephalic presentation. Middle cerebral artery  Doppler showed normal peak systolic velocity measurements  (no evidence of anemia).  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  I spoke with the patient on the phone and explained  ultrasound findings and my suspicion of bowel obstruction.  Patient still complains of decreased fetal movements.  I counseled the patient that the newborn may require surgery  and that we do not have pediatric surgeons available at our  hospital.  Because of the possibility of emergent delivery, I have  recommended transfer to [facility] (labor and  delivery).  If fetal status improves, she may be discharged  home and can be followed up at our office for weekly BPP.  Delivery will be at [facility].  Patient agreed with my recommendations.  I called [facility] and discussed patient details with Dr.  Kizzie Bane.  She kindly accepted transfer of this patient.  I called [facility] and initiated the process.  Dr. Vergie Living  [facility] attending) will complete transfer of this patient to [facility].    This study was remotely read . ---------------------------------------------------------------------- Recommendations    -Weekly BPP if undelivered.  -Delivery at [facility]. ----------------------------------------------------------------------                       Noralee Space, MD Electronically Signed Corrected Final Report  01/07/2023 11:26 am   Narrative & Impression    ----------------------------------------------------------------------  OBSTETRICS REPORT                       (Signed Final 01/05/2023 09:07 am) ---------------------------------------------------------------------- Patient Info    ID #:       [id number]                           D.O.B.:  1996/01/18 (27 yrs)  Name:       Erin [facility]                  Visit Date: 01/05/2023 07:40 am ---------------------------------------------------------------------- Performed By    Attending:        Braxton Feathers DO       Ref. Address:     [address]  [address]                                                             [place]                                                             [address]  Performed By:     Anabel Halon          Location[facility]  Referred By:      [facility] ---------------------------------------------------------------------- Orders    #  Description                           Code        Ordered By  1  [place] MFM OB FOLLOW UP                   60454.09    Braxton Feathers ----------------------------------------------------------------------    #  Order #                     Accession #                Episode #  1  [id number]                   [id number]                 [id number] ---------------------------------------------------------------------- Indications    Genetic carrier (silent carrier alpha thal)    Z14.8  Echogenic bowel                                O35.8XX0 028.3  [redacted] weeks gestation of pregnancy                Z3A.32  Encounter for other antenatal screening        Z36.2  follow-up  Previous cesarean delivery, antepartum         O34.219  LR female, neg AFP ---------------------------------------------------------------------- Fetal Evaluation    Num Of Fetuses:         1  Fetal Heart Rate(bpm):  155  Cardiac Activity:       Observed  Presentation:           Cephalic   Placenta:               Posterior  P. Cord Insertion:      Previously seen    Amniotic Fluid  AFI FV:      Within normal limits    AFI Sum(cm)     %Tile       Largest Pocket(cm)  22.68           88          7.04    RUQ(cm)       RLQ(cm)       LUQ(cm)        LLQ(cm)  4.97          5.05          7.04           5.62 ---------------------------------------------------------------------- Biometry  BPD:      83.6  mm     G. Age:  33w 5d         83  %    CI:        81.75   %    70 - 86                                                          FL/HC:      20.9   %    19.1 - 21.3  HC:      291.8  mm     G. Age:  32w 1d         15  %    HC/AC:      1.03        0.96 - 1.17  AC:      284.5  mm     G. Age:  32w 3d         59  %    FL/BPD:     73.1   %    71 - 87  FL:       61.1  mm     G. Age:  31w 5d         26  %    FL/AC:      21.5   %    20 - 24    Est. FW:    1949  gm      4 lb 5 oz     45  % ---------------------------------------------------------------------- OB History    Blood Type:   O+  Gravidity:    6         Term:   2         SAB:   3  Living:       2 ---------------------------------------------------------------------- Gestational Age    LMP:           32w 1d        Date:  05/25/22  EDD:   03/01/23  U/S Today:     32w 4d                                        EDD:   02/26/23  Best:          32w 1d     Det. By:  LMP  (05/25/22)          EDD:   03/01/23 ---------------------------------------------------------------------- Targeted Anatomy    Erin Nervous System  Calvarium/Cranial V.:  Appears normal         Cereb./Vermis:          Previously seen  Cavum:                 Previously seen        [place]:         Previously seen  Lateral Ventricles:    Previously seen        Midline Falx:           Previously seen  Choroid Plexus:        Previously seen    Spine  Cervical:              Previously seen        Sacral:                 Previously seen  Thoracic:               Previously seen        Shape/Curvature:        Previously seen  Lumbar:                Previously seen    Head/Neck  Lips:                  Previously seen        Profile:                Previously seen  Neck:                  Previously seen        Orbits/Eyes:            Previously seen  Nuchal Fold:           Previously seen        Mandible:               Previously seen  Nasal Bone:            Previously seen        Maxilla:                Previously seen    Thorax  4 Chamber View:        Previously seen        Interventr. Septum:     Previously seen  Cardiac Activity:      Observed               Cardiac Axis:           Previously een  Cardiac Situs:         Previously seen        Diaphragm:              Appears normal  Rt Outflow Tract:      Previously seen  3 Vessel View:          Previously seen  Lt Outflow Tract:      Previously seen        3 V Trachea View:       Previously seen  Aortic Arch:           Previously seen        IVC:                    Previously seen  Ductal Arch:           Previously seen        Crossing:               Previously seen  SVC:                   Previously seen    Abdomen  Ventral Wall:          Previously seen        Lt Kidney:              Appears normal  Cord Insertion:        Previously seen        Rt Kidney:              Appears normal  Situs:                 Previously seen        Bladder:                Appears normal  Stomach:               Appears normal         Bowel:                  Echogenic bowel    Extremities  Lt Humerus:            Previously seen        Lt Femur:               Previously seen  Rt Humerus:            Previously seen        Rt Femur:               Previously seen  Lt Forearm:            Previously seen        Lt Lower Leg:           Previously seen  Rt Forearm:            Previously seen        Rt Lower Leg:           Previously seen  Lt Hand:               Previously seen        Lt Foot:                 Previously seen  Rt Hand:               Previously seen        Rt Foot:                Previously seen    Other  Umbilical Cord:        Previously seen        Genitalia:  Previously seen ---------------------------------------------------------------------- Cervix Uterus Adnexa    Cervix  Not visualized (advanced GA >24wks) ---------------------------------------------------------------------- Comments    The patient is here for ultrasound at 32w 1d. EDD:  03/01/2023 dated by LMP  (05/25/22).  Her pregnancy is  complicated by echogenic bowel She has no further concerns  today.    Sonographic findings  Single intrauterine pregnancy.  Fetal cardiac activity:  Observed and appears normal.  Presentation: Cephalic.  Interval fetal anatomy appears normal except for mild  echogenic bowel. There is no dilation or other abnormalities.  Fetal biometry shows the estimated fetal weight at the 45  percentile.  Amniotic fluid volume: Within normal limits. MVP: 7.04 cm.  Placenta: Posterior.    Recommendations  1. Serial growth ultrasounds every 4-6 weeks until delivery  2. Declines CF testing or amnio  3. Toxo/parvo neg ----------------------------------------------------------------------                   Braxton Feathers, DO Electronically Signed Final Report   01/05/2023 09:07 am    Discharge Exam: Blood pressure 122/85, pulse (!) 122, temperature 97.9 F (36.6 C), resp. rate 16, weight 78 kg, last menstrual period 05/25/2022, SpO2 99%. General appearance: alert FHT: 160 baseline, no accels or decel, mod variability Tocometry: quiet.   Disposition: [facility]   Allergies as of 01/07/2023   No Known Allergies      Medication List     STOP taking these medications    Gojji Weight Scale Misc       TAKE these medications    prenatal multivitamin Tabs tablet Take 1 tablet by mouth daily at 12 noon.         SignedRandolph Bing 01/07/2023, 10:53  AM

## 2023-01-07 NOTE — H&P (Signed)
 [facility] Labor and Delivery  Obstetrics Admission History and Physical  Chief Complaint: Non-stress Test  Prenatal Provider:     Carter  History of Present Illness   Erin French is a 27 y.o. [id number]  at [redacted]w[redacted]d by LMP with an EDD of Not found..   Her pregnancy is complicated by:  Hx of c-section d/t malpresentation (breech) Fibroid uterus LGSIL on pap smear of cervix Severe major depression Alpha thalassemia silent carrier  The patient presents today via transfer from [facility].  She was admitted at the OSH on 11/13 following presentation for decreased fetal movement and non-reactive NST for extended fetal monitoring.   This pregnancy complicated by fetal echogenic bowel, first noted on US  11/03/2022. Negative TORCH studies. Patient's obstetric history pertinent for PLTCS (breech) in 2020 and NSVD in 2017.   From [facility] Discharge summary:  Patient admitted 11/13 with CC of decreased fetal movement. NST non reactive so BPP done which was 4/8 for no breathing or movement noted, AFI 19.5, echogenic bowel still seen with minimal ascites. Patient admitted and kept on continuous monitoring, BMZ #1 given at 1138am and repeat BPP done overnight which was six hours later and showed bpp 6/10 for no sustained breathing with still normal afi of 20.8 and this time MCA was done which was <1 MoM. On 11/14, she had another BPP that was 8/8, cephalic, AFI 21.9, MCA still normal at 1.31 but now with moderate ascites and dilated bowel loops (see below). MFM Dr. Arna felt transfer to [facility] for further evaluation was best for the patient so she was transferred.  BMZ#2 given on 11/14 at noon.  Ms. Erin French primary acute concerns regard her baby's wellbeing. She has felt him move much less lately. No VB or leakage of fluid.   ROS  Review of systems as per HPI and below. All other systems reviewed and negative.   Pain: right upper back pain Contractions:  Infrequent Leaking fluid: no Vaginal bleeding: No Fetal movement: Decreased Tolerating POs: yes Shortness of breath: Some Vision changes: No Epigastric pain: No Headache: Yes, relieved by rest and/or tylenol    NIDA/TAPS screen: Negative.  Tobacco: Never  Alcohol: Never  Recreational drugs: Never     Prescription drugs: Never No indication to order urine drug screen.  Perinatal Information  A1c: 5.1  ABO/RH: O Positive /--/--/-- (11/14 1820) O+ Antibody: Negative  (11/14 1736) negative Rubella:   immune Varicella:   Syphilis: Negative /-- (11/14 1736) nonreactive HepB:   negative HepC:  negative HIV:   nonreactive Gonorrhea:   negative Chlamydia:   negative GBS:   unknown  No results found for: JYL JAE SHAYLA DEANN DONNIA SONDRA LEQUITA RANDELL KEREN BERTON, [id number], [id number], [id number], [id number]  Anatomy scan: 9/10 Impression Single intrauterine pregnancy here for a detailed anatomy echogenic bowel. Normal anatomy with measurements consistent with dates There is good fetal movement and amniotic fluid volume Suboptimal views of the fetal anatomy were obtained secondary to fetal position.  Echogenic abdominal lesions are areas of abnormal brightness in the fetal abdomen, with echogenicity similar to that of surrounding bone, and producing various amounts of acoustic shadowing. Echogenic bowel (approximately 1% of second-trimester pregnancies) results from excessively thick meconium caused by an aperistaltic small bowel. Swallowed blood can also produce an echogenic appearance. Echogenic bowel is associated with aneuploidy, TORCH (toxoplasmosis, other [e.g., syphilis, parvovirus B19, varicella], rubella, cytomegalovirus [CMV], herpesvirus) infection, meconium ileus, cystic fibrosis, placental hemorrhage, ischemia, or can be a normal variant. Approximately 50% of  fetuses with echogenic bowel have other  structural abnormalities or fetal growth restriction (or both). Aneuploidy (trisomy 13, 18, 21, triploidy) is present in up to 10%.; Cystic fibrosis is present in 13%-40% of patients with meconium ileus and TORCH infections are present in 10%.  Approximately 50% of cases of isolated echogenic bowel resolve spontaneously prenatally.  At this time we recommend NIPT (LR), cystic fibrosis screeing (Negative-per sarah in the [place] portal)., serologies for toxoplasmosis and CMV (drawn today) and serial ultrasounds for fetal growth, amniotic fluid and worsening fetal condition (ie hydrops fetalis). Assuming no chromosome defects, cystic fibrosis, infection, growth restriction, or other associated anatomic abnormalities, the prognosis is good for normal outcome.  I discussed the option of screening vs diagnostic studies and the risk and benefits of both options. At this time Ms.Erin French opted to obtain TORCH titers and declined an amniocenteis as the diagnosis would not change her care for the pregnancy.  All questions answered  She will return in 4 weeks for growth and assessment of echogenic bowel.   Last U/S: 11/14 via OSH Patient was admitted with complaints of decreased fetal  movements.  BPP was not reassuring.  She was on  continuous monitoring and NST is reactive.  On today's ultrasound, polyhydramnios is seen (maximum  vertical pocket 9 cm).  Bowel loop dilations (not fluid-filled) is  seen with moderate ascites.  Antenatal testing is reassuring.  BPP 8/8.  Cephalic presentation. Middle cerebral artery  Doppler showed normal peak systolic velocity measurements  (no evidence of anemia).  Postpartum plans:  Birth control plan: Non-hormonal IUD Anticipated Delivery Method: Spontaneous vaginal Previous C-Section?: Yes Anticipated gender: Boy Are you planning for a female circumcision?: Yes Nutrition Plan: Exclusive breast feeding Has written birth plan?: No   History    OB History  Gravida Para Term Preterm AB Living  7 2 2   4 2   SAB IAB Ectopic Molar Multiple Live Births    1       2    # Outcome Date GA Lbr Len/2nd Weight Sex Type Anes PTL Lv  7 Current           6 IAB 2023          5 Term 11/17/18 [redacted]w[redacted]d  3.062 kg (6 lb 12 oz) F CS-Unspec Spinal  LIV  4 Term 04/12/15 [redacted]w[redacted]d / 00:41 3.11 kg (6 lb 13.7 oz) M Vag-Spont None  LIV  3 AB           2 AB           1 AB             No past medical history on file.   No past surgical history on file.   No family history on file.   Social Drivers of Health with Concerns   Food Insecurity: No Food Insecurity (01/07/2023)   Hunger Vital Sign   . Worried About Programme researcher, broadcasting/film/video in the Last Year: Never true   . Ran Out of Food in the Last Year: Never true  Recent Concern: Food Insecurity - Food Insecurity Present (01/06/2023)   Received from [facility]   Hunger Vital Sign   . Worried About Programme researcher, broadcasting/film/video in the Last Year: Sometimes true   . Ran Out of Food in the Last Year: Sometimes true  Transportation Needs: Unknown (01/07/2023)   PRAPARE - Transportation   . Lack of Transportation (Medical): No  Physical Activity: Insufficiently Active (09/03/2022)  Received from [facility]   Exercise Vital Sign   . Days of Exercise per Week: 1 day   . Minutes of Exercise per Session: 30 min  Stress: Stress Concern Present (09/03/2022)   Received from [facility] - Occupational Stress Questionnaire   . Feeling of Stress : To some extent  Social Connections: Not on File (11/03/2022)   Received from [employer]   . Connectedness: 0  Recent Concern: Social Connections - Moderately Isolated (09/03/2022)   Received from [facility]   Social Connection and Isolation Panel [NHANES]   . Frequency of Communication with Friends and Family: Three times a week   . Frequency of Social Gatherings with Friends and Family: Never   . Attends Religious  Services: More than 4 times per year   . Active Member of Clubs or Organizations: No   . Attends Banker Meetings: Never   . Marital Status: Never married  Housing Stability: High Risk (01/07/2023)   Housing Stability Vital Sign   . Unable to Pay for Housing in the Last Year: No   . Number of Times Moved in the Last Year: 3   . Homeless in the Last Year: No   Abuse/neglect Assessment: Is Anyone Hurting/Threatening You or Making You Feel Afraid? : No  Medications   Medications Prior to Admission  Medication Sig Dispense Refill  . prenatal vitamin with iron-folic acid (PRENATAL TABLETS) tablet Take 1 tablet by mouth once daily     Allergies  No Known Allergies Objective   Vitals:   01/07/23 1816 01/07/23 1945 01/07/23 2014 01/07/23 2100  BP:    135/79  Pulse:    99  Resp:    18  Temp:    36.7 C (98.1 F)  TempSrc:    Oral  SpO2:    98%  PainSc:   4   7   7    PainLoc: Back Back     General: alert, cooperative, and in NAD Respiratory: Normal work of breathing, normal respiration rate Abdomen: gravid Uterine Size: consistent with dates Neuro: alert and oriented x 4 and answers questions appropriately Psych: normal mood, behavior, speech, dress and thought processes and pt is a good historian Extremities: grossly normal with no edema or cyanosis Pelvic: deferred Sterile Speculum Exam: deferred  Cervical Exam: deferred      Electronic Fetal Monitoring:  NST Start Time: 1548  NST End Time: 1613  NST Total Time: 25 Minute(s)  Fetal Heart Tones: 150s / moderate variability / + accelerations / - decelerations   Toco: infrequent  Interpretation: Reactive  Assessment and Plan  Roopa Kaelin is a 27 y.o. H2E7957 at [redacted]w[redacted]d GA who presents for fetal monitoring and further workup of fetal echogenic bowel.  Plan by Problem:  #Fetal echogenic bowel #Fetal abdominal ascites  - Limited US  performed on admission demonstrates dilated loops of echogenic bowel, in  the mid-abdominal and pelvic region, measuring up to 2.2 cm in diameter. Differential is concerning for bowel atresia, ileus or perforation. Small volume abdominal ascites noted, which may be related to fetal bowel perforation.  - Cell-free DNA screening previously performed earlier in pregnancy with risk-reducing results.  - Toxoplasma IgG and IgM maternal serology negative. - CMV IgG positive with negative CMV IgM (on 11/03/2022). - Amniocentesis and cystic fibrosis screening offered and declined, per [facility] MFM notes.  - Plan on complete ultrasound tomorrow. - Counseling regarding delivery planning and timing  pending ultrasound results and fetal wellbeing.  #Dehydration #Fatigue - CBC, CMP, UA ordered - Encouraged PO hydration  #Mild-range blood pressure - Asymptomatic - Normal CBC, CMP, urine protein on admit - Continue to monitor blood pressures  Disposition: Admit to antepartum for further fetal evaluated and monitoring ----------------------------------------------- Level of Interpreter Services: No interpreter needed (no language barrier)  Discussed with Dr. Romero Shams PGY4 who agrees with the assessment and plan.  Mitzie Mana MS2  CANICE LEI COLEN MOULDS, MD  01/08/2023  Next OB Appointment: No future appointments.   Attestation Statement:   I personally saw and evaluated the patient, and participated in the management and treatment plan as documented in the resident/fellow note.  I visited with Ms. Soh and conveyed the results of the limited ultrasound performed on admission, which demonstrates similar findings of echogenic fetal bowel and small volume abdominal ascites. I briefly discussed the differential diagnosis. I explained that the plan will depend on the complete ultrasound, to be performed tomorrow.   MARYELIZABETH CHARLOTTA JAMES, MD   ------------------------------------------------------------------------------- Attestation signed by  Tanja Delon NOVAK, MD at 01/26/2023 12:04 AM MFM Attending  Attestation Statement:   This service was rendered under my overall direction and control, and I was immediately available via phone/pager or present on site.  Delon NOVAK Tanja, MD   -------------------------------------------------------------------------------

## 2023-01-07 NOTE — Progress Notes (Addendum)
Maternal-Fetal Medicine  Patient was admitted with complaints of decreased fetal movements.  BPP was not reassuring.  She was on continuous monitoring and NST is reactive.  On today's ultrasound, polyhydramnios is seen (maximum vertical pocket 9 cm).  Bowel loop dilations (not fluid-filled) is seen with moderate ascites.  Antenatal testing is reassuring.  BPP 8/8.  Cephalic presentation. 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 Middle cerebral artery Doppler showed normal peak systolic velocity measurements (no evidence of anemia).  This study was remotely read.  I spoke with the patient on the phone and explained ultrasound findings and my suspicion of bowel obstruction.  Patient still complains of decreased fetal movements.  I counseled the patient that the newborn may require surgery and that we do not have pediatric surgeons available at our hospital.  Because of the possibility of emergent delivery, I recommended transfer to [facility] (labor and delivery).  If fetal status improves, she may be discharged home and can be followed up at our office for weekly BPP.  Delivery will be at [facility].  Patient agreed with my recommendations.  I called [facility] and discussed patient details with Dr. Kizzie Bane.  She kindly accepted transfer of this patient. I called transfer center and initiated the process.  Dr. Vergie Living [facility] attending) will complete transfer of this patient to [facility].  [facility] Phone: [phone] Fax: [phone]  Transfer Center is requesting patient's face-sheet to be faxed.

## 2023-01-11 NOTE — Discharge Summary (Signed)
 Obstetrics Antepartum Discharge Summary   Admit Date: 01/07/2023 Discharge Date: 01/09/23 Discharging Service: Obstetrics   Primary / Referring OB: Dane  Admission Diagnoses Discharge Diagnoses Antepartum / Pregnancy Complications:  27 y.o. Pregnancy at [redacted]w[redacted]d admitted for fetal abdominal ascites and fetal echogenic bowel [redacted]w[redacted]d discharged with fetal abdominal ascites and fetal echogenic bowel Hx of c-section d/t malpresentation (breech) Fibroid uterus LGSIL on pap smear of cervix Severe major depression Alpha thalassemia silent carrier   Consult(s) Requested: None  Surgeries/Procedures Performed: None  Hospital Course:   Erin French is a 27 y.o. [id number] who was admitted as a transfer from [facility] with decreased fetal movement and non-reactive NST for extended fetal monitoring. Per [facility] discharge summary: Patient admitted 11/13 with CC of decreased fetal movement. NST non reactive so BPP done which was 4/8 for no breathing or movement noted, AFI 19.5, echogenic bowel still seen with minimal ascites. Patient admitted and kept on continuous monitoring, BMZ #1 given at 1138am and repeat BPP done overnight which was six hours later and showed bpp 6/10 for no sustained breathing with still normal afi of 20.8 and this time MCA was done which was <1 MoM. On 11/14, she had another BPP that was 8/8, cephalic, AFI 21.9, MCA still normal at 1.31 but now with moderate ascites and dilated bowel loops (see below). MFM Dr. Arna felt transfer to [facility] for further evaluation was best for the patient so she was transferred. BMZ#2 given on 11/14 at noon. On arrival pt reported feeling well, plan for AP admission with repeat ultrasound.   Pt underwent ultrasound but on day of discharge reported she had an emergency at home and had to leave. It was recommended she stay for NST but pt was unable to do so and ultimately left AMA.  Hospital course by problem:  Fetal echogenic  bowel Noted on early US  and redemonstrated on FDC scan yesterday in addition to mild ascites. In the setting of new decreased fetal movement with nonreactive NST, concern for evolving clinical process. A hydrops evaluation yesterday was overall reassuring with normal AFI, no other signs of edema, and normal MCA dopplers.    The differential for fetal echogenic bowel includes aneuploidies, cystic fibrosis, infectious causes such as CMV, or intraabdominal process such as bowel perforation. We discussed her risk reducing cffDNA is overall reassuring from a genetic perspective, however we will not be certain unless genetic testing were performed via amniocentesis or following delivery. Patient offered and declined amniocentesis. 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 - ICN consult pending - Daily NST through the weekend   Contractions Leakage of fluid - no contractions on toco  - negative ROM eval, treated for yeast and BV   Fetal Well-being - Monitoring with daily NST - s/p US  and growth 11/14; f/u anatomy today  - antenatal steroids given on 11/13-14, mag at OSH    Routine prenatal care: TDaP given 11/15  Antenatal steroids were given during her hospitalization on 11/13-11/14.  Studies:   Imaging Performed: FDC anatomy Relevant Labs: + yeast, + BV Results Pending at Discharge: None  Discharge:   Discharge Disposition: Home Condition at Discharge: stable Follow up Testing / appt plan: Will need delivery at [facility] (with ICN and Peds surgery consults scheduled). States she would be unable to come for regular prenatal care due to distance. 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   Discharge summary  completed via chart review.   VIR[place]  ANNE GORACKE, NP  01/11/2023 ------------------------------------------------ No future appointments.   Medication List     ASK your doctor about these  medications    prenatal vitamin with iron-folic acid tablet Commonly known as: PRENATAL TABLETS       ------------------------------------------------------------------------------- Attestation signed by Darron Lauraine Craven, MD at 01/11/2023  1:51 PM Attestation Statement:   This service was rendered under my overall direction and control, and I was immediately available via phone/pager or present on site.  LAURAINE JAYSON DARRON, MD  -------------------------------------------------------------------------------

## 2023-01-12 NOTE — H&P (Signed)
 [facility] Labor and Delivery  Obstetrics Admission History and Physical  Chief Complaint: Rupture of Membranes (0200 11/18)  Prenatal Provider: [facility]     History of Present Illness   Erin French is a 27 y.o. [id number] at [redacted]w[redacted]d with an EDD of 03/02/2023, by Ultrasound.   Her pregnancy is complicated by:    Alpha thalassemia silent carrier   Fibroid uterus   H/O: C-section   Echogenic bowel of fetus on prenatal ultrasound   LGSIL on Pap smear of cervix  The patient is presenting today for suspected PPROM. Patient got up at [facility] to pee and had leaking of fluid that she could not stop with her bladder. Patient last had sex 24 hours ago and was checked with gel at an outside hospital. She continued to leak throughout the day and presented to an outside hospital where she was put on antibiotics. She is BMTZ complete as of 11/14. She is contracting 30-79mins for the past several hours with no increased strength or frequency. Denies vaginal bleeding, decreased fetal movement.  ROS   Review of systems as per HPI and below. All other systems reviewed and negative.  Pain: location: abdominal Contractions: Infrequent Leaking fluid: yes since 3AM Vaginal bleeding: No Fetal movement: Normal Tolerating POs: yes Shortness of breath: No Vision changes: No Epigastric pain: No Headache: No  NIDA/TAPS screen: Negative.  Tobacco: Never  Alcohol: Never  Recreational drugs: Never     Prescription drugs: Never No indication to order urine drug screen.  Perinatal Information   ABO/RH: O Positive /--/--/-- (11/14 1820)  Antibody: Negative  (11/14 1736)  Rubella:   Collecting Varicella:  Collecting Syphilis: Negative /-- (11/14 1736)  HepB:   Collecting HepC:  Collecting HIV:   Collecting Gonorrhea:   Collecting Chlamydia:   Collecting GBS:   Collecting   Anatomy scan: 01/08/2023 Indication ========   Detailed  Anatomy: H/o c/s. Concern for echogenic dilated bowel and fetal ascites on outside ultrasound   Pregnancy =========   Singleton pregnancy. Number of fetuses: 1 This exam was performed at the [facility].   Dating ======          Date    Details     Gest. age   EDD LMP    05/25/2022                32 w + 4 d  03/01/2023 Previous U/S   08/01/2022    GA, GA 9 w + 4 d        32 w + 3 d  03/02/2023 Assigned dating    based on the LMP, selected on 01/07/2023         32 w + 4 d 03/01/2023   General Evaluation ==============   Cardiac activity Present. Presentation: Cephalic Placenta: Placental site: Posterior. No evidence of previa Umbilical cord: Cord vessels: 3 vessel cord documented previously Amniotic fluid: Amount of AF: Normal.   Fetal Anatomy ===========   Head / Neck    Lateral ventricles: Normal. Choroid plexus: Normal. Midline falx: Normal. Cavum septi pellucidi: Normal. Cerebellum: Normal. Cisterna magna:        Normal. Face   Lips: Normal. Profile: Normal. Nose: Normal. Palate: Normal. Orbits: Normal. Heart / Thorax 4-chamber view: Normal. RVOT view: Normal. LVOT view: Normal. 3-vessel-trachea view: Normal. Aortic arch view: Normal. Bicaval view:        Normal. Ductal arch view: Normal. Great vessels: Normal, (Short  axis view at the level of the great vessels).        Right lung: Normal. Left lung: Normal. Diaphragm: Normal. Abdomen    Cord insertion: Normal. Stomach: Normal. Kidneys: Normal. Bladder: Normal. Bowel: See impression. Genitals: Female. Spine  Cervical spine: Normal. Thoracic spine: Normal. Lumbar spine: Normal. Sacral spine: Normal. Extremities / Skeleton Arms: Present. Hands: Present. Legs: Present. Feet: Present. Position of hands: Normal. Position of feet: Normal.   Impression =========   Intrauterine pregnancy with an estimated gestational age of 24w 4d. Dating assigned by LMP concordant with previous u/s performed at [facility] on 08/01/2022; measurements  at that exam reported as 9w 4d.   The patient was transferred yesterday from [facility] to the [facility] due to concern for fetal dilated bowel and ascites. She underwent a limited anatomy US  on arrival demonstrating: - Bowel wall is relatively echogenic but not echogenic as bone - There are dilated loops of fetal bowel in the mid abdominal and pelvic region, measuring up to 2.2 cm in diameter - A 3.8 x 2.5 cm cystic appearing structure in the lower abdominal region does not clearly communicate with bowel and may represent a meconium pseudocyst (versus dilated segment of non-peristalsing bowel - Small fetal ascites - No evidence of hydrops - Normal EFW, AFI, and MCA Dopplers   Today's exam was performed to complete fetal anatomical survey. 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   Other fetal anatomy was visualized and appears normal.   Per [facility] notes, counseling has been performed about dilated and echogenic bowel, including association with conditions such as fetal cystic fibrosis and other genetic conditions, congenital infection, and bowel atresia. Cell free fetal DNA screening was performed earlier in pregnancy (core aneuploidy panel plus triploidy screening, 22q11.2 deletion screening and sex chromosome screening) with risk reducing results. Toxoplasma IgG and IgM maternal serology results were negative. CMV IgG testing was positive with negative CMV IgM (on 11/03/22). Amniocentesis and  cystic fibrosis screening have been offered and declined per [facility] notes.  Postpartum plans:  Birth control plan: Non-hormonal IUD Anticipated Delivery Method: Spontaneous vaginal Previous C-Section?: Yes Anticipated gender: Boy Are you planning for a female circumcision?: Yes Nutrition Plan: Exclusive breast feeding Has written birth plan?: No  History   OB History  Gravida Para Term Preterm AB Living  7 2 2   4 2   SAB IAB Ectopic Molar Multiple Live Births    1       2    # Outcome Date GA Lbr Len/2nd Weight Sex Type Anes PTL Lv  7 Current           6 IAB 2023          5 Term 11/17/18 [redacted]w[redacted]d  3.062 kg (6 lb 12 oz) F CS-Unspec Spinal  LIV  4 Term 04/12/15 [redacted]w[redacted]d / 00:41 3.11 kg (6 lb 13.7 oz) M Vag-Spont None  LIV  3 AB           2 AB  1 AB             No past medical history on file.   No past surgical history on file.   No family history on file.   Social Drivers of Health with Concerns   Food Insecurity: No Food Insecurity (01/07/2023)   Hunger Vital Sign   . Worried About Programme researcher, broadcasting/film/video in the Last Year: Never true   . Ran Out of Food in the Last Year: Never true  Recent Concern: Food Insecurity - Food Insecurity Present (01/06/2023)   Received from [facility]   Hunger Vital Sign   . Worried About Programme researcher, broadcasting/film/video in the Last Year: Sometimes true   . Ran Out of Food in the Last Year: Sometimes true  Transportation Needs: Unknown (01/07/2023)   PRAPARE - Transportation   . Lack of Transportation (Medical): No  Physical Activity: Insufficiently Active (09/03/2022)   Received from [facility]   Exercise Vital Sign   . Days of Exercise per Week: 1 day   . Minutes of Exercise per Session: 30 min  Stress: Stress Concern Present (09/03/2022)   Received from [facility] of Occupational Health - Occupational Stress Questionnaire   . Feeling of Stress : To some extent  Social Connections: Not on File (11/03/2022)    Received from [employer]   . Connectedness: 0  Recent Concern: Social Connections - Moderately Isolated (09/03/2022)   Received from [facility]   Social Connection and Isolation Panel [NHANES]   . Frequency of Communication with Friends and Family: Three times a week   . Frequency of Social Gatherings with Friends and Family: Never   . Attends Religious Services: More than 4 times per year   . Active Member of Clubs or Organizations: No   . Attends Banker Meetings: Never   . Marital Status: Never married  Housing Stability: High Risk (01/07/2023)   Housing Stability Vital Sign   . Unable to Pay for Housing in the Last Year: No   . Number of Times Moved in the Last Year: 3   . Homeless in the Last Year: No   Abuse/neglect Assessment: Is Anyone Hurting/Threatening You or Making You Feel Afraid? : No  Medications   Medications Prior to Admission  Medication Sig Dispense Refill  . prenatal vitamin with iron-folic acid (PRENATAL TABLETS) tablet Take 1 tablet by mouth once daily     Allergies  No Known Allergies Objective   Vitals:   01/12/23 0216 01/12/23 0221 01/12/23 0223  BP:  120/85   Pulse:  100   Resp:  18   Temp:  36.7 C (98.1 F)   TempSrc:  Oral   SpO2:  100%   Weight: 78.4 kg (172 lb 13.5 oz)    PainSc:     8  PainLoc:   Back   General: alert, cooperative, and in NAD Cardiovascular: regular rate and rhythm Respiratory: Normal work of breathing, normal respiration rate Abdomen: gravid Uterine Size: consistent with dates Neuro: alert and oriented x 4 and answers questions appropriately Psych: normal mood, behavior, speech, dress and thought processes and pt is a good historian Extremities: grossly normal with no edema or cyanosis Pelvic: normal appearing vulva with no masses, tenderness or lesions Sterile Speculum Exam: pooled fluid appearing clear, Nitrizine test is positive, Ferning test is positive  cephalic by BSUS, AFI  19  Electronic Fetal Monitoring:  NST Start Time: 0221  NST End Time: 0304  NST Total Time: 43 Minute(s)  Fetal Heart Tones: 145 / moderate variability / present accelerations / absent decelerations   Toco: 1q10  Interpretation: reactive  Assessment and Plan   Adrien Tischer is a 27 y.o. H2E7957 at [redacted]w[redacted]d with an EDD of 03/02/2023, by Ultrasound who presents for PPROM.  Plan by Problem:  *Rule out rupture of membranes, preterm - Patient reports possible ROM @ 3AM 11/18.  Membranesruptured.  - SSE with evidence of rupture  Positive pooling  Positive Nitrazine  Positive ferning - Toco with regular contractions - BSUS with adequate fluid, AFI 19, fluid out of cervix with cough on speculum exam  # PPROM; confirmed by workup - 12mg  Betamethasone  IM q24h x 2 doses for pulmonary advancement (Completed 11/14) - Latency antibiotics ordered: - 2g Ampicillin IV q6h x 48h, followed by Amoxicillin 500mg  PO TID x 5d - 500mg  Azithromycin  PO once, followed by 250mg  PO qd x 6d - NICU consult placed  # FWB  - NST: reactive  - GBS: unknown, pending  The following were reviewed in triage: [X]  Maternal vitals - stable [X]  Fetal heart rate monitoring - NST reactive [X]  Toco - 1q10 [X]  SSE - evidence of ROM [X]  Bedside U/S - confirmed presentation, cephalic [X]  GBS status: collected [X]  U/A: pending  Disposition: Admit to active. Reason for admission: Preterm Premature Rupture of Membranes (PPROM) ----------------------------------------------- Level of Interpreter Services: No interpreter needed (no language barrier)  Plan discussed with April Comer, PGY-3  LORANE VERNELL ARTS, MD  01/12/2023  Next OB Appointment: No future appointments.  Attestation Statement:   This service was rendered under my overall direction and control, and I was immediately available via phone/pager or present on site.  Admit for PPROM, 33wks. 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 ICN aware. BMTZc. Admit for CEFM, latency antibiotics, expectant management unless delivery clinically indicated.   AMANDA OLIVIA BANKS, MD

## 2023-01-15 ENCOUNTER — Other Ambulatory Visit: Payer: Self-pay | Admitting: Obstetrics and Gynecology

## 2023-01-15 ENCOUNTER — Other Ambulatory Visit: Payer: Medicaid Other

## 2023-01-15 ENCOUNTER — Ambulatory Visit: Payer: Medicaid Other

## 2023-01-15 DIAGNOSIS — O403XX0 Polyhydramnios, third trimester, not applicable or unspecified: Secondary | ICD-10-CM

## 2023-02-03 ENCOUNTER — Ambulatory Visit: Payer: Medicaid Other

## 2023-04-14 ENCOUNTER — Telehealth: Payer: Self-pay | Admitting: Clinical

## 2023-04-14 NOTE — Telephone Encounter (Signed)
Call to pt, after verbal referral from Sudie Grumbling, [facility] doula program, due to positive EPDS screening of 15. Pt declines IBH service, says she feels much better after the baby has been born, but requests follow up postpartum visit with [facility]. Pt delivered at [facility], due to complications with baby, but received her ob care at [facility]. Pt agrees to call Asher Muir at [phone] if she has any further questions or concerns.

## 2023-07-29 ENCOUNTER — Emergency Department (HOSPITAL_COMMUNITY)

## 2023-07-29 ENCOUNTER — Other Ambulatory Visit: Payer: Self-pay

## 2023-07-29 ENCOUNTER — Emergency Department (HOSPITAL_COMMUNITY)
Admission: EM | Admit: 2023-07-29 | Discharge: 2023-07-30 | Discharge status: Against Medical Advice | Attending: Emergency Medicine | Admitting: Emergency Medicine

## 2023-07-29 DIAGNOSIS — Z5321 Procedure and treatment not carried out due to patient leaving prior to being seen by health care provider: Secondary | ICD-10-CM | POA: Diagnosis not present

## 2023-07-29 DIAGNOSIS — Y990 Civilian activity done for income or pay: Secondary | ICD-10-CM | POA: Diagnosis not present

## 2023-07-29 DIAGNOSIS — W269XXA Contact with unspecified sharp object(s), initial encounter: Secondary | ICD-10-CM | POA: Diagnosis not present

## 2023-07-29 DIAGNOSIS — S61215A Laceration without foreign body of left ring finger without damage to nail, initial encounter: Secondary | ICD-10-CM | POA: Insufficient documentation

## 2023-07-29 NOTE — ED Triage Notes (Signed)
 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

## 2023-07-30 NOTE — ED Notes (Signed)
 Pt called for rooming with no response.

## 2023-11-13 IMAGING — MR MR HEAD WO/W CM
7 of 12 series · 26 of 48 positions shown · IV contrast (Yes GAD)
Comparison: CT head 02/22/2021

CLINICAL DATA: Suspect viral meningitis versus autoimmune
encephalitis. Fever and altered mental status.

EXAM:
MRI HEAD WITHOUT AND WITH CONTRAST
TECHNIQUE: Multiplanar, multiecho pulse sequences of the brain and surrounding
structures were obtained without and with intravenous contrast.
CONTRAST:  5mL GADAVIST GADOBUTROL 1 MMOL/ML IV SOLN

[Series 2: FLAIR · sagittal · 5.0mm · 0.23mm/px · 1 of 24 slices shown (1 of 2)]
[im 1/24]
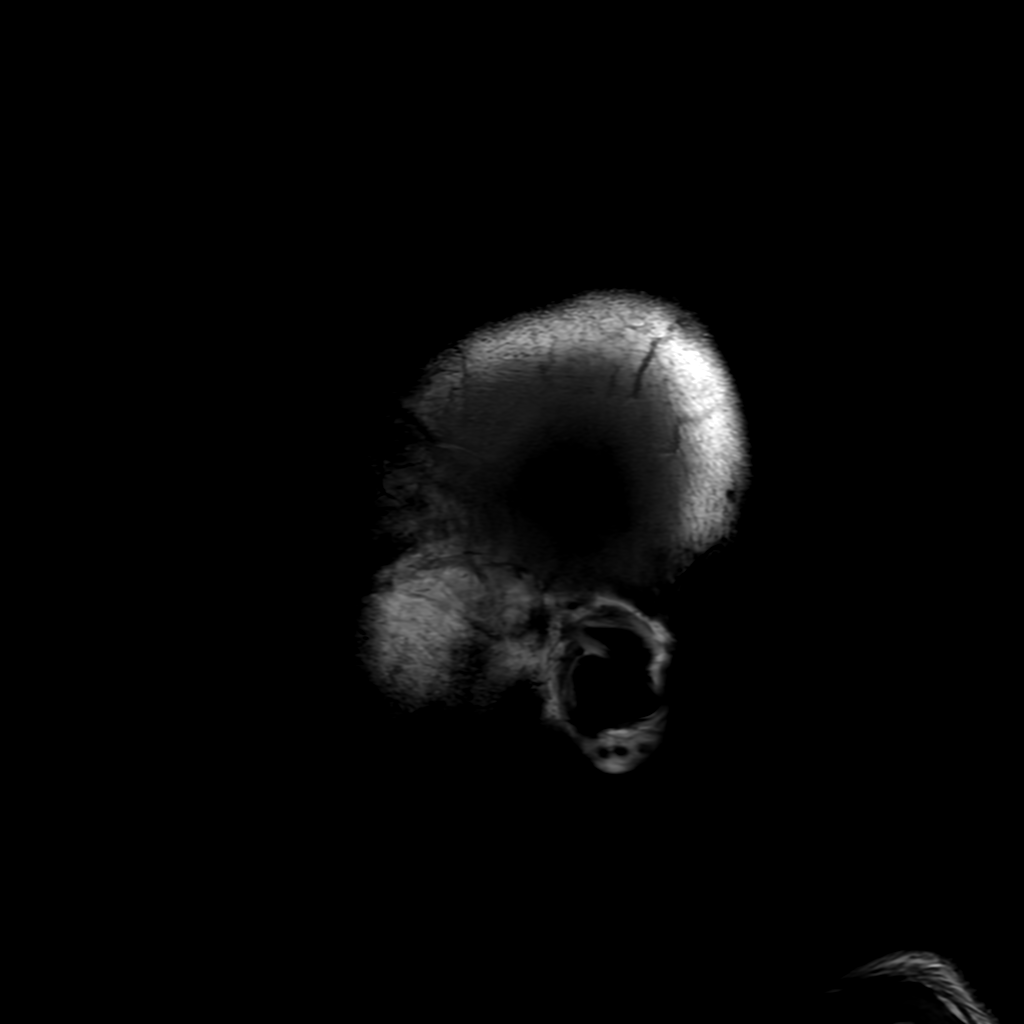

[Series 3: T2 · axial · 5.0mm · 0.23mm/px · 1 of 24 slices shown]
[im 1/24]
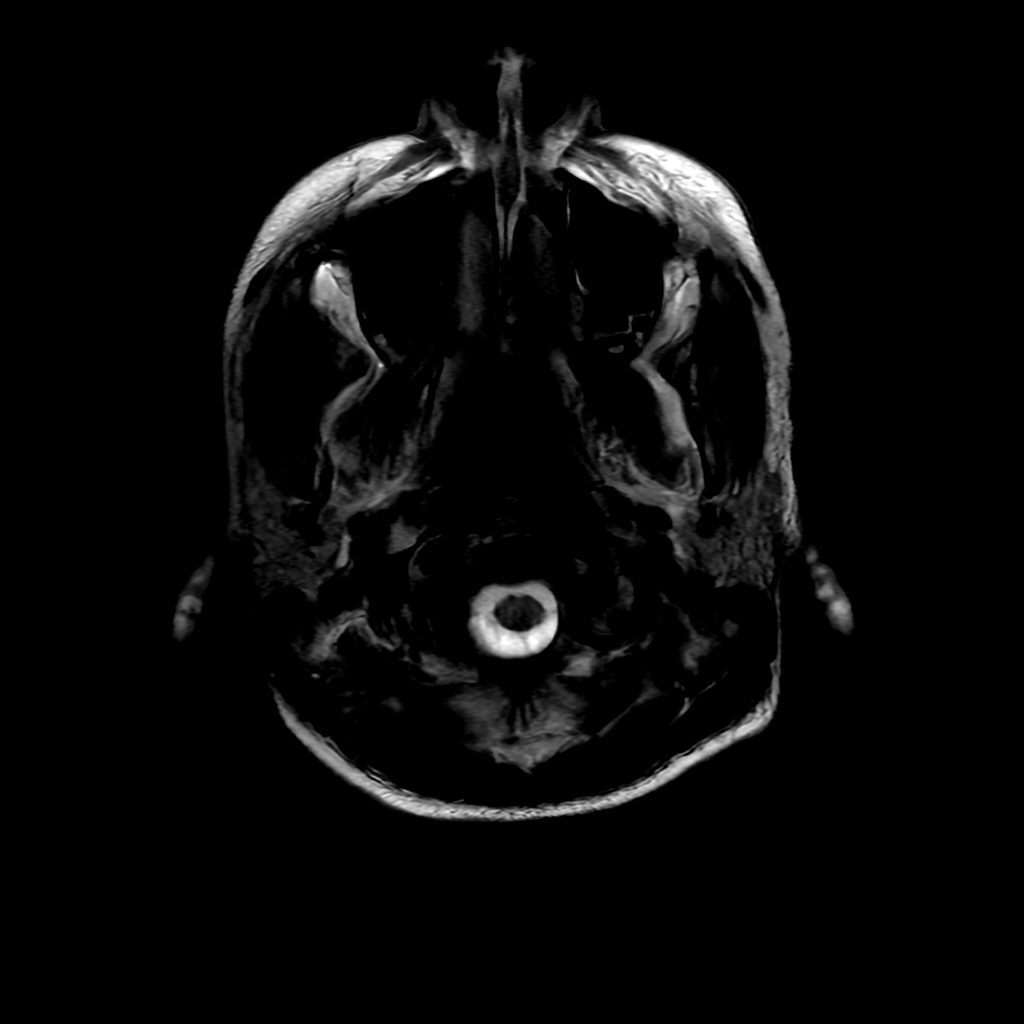

[Series 4: FLAIR · axial · 4.0mm · 0.45mm/px · z∈[-59,+80]mm · 3 of 33 slices shown (2 of 2)]
[im 1/33]
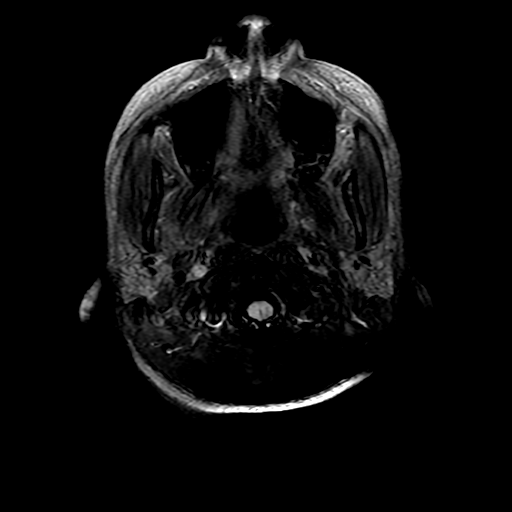
[im 17/33]
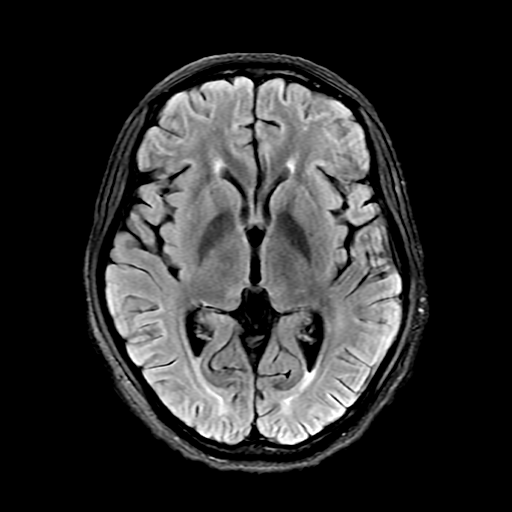
[im 33/33]
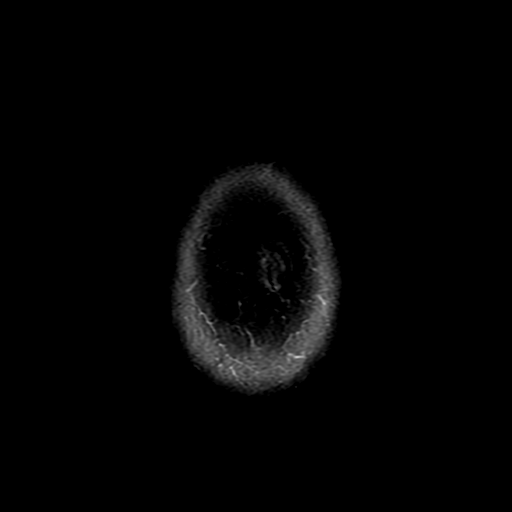

[Series 7: DWI · axial · 3.0mm · 0.94mm/px · z∈[-58,+79]mm · 8 of 94 slices shown (1 of 2)]
[im 1/94]
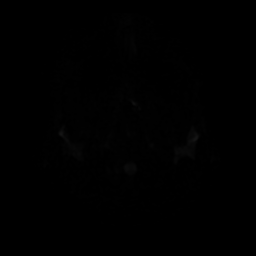
[im 14/94]
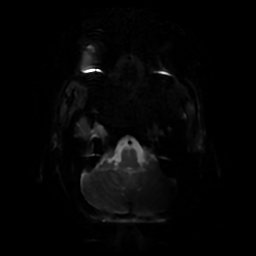
[im 27/94]
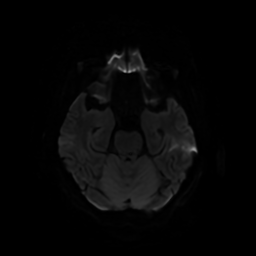
[im 40/94]
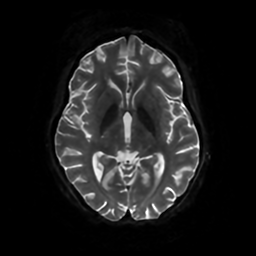
[im 54/94]
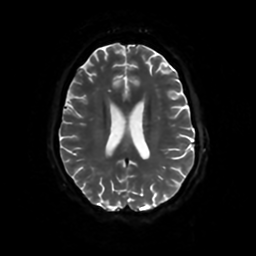
[im 67/94]
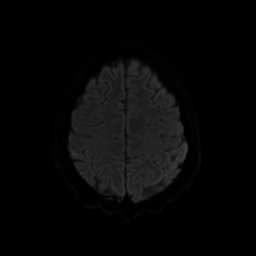
[im 80/94]
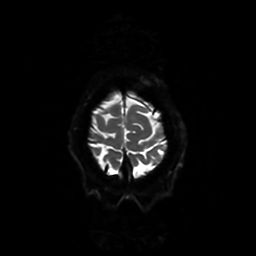
[im 94/94]
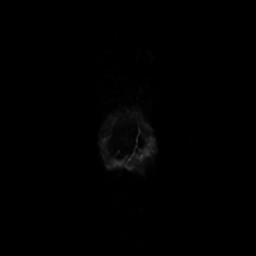

[Series 8: DWI · coronal · 4.0mm · 0.94mm/px · 6 of 70 slices shown (2 of 2)]
[im 1/70]
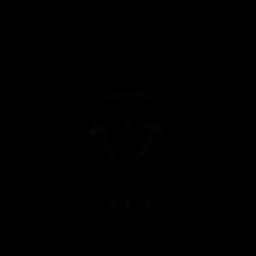
[im 14/70]
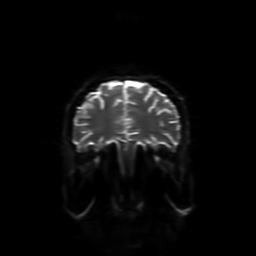
[im 28/70]
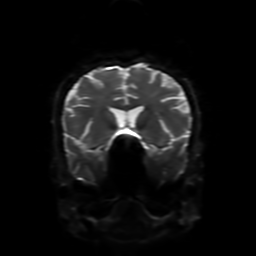
[im 42/70]
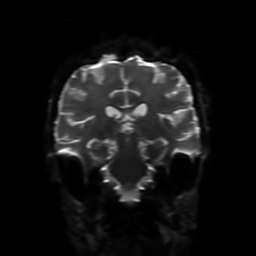
[im 56/70]
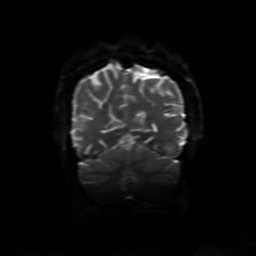
[im 70/70]
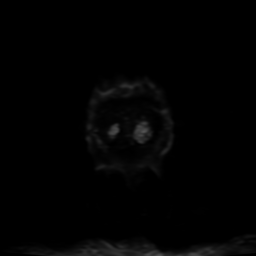

[Series 750: ADC · axial · 3.0mm · 0.94mm/px · z∈[-58,+79]mm · 4 of 45 slices shown (1 of 2)]
[im 1/45]
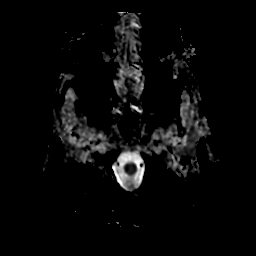
[im 15/45]
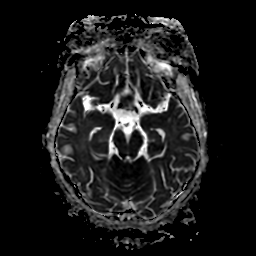
[im 30/45]
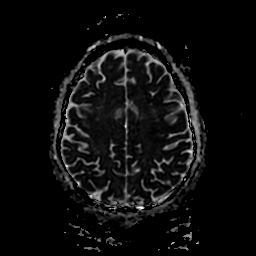
[im 45/45]
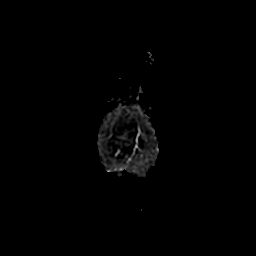

[Series 850: ADC · coronal · 4.0mm · 0.94mm/px · 3 of 35 slices shown (2 of 2)]
[im 1/35]
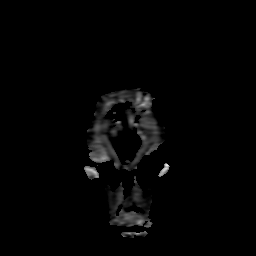
[im 18/35]
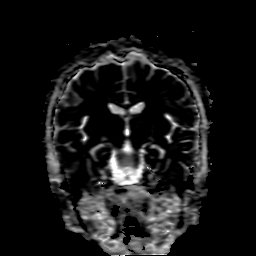
[im 35/35]
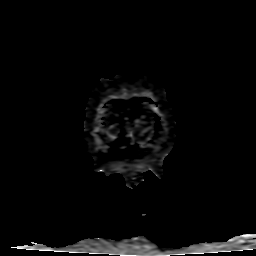

[26 of 48 positions shown; findings below may reference images not displayed]

FINDINGS: Brain: Ventricle size and cerebral volume normal. Negative for acute
infarct. Negative for hemorrhage or mass. No evidence of herpes
encephalitis.

Scattered small deep white matter hyperintensities bilaterally.
Small right frontal periventricular white matter hyperintensity.
Brainstem and cerebellum normal.

No fluid collection. Normal enhancement. Postcontrast imaging
degraded by motion. Allowing for this, no abnormal leptomeningeal
enhancement identified.

Vascular: Normal arterial flow voids.

Skull and upper cervical spine: Negative

Sinuses/Orbits: Mild mucosal edema paranasal sinuses. Mild mastoid
mucosal edema bilaterally. Negative orbit

Other: None
IMPRESSION: Multiple small white matter hyperintensities, abnormal for
25-year-old. Differential diagnosis includes demyelinating disease,
vasculitis, chronic ischemia, and other chronic white matter
inflammatory disorders. None of these enhance are exhibits
mass-effect. No areas of restricted diffusion

No enhancing lesion.  No intracranial fluid collection.

## 2023-12-01 ENCOUNTER — Encounter: Payer: Self-pay | Admitting: Emergency Medicine

## 2023-12-01 ENCOUNTER — Ambulatory Visit
Admission: EM | Admit: 2023-12-01 | Discharge: 2023-12-01 | Disposition: A | Discharge status: Home / Self Care | Attending: Physician Assistant | Admitting: Physician Assistant

## 2023-12-01 DIAGNOSIS — Z113 Encounter for screening for infections with a predominantly sexual mode of transmission: Secondary | ICD-10-CM | POA: Insufficient documentation

## 2023-12-01 NOTE — ED Triage Notes (Signed)
 Wants to be tested for STDs.  Denies any symptoms.

## 2023-12-01 NOTE — Discharge Instructions (Signed)
 Monitor your MyChart for results.  We will contact you if anything is positive.  Abstain from sex until you receive your results.  Use a condom with each sexual encounter.  If you develop any symptoms including abdominal pain, pelvic pain, fever, abnormal discharge, genital lesions you should return for reevaluation.

## 2023-12-01 NOTE — ED Provider Notes (Signed)
 [facility]    CSN: [id number] Arrival date & time: 12/01/23  1123      History   Chief Complaint No chief complaint on file.   HPI Erin French is a 28 y.o. female.   Patient presents today requesting STI testing.  She has no specific concern for exposure or any symptoms.  She denies any pelvic pain, abdominal pain, fever, nausea/vomiting, genital lesion, urinary symptoms.  She is sexually active with female partners and reports having a new partner recently prompting evaluation.  She was last tested and negative in November 2024.  She denies any recent antibiotics.  Denies any medication allergies.  She is confident that she is not pregnant.  She has no additional complaints or concerns today.    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 11/04/2022   Alpha thalassemia silent carrier 10/30/2022   H/O: C-section 10/15/2022   Supervision of high risk pregnancy, antepartum 09/11/2022   Severe major depression, single episode, without psychotic features (HCC) 09/03/2022   LGSIL on Pap smear of cervix 06/17/2019    Past Surgical History:  Procedure Laterality Date   CESAREAN SECTION N/A 11/17/2018   Procedure: CESAREAN SECTION;  Surgeon: Izell Harari, MD;  Location: [facility] LD ORS;  Service: Obstetrics;  Laterality: N/A;   INDUCED ABORTION      OB History     Gravida  6   Para  2   Term  2   Preterm      AB  3   Living  2      SAB      IAB  1   Ectopic      Multiple  0   Live Births  2            Home Medications    Prior to Admission medications   Not on File    Family History Family History  Problem Relation Age of Onset   Hypertension Father     Schizophrenia Mother    Hypertension Maternal Grandmother     Social History Social History   Tobacco Use   Smoking status: Never   Smokeless tobacco: Never  Vaping Use   Vaping status: Never Used  Substance Use Topics   Alcohol use: Not Currently    Alcohol/week: 0.0 standard drinks of alcohol    Comment: Last drink December 2019   Drug use: No     Allergies   Patient has no known allergies.   Review of Systems Review of Systems  Constitutional:  Negative for activity change, appetite change, fatigue and fever.  Gastrointestinal:  Negative for nausea and vomiting.  Genitourinary:  Negative for dysuria, frequency, genital sores, pelvic pain, urgency, vaginal bleeding, vaginal discharge and vaginal pain.     Physical Exam Triage Vital Signs ED Triage Vitals  Encounter Vitals Group     BP 12/01/23 1138 (!) 139/95     Girls Systolic BP Percentile --      Girls Diastolic BP Percentile --      Boys Systolic BP Percentile --      Boys Diastolic BP Percentile --      Pulse Rate 12/01/23 1138 82  Resp 12/01/23 1138 18     Temp 12/01/23 1138 98.6 F (37 C)     Temp Source 12/01/23 1138 Oral     SpO2 12/01/23 1138 98 %     Weight --      Height --      Head Circumference --      Peak Flow --      Pain Score 12/01/23 1139 0     Pain Loc --      Pain Education --      Exclude from Growth Chart --    No data found.  Updated Vital Signs BP (!) 139/95 (BP Location: Right Arm)   Pulse 82   Temp 98.6 F (37 C) (Oral)   Resp 18   LMP 11/10/2023 (Exact Date)   SpO2 98%   Visual Acuity Right Eye Distance:   Left Eye Distance:   Bilateral Distance:    Right Eye Near:   Left Eye Near:    Bilateral Near:     Physical Exam Vitals reviewed.  Constitutional:      General: She is awake. She is not in acute distress.    Appearance: Normal appearance. She is well-developed. She is not ill-appearing.     Comments: Very pleasant female appears stated age in no  acute distress sitting comfortably in exam room  HENT:     Head: Normocephalic and atraumatic.     Mouth/Throat:     Pharynx: Uvula midline. No oropharyngeal exudate or posterior oropharyngeal erythema.  Cardiovascular:     Rate and Rhythm: Normal rate and regular rhythm.     Heart sounds: Normal heart sounds, S1 normal and S2 normal. No murmur heard. Pulmonary:     Effort: Pulmonary effort is normal.     Breath sounds: Normal breath sounds. No wheezing, rhonchi or rales.     Comments: Clear to auscultation bilaterally Abdominal:     Palpations: Abdomen is soft.     Tenderness: There is no abdominal tenderness.  Genitourinary:    Comments: Exam deferred Psychiatric:        Behavior: Behavior is cooperative.      UC Treatments / Results  Labs (all labs ordered are listed, but only abnormal results are displayed) Labs Reviewed  RPR  HIV ANTIBODY (ROUTINE TESTING W REFLEX)  HEPATITIS C ANTIBODY  CERVICOVAGINAL ANCILLARY ONLY    EKG   Radiology No results found.  Procedures Procedures (including critical care time)  Medications Ordered in UC Medications - No data to display  Initial Impression / Assessment and Plan / UC Course  I have reviewed the triage vital signs and the nursing notes.  Pertinent labs & imaging results that were available during my care of the patient were reviewed by me and considered in my medical decision making (see chart for details).     Patient is well-appearing, afebrile, nontoxic, nontachycardic.  Vital signs and physical exam are reassuring today and no indication for emergent evaluation or imaging.  STI testing was obtained.  She has not negative hepatitis C testing in the past but she was interested in doing all testing possible and so this was added to her STI panel.  Swab for gonorrhea, chlamydia, trichomonas as well as blood testing for HIV and syphilis were obtained.  We discussed that we are unable to test for HSV at this time given  she does not have any symptoms and denies any specific exposure.  We will contact her if any of this is positive  and we need to arrange treatment.  If she develops any symptoms she is to return for reevaluation.  Strict return precautions given.  Final Clinical Impressions(s) / UC Diagnoses   Final diagnoses:  Screening examination for STI     Discharge Instructions      Monitor your MyChart for results.  We will contact you if anything is positive.  Abstain from sex until you receive your results.  Use a condom with each sexual encounter.  If you develop any symptoms including abdominal pain, pelvic pain, fever, abnormal discharge, genital lesions you should return for reevaluation.     ED Prescriptions   None    PDMP not reviewed this encounter.   Sherrell Rocky POUR, PA-C 12/01/23 1309

## 2023-12-02 LAB — CERVICOVAGINAL ANCILLARY ONLY
Chlamydia: NEGATIVE
Comment: NEGATIVE
Comment: NEGATIVE
Comment: NORMAL
Neisseria Gonorrhea: NEGATIVE
Trichomonas: NEGATIVE

## 2023-12-02 LAB — HIV ANTIBODY (ROUTINE TESTING W REFLEX): HIV Screen 4th Generation wRfx: NONREACTIVE

## 2023-12-02 LAB — RPR: RPR Ser Ql: NONREACTIVE

## 2023-12-02 LAB — HEPATITIS C ANTIBODY: Hep C Virus Ab: NONREACTIVE
